# Patient Record
Sex: Female | Born: 1945
Health system: Southern US, Community
[De-identification: ages and names within clinical notes are randomized; demographics above are authoritative.]

## PROBLEM LIST (undated history)

## (undated) DIAGNOSIS — M797 Fibromyalgia: Secondary | ICD-10-CM

## (undated) DIAGNOSIS — K219 Gastro-esophageal reflux disease without esophagitis: Secondary | ICD-10-CM

## (undated) DIAGNOSIS — H3530 Unspecified macular degeneration: Secondary | ICD-10-CM

## (undated) DIAGNOSIS — M810 Age-related osteoporosis without current pathological fracture: Secondary | ICD-10-CM

## (undated) DIAGNOSIS — J45909 Unspecified asthma, uncomplicated: Secondary | ICD-10-CM

## (undated) DIAGNOSIS — H409 Unspecified glaucoma: Secondary | ICD-10-CM

## (undated) DIAGNOSIS — J3800 Paralysis of vocal cords and larynx, unspecified: Secondary | ICD-10-CM

## (undated) DIAGNOSIS — M1990 Unspecified osteoarthritis, unspecified site: Secondary | ICD-10-CM

## (undated) DIAGNOSIS — E785 Hyperlipidemia, unspecified: Secondary | ICD-10-CM

## (undated) DIAGNOSIS — C50919 Malignant neoplasm of unspecified site of unspecified female breast: Secondary | ICD-10-CM

## (undated) DIAGNOSIS — H269 Unspecified cataract: Secondary | ICD-10-CM

## (undated) DIAGNOSIS — Z9109 Other allergy status, other than to drugs and biological substances: Secondary | ICD-10-CM

## (undated) DIAGNOSIS — Z8719 Personal history of other diseases of the digestive system: Secondary | ICD-10-CM

## (undated) DIAGNOSIS — K148 Other diseases of tongue: Secondary | ICD-10-CM

## (undated) DIAGNOSIS — K635 Polyp of colon: Secondary | ICD-10-CM

## (undated) DIAGNOSIS — M8580 Other specified disorders of bone density and structure, unspecified site: Secondary | ICD-10-CM

## (undated) DIAGNOSIS — H35039 Hypertensive retinopathy, unspecified eye: Secondary | ICD-10-CM

## (undated) DIAGNOSIS — G629 Polyneuropathy, unspecified: Secondary | ICD-10-CM

## (undated) DIAGNOSIS — T7840XA Allergy, unspecified, initial encounter: Secondary | ICD-10-CM

## (undated) DIAGNOSIS — R0989 Other specified symptoms and signs involving the circulatory and respiratory systems: Secondary | ICD-10-CM

## (undated) HISTORY — DX: Unspecified cataract: H26.9

## (undated) HISTORY — DX: Unspecified glaucoma: H40.9

## (undated) HISTORY — DX: Malignant neoplasm of unspecified site of unspecified female breast: C50.919

## (undated) HISTORY — DX: Polyp of colon: K63.5

## (undated) HISTORY — PX: APPENDECTOMY: SHX54

## (undated) HISTORY — DX: Allergy, unspecified, initial encounter: T78.40XA

## (undated) HISTORY — PX: CATARACT EXTRACTION: SUR2

## (undated) HISTORY — DX: Other allergy status, other than to drugs and biological substances: Z91.09

## (undated) HISTORY — DX: Other specified symptoms and signs involving the circulatory and respiratory systems: R09.89

## (undated) HISTORY — DX: Hyperlipidemia, unspecified: E78.5

## (undated) HISTORY — DX: Age-related osteoporosis without current pathological fracture: M81.0

## (undated) HISTORY — DX: Paralysis of vocal cords and larynx, unspecified: J38.00

## (undated) HISTORY — DX: Unspecified osteoarthritis, unspecified site: M19.90

## (undated) HISTORY — DX: Hypertensive retinopathy, unspecified eye: H35.039

## (undated) HISTORY — DX: Other specified disorders of bone density and structure, unspecified site: M85.80

## (undated) HISTORY — DX: Gastro-esophageal reflux disease without esophagitis: K21.9

## (undated) HISTORY — DX: Unspecified asthma, uncomplicated: J45.909

## (undated) HISTORY — DX: Unspecified macular degeneration: H35.30

## (undated) HISTORY — DX: Other diseases of tongue: K14.8

---

## 1978-03-01 DIAGNOSIS — M1990 Unspecified osteoarthritis, unspecified site: Secondary | ICD-10-CM

## 1978-03-01 HISTORY — DX: Unspecified osteoarthritis, unspecified site: M19.90

## 1979-03-02 HISTORY — PX: TOTAL ABDOMINAL HYSTERECTOMY: SHX209

## 1979-03-02 HISTORY — PX: APPENDECTOMY: SHX54

## 1998-03-01 DIAGNOSIS — M8580 Other specified disorders of bone density and structure, unspecified site: Secondary | ICD-10-CM

## 1998-03-01 DIAGNOSIS — J45909 Unspecified asthma, uncomplicated: Secondary | ICD-10-CM

## 1998-03-01 HISTORY — DX: Unspecified asthma, uncomplicated: J45.909

## 1998-03-01 HISTORY — DX: Other specified disorders of bone density and structure, unspecified site: M85.80

## 1998-12-03 ENCOUNTER — Other Ambulatory Visit: Admission: RE | Admit: 1998-12-03 | Discharge: 1998-12-03 | Payer: Self-pay | Admitting: Obstetrics and Gynecology

## 1999-02-24 ENCOUNTER — Encounter: Admission: RE | Admit: 1999-02-24 | Discharge: 1999-02-24 | Payer: Self-pay | Admitting: Obstetrics and Gynecology

## 1999-02-24 ENCOUNTER — Encounter: Payer: Self-pay | Admitting: Obstetrics and Gynecology

## 1999-04-30 ENCOUNTER — Emergency Department (HOSPITAL_COMMUNITY): Admission: EM | Admit: 1999-04-30 | Discharge: 1999-04-30 | Payer: Self-pay | Admitting: Emergency Medicine

## 1999-05-02 ENCOUNTER — Emergency Department (HOSPITAL_COMMUNITY): Admission: EM | Admit: 1999-05-02 | Discharge: 1999-05-02 | Payer: Self-pay | Admitting: Emergency Medicine

## 1999-05-05 ENCOUNTER — Emergency Department (HOSPITAL_COMMUNITY): Admission: EM | Admit: 1999-05-05 | Discharge: 1999-05-05 | Payer: Self-pay | Admitting: Emergency Medicine

## 1999-05-08 ENCOUNTER — Emergency Department (HOSPITAL_COMMUNITY): Admission: EM | Admit: 1999-05-08 | Discharge: 1999-05-08 | Payer: Self-pay | Admitting: Emergency Medicine

## 1999-12-17 ENCOUNTER — Other Ambulatory Visit: Admission: RE | Admit: 1999-12-17 | Discharge: 1999-12-17 | Payer: Self-pay | Admitting: Obstetrics and Gynecology

## 2000-12-27 ENCOUNTER — Other Ambulatory Visit: Admission: RE | Admit: 2000-12-27 | Discharge: 2000-12-27 | Payer: Self-pay | Admitting: Obstetrics and Gynecology

## 2001-02-20 ENCOUNTER — Ambulatory Visit (HOSPITAL_COMMUNITY): Admission: RE | Admit: 2001-02-20 | Discharge: 2001-02-20 | Payer: Self-pay | Admitting: *Deleted

## 2001-12-27 ENCOUNTER — Other Ambulatory Visit: Admission: RE | Admit: 2001-12-27 | Discharge: 2001-12-27 | Payer: Self-pay | Admitting: Gynecology

## 2003-02-19 ENCOUNTER — Other Ambulatory Visit: Admission: RE | Admit: 2003-02-19 | Discharge: 2003-02-19 | Payer: Self-pay | Admitting: Gynecology

## 2003-04-22 ENCOUNTER — Encounter: Admission: RE | Admit: 2003-04-22 | Discharge: 2003-04-22 | Payer: Self-pay | Admitting: Family Medicine

## 2003-05-21 ENCOUNTER — Encounter: Admission: RE | Admit: 2003-05-21 | Discharge: 2003-05-21 | Payer: Self-pay | Admitting: Family Medicine

## 2003-09-17 ENCOUNTER — Encounter: Admission: RE | Admit: 2003-09-17 | Discharge: 2003-09-17 | Payer: Self-pay | Admitting: Family Medicine

## 2004-03-09 ENCOUNTER — Other Ambulatory Visit: Admission: RE | Admit: 2004-03-09 | Discharge: 2004-03-09 | Payer: Self-pay | Admitting: Gynecology

## 2005-05-17 ENCOUNTER — Other Ambulatory Visit: Admission: RE | Admit: 2005-05-17 | Discharge: 2005-05-17 | Payer: Self-pay | Admitting: Gynecology

## 2008-04-17 ENCOUNTER — Encounter: Admission: RE | Admit: 2008-04-17 | Discharge: 2008-04-17 | Payer: Self-pay | Admitting: Otolaryngology

## 2008-10-29 ENCOUNTER — Encounter: Admission: RE | Admit: 2008-10-29 | Discharge: 2008-10-29 | Payer: Self-pay | Admitting: Diagnostic Neuroimaging

## 2009-07-25 ENCOUNTER — Encounter: Admission: RE | Admit: 2009-07-25 | Discharge: 2009-07-25 | Payer: Self-pay | Admitting: Neurology

## 2009-10-23 ENCOUNTER — Encounter: Admission: RE | Admit: 2009-10-23 | Discharge: 2009-10-23 | Payer: Self-pay | Admitting: Diagnostic Neuroimaging

## 2010-01-14 ENCOUNTER — Encounter: Admission: RE | Admit: 2010-01-14 | Discharge: 2010-01-14 | Payer: Self-pay | Admitting: Family Medicine

## 2010-02-19 ENCOUNTER — Encounter
Admission: RE | Admit: 2010-02-19 | Discharge: 2010-02-19 | Payer: Self-pay | Source: Home / Self Care | Attending: Family Medicine | Admitting: Family Medicine

## 2010-03-17 ENCOUNTER — Encounter: Admission: RE | Admit: 2010-03-17 | Discharge: 2010-03-17 | Payer: Self-pay | Source: Home / Self Care

## 2010-03-22 ENCOUNTER — Encounter: Payer: Self-pay | Admitting: Family Medicine

## 2011-03-02 DIAGNOSIS — K219 Gastro-esophageal reflux disease without esophagitis: Secondary | ICD-10-CM

## 2011-03-02 DIAGNOSIS — T7840XA Allergy, unspecified, initial encounter: Secondary | ICD-10-CM

## 2011-03-02 DIAGNOSIS — J3800 Paralysis of vocal cords and larynx, unspecified: Secondary | ICD-10-CM

## 2011-03-02 DIAGNOSIS — G629 Polyneuropathy, unspecified: Secondary | ICD-10-CM

## 2011-03-02 DIAGNOSIS — K148 Other diseases of tongue: Secondary | ICD-10-CM

## 2011-03-02 DIAGNOSIS — Z9109 Other allergy status, other than to drugs and biological substances: Secondary | ICD-10-CM

## 2011-03-02 HISTORY — DX: Polyneuropathy, unspecified: G62.9

## 2011-03-02 HISTORY — DX: Allergy, unspecified, initial encounter: T78.40XA

## 2011-03-02 HISTORY — DX: Gastro-esophageal reflux disease without esophagitis: K21.9

## 2011-03-02 HISTORY — DX: Paralysis of vocal cords and larynx, unspecified: J38.00

## 2011-03-02 HISTORY — DX: Other diseases of tongue: K14.8

## 2011-03-02 HISTORY — DX: Other allergy status, other than to drugs and biological substances: Z91.09

## 2011-05-19 ENCOUNTER — Encounter: Payer: Self-pay | Admitting: Gastroenterology

## 2011-07-09 ENCOUNTER — Other Ambulatory Visit: Payer: Self-pay | Admitting: Gastroenterology

## 2011-08-26 ENCOUNTER — Encounter: Payer: Self-pay | Admitting: Gastroenterology

## 2011-10-13 ENCOUNTER — Other Ambulatory Visit: Payer: Self-pay | Admitting: Family Medicine

## 2011-10-13 DIAGNOSIS — R1310 Dysphagia, unspecified: Secondary | ICD-10-CM

## 2011-10-14 ENCOUNTER — Ambulatory Visit (AMBULATORY_SURGERY_CENTER): Payer: Medicare Other | Admitting: *Deleted

## 2011-10-14 VITALS — Ht 62.0 in | Wt 159.7 lb

## 2011-10-14 DIAGNOSIS — Z1211 Encounter for screening for malignant neoplasm of colon: Secondary | ICD-10-CM

## 2011-10-14 MED ORDER — MOVIPREP 100 G PO SOLR: ORAL | Status: DC

## 2011-10-21 ENCOUNTER — Other Ambulatory Visit: Payer: Self-pay

## 2011-10-28 ENCOUNTER — Encounter: Payer: Self-pay | Admitting: Gastroenterology

## 2011-10-28 ENCOUNTER — Ambulatory Visit (AMBULATORY_SURGERY_CENTER): Payer: Medicare Other | Admitting: Gastroenterology

## 2011-10-28 VITALS — BP 129/84 | HR 80 | Temp 96.9°F | Resp 18 | Ht 62.0 in | Wt 159.0 lb

## 2011-10-28 DIAGNOSIS — D126 Benign neoplasm of colon, unspecified: Secondary | ICD-10-CM

## 2011-10-28 DIAGNOSIS — Z1211 Encounter for screening for malignant neoplasm of colon: Secondary | ICD-10-CM

## 2011-10-28 MED ORDER — SODIUM CHLORIDE 0.9 % IV SOLN: 500.0000 mL | INTRAVENOUS | Status: DC

## 2011-10-28 NOTE — Progress Notes (Signed)
Patient did not experience any of the following events: a burn prior to discharge; a fall within the facility; wrong site/side/patient/procedure/implant event; or a hospital transfer or hospital admission upon discharge from the facility. (G8907) Patient did not have preoperative order for IV antibiotic SSI prophylaxis. (G8918)  

## 2011-10-28 NOTE — Patient Instructions (Addendum)
YOU HAD AN ENDOSCOPIC PROCEDURE TODAY AT THE [facility]: Refer to the procedure report that was given to you for any specific questions about what was found during the examination.  If the procedure report does not answer your questions, please call your gastroenterologist to clarify.  If you requested that your care partner not be given the details of your procedure findings, then the procedure report has been included in a sealed envelope for you to review at your convenience later.  YOU SHOULD EXPECT: Some feelings of bloating in the abdomen. Passage of more gas than usual.  Walking can help get rid of the air that was put into your GI tract during the procedure and reduce the bloating. If you had a lower endoscopy (such as a colonoscopy or flexible sigmoidoscopy) you may notice spotting of blood in your stool or on the toilet paper. If you underwent a bowel prep for your procedure, then you may not have a normal bowel movement for a few days.  DIET: Your first meal following the procedure should be a light meal and then it is ok to progress to your normal diet.  A half-sandwich or bowl of soup is an example of a good first meal.  Heavy or fried foods are harder to digest and may make you feel nauseous or bloated.  Likewise meals heavy in dairy and vegetables can cause extra gas to form and this can also increase the bloating.  Drink plenty of fluids but you should avoid alcoholic beverages for 24 hours.  ACTIVITY: Your care partner should take you home directly after the procedure.  You should plan to take it easy, moving slowly for the rest of the day.  You can resume normal activity the day after the procedure however you should NOT DRIVE or use heavy machinery for 24 hours (because of the sedation medicines used during the test).    SYMPTOMS TO REPORT IMMEDIATELY: A gastroenterologist can be reached at any hour.  During normal business hours, 8:30 AM to 5:00 PM Monday through Friday,  call [phone].  After hours and on weekends, please call the GI answering service at [phone] who will take a message and have the physician on call contact you.   Following lower endoscopy (colonoscopy or flexible sigmoidoscopy):  Excessive amounts of blood in the stool  Significant tenderness or worsening of abdominal pains  Swelling of the abdomen that is new, acute  Fever of 100F or higher   FOLLOW UP: If any biopsies were taken you will be contacted by phone or by letter within the next 1-3 weeks.  Call your gastroenterologist if you have not heard about the biopsies in 3 weeks.  Our staff will call the home number listed on your records the next business day following your procedure to check on you and address any questions or concerns that you may have at that time regarding the information given to you following your procedure. This is a courtesy call and so if there is no answer at the home number and we have not heard from you through the emergency physician on call, we will assume that you have returned to your regular daily activities without incident.  SIGNATURES/CONFIDENTIALITY: You and/or your care partner have signed paperwork which will be entered into your electronic medical record.  These signatures attest to the fact that that the information above on your After Visit Summary has been reviewed and is understood.  Full responsibility of the confidentiality of   this discharge information lies with you and/or your care-partner.   Resume medications. Information given on polyps,diverticulosis and high fiber diet with discharge instructions. 

## 2011-10-28 NOTE — Op Note (Signed)
[facility] [address] [place], [address]   COLONOSCOPY PROCEDURE REPORT  PATIENT: Ruth, Wolfe  MR#: [id number] BIRTHDATE: 1945-06-04 , 65  yrs. old GENDER: Female ENDOSCOPIST: Meryl Dare, MD, [facility] REFERRED NF:AOZHY Duaine Dredge, M.D. PROCEDURE DATE:  10/28/2011 PROCEDURE:   Colonoscopy with biopsy ASA CLASS:   Class II INDICATIONS: average risk screening. MEDICATIONS: propofol (Diprivan) 400mg  IV DESCRIPTION OF PROCEDURE:   After the risks benefits and alternatives of the procedure were thoroughly explained, informed consent was obtained.  A digital rectal exam revealed no abnormalities of the rectum.   The LB [id number]  endoscope was introduced through the anus and advanced to the cecum, which was identified by both the appendix and ileocecal valve. The sigmoid colon was tortuous, fixed and was moderately difficult to traverse. No adverse events experienced.   The quality of the prep was good, using MoviPrep  The instrument was then slowly withdrawn as the colon was fully examined.   COLON FINDINGS: A sessile polyp was found in the ascending colon.  A polypectomy was performed with cold forceps.  The resection was complete and the polyp tissue was completely retrieved.   Moderate diverticulosis and tortuosity was noted in the sigmoid colon.   The colon mucosa was otherwise normal.  Retroflexed views revealed small internal hemorrhoids. The time to cecum=13 minutes 13 seconds.  Withdrawal time=10 minutes 00 seconds.  The scope was withdrawn and the procedure completed. COMPLICATIONS: There were no complications.  ENDOSCOPIC IMPRESSION: 1.   Sessile polyp was found in the ascending colon; polypectomy was performed with cold forceps 2.   Moderate diverticulosis was noted in the sigmoid colon 3.   The colon mucosa was otherwise normal  RECOMMENDATIONS: 1.  Await pathology results 2.  High fiber diet with liberal fluid intake 3.  Repeat  colonoscopy in 5 years if polyp adenomatous; otherwise 10 years and consider VC given sigmoid colon anatomy   eSigned:  Meryl Dare, MD, [facility] 10/28/2011 10:30 AM

## 2011-10-28 NOTE — Progress Notes (Signed)
Propofol per m smith crna, all meds titrated per crna during procedure. See scanned intra procedure report. ewm 

## 2011-10-29 ENCOUNTER — Telehealth: Payer: Self-pay | Admitting: *Deleted

## 2011-10-29 NOTE — Telephone Encounter (Signed)
  Follow up Call-  Call back number 10/28/2011  Post procedure Call Back phone  # [phone]  Permission to leave phone message Yes     Patient questions:  Do you have a fever, pain , or abdominal swelling? no Pain Score  0 *  Have you tolerated food without any problems? yes  Have you been able to return to your normal activities? yes  Do you have any questions about your discharge instructions: Diet   no Medications  no Follow up visit  yes  Do you have questions or concerns about your Care? yes  Actions: * If pain score is 4 or above: No action needed, pain <4.  Pt had questions about recommendations on her proc report.  Explained what a tortuous colon is and discussed virtual colonoscopy.

## 2011-11-04 ENCOUNTER — Encounter: Payer: Self-pay | Admitting: Gastroenterology

## 2011-11-18 ENCOUNTER — Ambulatory Visit
Admission: RE | Admit: 2011-11-18 | Discharge: 2011-11-18 | Disposition: A | Discharge status: Home / Self Care | Payer: Medicare Other | Source: Ambulatory Visit | Attending: Family Medicine | Admitting: Family Medicine

## 2011-11-18 DIAGNOSIS — R1310 Dysphagia, unspecified: Secondary | ICD-10-CM

## 2011-12-06 DIAGNOSIS — J3800 Paralysis of vocal cords and larynx, unspecified: Secondary | ICD-10-CM | POA: Insufficient documentation

## 2011-12-06 DIAGNOSIS — R479 Unspecified speech disturbances: Secondary | ICD-10-CM | POA: Insufficient documentation

## 2012-02-28 ENCOUNTER — Other Ambulatory Visit: Payer: Self-pay | Admitting: Family Medicine

## 2012-02-28 DIAGNOSIS — G527 Disorders of multiple cranial nerves: Secondary | ICD-10-CM

## 2012-03-01 DIAGNOSIS — E785 Hyperlipidemia, unspecified: Secondary | ICD-10-CM

## 2012-03-01 DIAGNOSIS — H3530 Unspecified macular degeneration: Secondary | ICD-10-CM

## 2012-03-01 HISTORY — DX: Unspecified macular degeneration: H35.30

## 2012-03-01 HISTORY — DX: Hyperlipidemia, unspecified: E78.5

## 2012-03-03 ENCOUNTER — Ambulatory Visit
Admission: RE | Admit: 2012-03-03 | Discharge: 2012-03-03 | Disposition: A | Discharge status: Home / Self Care | Payer: Medicare Other | Source: Ambulatory Visit | Attending: Family Medicine | Admitting: Family Medicine

## 2012-03-03 DIAGNOSIS — G527 Disorders of multiple cranial nerves: Secondary | ICD-10-CM

## 2012-03-03 MED ORDER — GADOBENATE DIMEGLUMINE 529 MG/ML IV SOLN
14.0000 mL | Freq: Once | INTRAVENOUS | Status: AC | PRN
  Administered 2012-03-03: 14 mL via INTRAVENOUS

## 2012-10-05 ENCOUNTER — Ambulatory Visit (INDEPENDENT_AMBULATORY_CARE_PROVIDER_SITE_OTHER): Payer: Medicare Other | Admitting: Family Medicine

## 2012-10-05 VITALS — BP 130/76 | HR 84 | Temp 97.9°F | Resp 16 | Ht 62.3 in | Wt 154.2 lb

## 2012-10-05 DIAGNOSIS — H6123 Impacted cerumen, bilateral: Secondary | ICD-10-CM

## 2012-10-05 DIAGNOSIS — H9193 Unspecified hearing loss, bilateral: Secondary | ICD-10-CM

## 2012-10-05 NOTE — Progress Notes (Signed)
[facility]:  Office Visit  Chief Complaint:  Chief Complaint  Patient presents with  . Cerumen Impaction    has been using debrox but it seems like it has made it worse    HPI: Ruth Wolfe is a 67 y.o. female who complains of  Right ear and also Left ear wax, right ear hurts, tried debrox for 2 weeks without relief. No other sxs. .   Past Medical History  Diagnosis Date  . Asthma   . Environmental allergies   . Hyperlipidemia   . GERD (gastroesophageal reflux disease)   . Glaucoma     pre-Glaucoma  . Osteopenia    Past Surgical History  Procedure Laterality Date  . Total abdominal hysterectomy  1981   History   Social History  . Marital Status: Divorced    Spouse Name: N/A    Number of Children: N/A  . Years of Education: N/A   Social History Main Topics  . Smoking status: Never Smoker   . Smokeless tobacco: Never Used  . Alcohol Use: 3 - 3.6 oz/week    5-6 Cans of beer per week  . Drug Use: No  . Sexually Active: None   Other Topics Concern  . None   Social History Narrative  . None   Family History  Problem Relation Age of Onset  . Colon cancer Neg Hx   . Stomach cancer Neg Hx   . Lung cancer Father    Allergies  Allergen Reactions  . Sulfa Antibiotics     Wheezing, swelling face and ears   Prior to Admission medications   Medication Sig Start Date End Date Taking? Authorizing Provider  albuterol (PROAIR HFA) 108 (90 BASE) MCG/ACT inhaler Inhale 2 puffs into the lungs every 6 (six) hours as needed for wheezing.   Yes Historical Provider, MD  aspirin 81 MG tablet Take 81 mg by mouth daily.   Yes Historical Provider, MD  Cholecalciferol (VITAMIN D) 2000 UNITS CAPS Take by mouth daily.   Yes Historical Provider, MD  latanoprost (XALATAN) 0.005 % ophthalmic solution Place 1 drop into both eyes at bedtime.  10/07/11  Yes Historical Provider, MD  Omega-3 Fatty Acids (FISH OIL) 1200 MG CAPS Take by mouth 2 (two) times daily.   Yes  Historical Provider, MD  PULMICORT FLEXHALER 180 MCG/ACT inhaler Inhale 1 puff into the lungs 2 (two) times daily.  08/18/11  Yes Historical Provider, MD  simvastatin (ZOCOR) 80 MG tablet Take 80 mg by mouth at bedtime.   Yes Historical Provider, MD  pravastatin (PRAVACHOL) 80 MG tablet Take 80 mg by mouth daily.  09/08/11   Historical Provider, MD  UNABLE TO FIND Med Name: Macular Degeneration Supplement. 1 tablet twice daily    Historical Provider, MD  VENTOLIN HFA 108 (90 BASE) MCG/ACT inhaler Inhale 2 puffs into the lungs 2 (two) times daily.  08/05/11   Historical Provider, MD     ROS: The patient denies fevers, chills, night sweats, unintentional weight loss, chest pain, palpitations, wheezing, dyspnea on exertion, nausea, vomiting, abdominal pain, dysuria, hematuria, melena, numbness, weakness, or tingling.  All other systems have been reviewed and were otherwise negative with the exception of those mentioned in the HPI and as above.    PHYSICAL EXAM: Filed Vitals:   10/05/12 0903  BP: 130/76  Pulse: 84  Temp: 97.9 F (36.6 C)  Resp: 16   Filed Vitals:   10/05/12 0903  Height: 5' 2.3" (1.582 m)  Weight: 154 lb 3.2 oz (69.945 kg)   Body mass index is 27.95 kg/(m^2).  General: Alert, no acute distress HEENT:  Normocephalic, atraumatic, oropharynx patent. bilaterl cermen obscuring TM Cardiovascular:  Regular rate and rhythm, no rubs murmurs or gallops.  No Carotid bruits, radial pulse intact. No pedal edema.  Respiratory: Clear to auscultation bilaterally.  No wheezes, rales, or rhonchi.  No cyanosis, no use of accessory musculature GI: No organomegaly, abdomen is soft and non-tender, positive bowel sounds.  No masses. Skin: No rashes. Neurologic: Facial musculature symmetric. Psychiatric: Patient is appropriate throughout our interaction. Lymphatic: No cervical lymphadenopathy Musculoskeletal: Gait intact.   LABS: No results found for this or any previous  visit.   EKG/XRAY:   Primary read interpreted by Dr. Conley Rolls at [facility].   ASSESSMENT/PLAN: Encounter Diagnoses  Name Primary?  . Cerumen impaction, bilateral Yes  . Hearing loss, bilateral    Whisper test normal after cerumen disimpaction TM was visualized Cerumen removed successfully F/u prn    Anabeth Chilcott PHUONG, DO 10/05/2012 9:41 AM

## 2012-10-26 ENCOUNTER — Ambulatory Visit (INDEPENDENT_AMBULATORY_CARE_PROVIDER_SITE_OTHER): Payer: Medicare Other | Admitting: Family Medicine

## 2012-10-26 VITALS — BP 122/82 | HR 96 | Temp 98.1°F | Resp 16 | Ht 62.0 in | Wt 151.0 lb

## 2012-10-26 DIAGNOSIS — L237 Allergic contact dermatitis due to plants, except food: Secondary | ICD-10-CM

## 2012-10-26 DIAGNOSIS — L255 Unspecified contact dermatitis due to plants, except food: Secondary | ICD-10-CM

## 2012-10-26 MED ORDER — METHYLPREDNISOLONE ACETATE 80 MG/ML IJ SUSP
80.0000 mg | Freq: Once | INTRAMUSCULAR | Status: AC
  Administered 2012-10-26: 80 mg via INTRAMUSCULAR

## 2012-10-26 MED ORDER — PREDNISONE 10 MG PO TABS: ORAL_TABLET | ORAL | Status: DC

## 2012-10-26 NOTE — Progress Notes (Signed)
[facility]:  Office Visit  Chief Complaint:  Chief Complaint  Patient presents with  . Poison Ivy    HPI: Ruth Wolfe is a 67 y.o. female who complains of here for posion ivy on face and on finger, has tried sea breeze and also nozema without relief of itching, alos on the back of her ear and right eyelid.  She deneis SOB, CP, wheezing, changes.  She has pre-glaucoma, her eye pressure is 12 and she does not have glaucoma  Past Medical History  Diagnosis Date  . Asthma   . Environmental allergies   . Hyperlipidemia   . GERD (gastroesophageal reflux disease)   . Glaucoma     pre-Glaucoma  . Osteopenia   . Allergy    Past Surgical History  Procedure Laterality Date  . Total abdominal hysterectomy  1981  . Appendectomy     History   Social History  . Marital Status: Divorced    Spouse Name: N/A    Number of Children: N/A  . Years of Education: N/A   Social History Main Topics  . Smoking status: Never Smoker   . Smokeless tobacco: Never Used  . Alcohol Use: 3 - 3.6 oz/week    5-6 Cans of beer per week  . Drug Use: No  . Sexual Activity: None   Other Topics Concern  . None   Social History Narrative  . None   Family History  Problem Relation Age of Onset  . Colon cancer Neg Hx   . Stomach cancer Neg Hx   . Lung cancer Father    Allergies  Allergen Reactions  . Sulfa Antibiotics     Wheezing, swelling face and ears   Prior to Admission medications   Medication Sig Start Date End Date Taking? Authorizing Provider  albuterol (PROAIR HFA) 108 (90 BASE) MCG/ACT inhaler Inhale 2 puffs into the lungs every 6 (six) hours as needed for wheezing.   Yes Historical Provider, MD  aspirin 81 MG tablet Take 81 mg by mouth daily.   Yes Historical Provider, MD  Cholecalciferol (VITAMIN D) 2000 UNITS CAPS Take by mouth daily.   Yes Historical Provider, MD  latanoprost (XALATAN) 0.005 % ophthalmic solution Place 1 drop into both eyes at bedtime.   10/07/11  Yes Historical Provider, MD  Omega-3 Fatty Acids (FISH OIL) 1200 MG CAPS Take by mouth 2 (two) times daily.   Yes Historical Provider, MD  PULMICORT FLEXHALER 180 MCG/ACT inhaler Inhale 1 puff into the lungs 2 (two) times daily.  08/18/11  Yes Historical Provider, MD  simvastatin (ZOCOR) 80 MG tablet Take 80 mg by mouth at bedtime.   Yes Historical Provider, MD  UNABLE TO FIND Med Name: Macular Degeneration Supplement. 1 tablet twice daily   Yes Historical Provider, MD  pravastatin (PRAVACHOL) 80 MG tablet Take 80 mg by mouth daily.  09/08/11   Historical Provider, MD  VENTOLIN HFA 108 (90 BASE) MCG/ACT inhaler Inhale 2 puffs into the lungs 2 (two) times daily.  08/05/11   Historical Provider, MD     ROS: The patient denies fevers, chills, night sweats, unintentional weight loss, chest pain, palpitations, wheezing, dyspnea on exertion, nausea, vomiting, abdominal pain, dysuria, hematuria, melena, numbness, weakness, or tingling.   All other systems have been reviewed and were otherwise negative with the exception of those mentioned in the HPI and as above.    PHYSICAL EXAM: Filed Vitals:   10/26/12 0822  BP: 122/82  Pulse: 96  Temp: 98.1 F (36.7 C)  Resp: 16   Filed Vitals:   10/26/12 0822  Height: 5\' 2"  (1.575 m)  Weight: 151 lb (68.493 kg)   Body mass index is 27.61 kg/(m^2).  General: Alert, no acute distress HEENT:  Normocephalic, atraumatic, oropharynx patent. EOMI, PERRLA. Airway is open Cardiovascular:  Regular rate and rhythm, no rubs murmurs or gallops.  No Carotid bruits, radial pulse intact. No pedal edema.  Respiratory: Clear to auscultation bilaterally.  No wheezes, rales, or rhonchi.  No cyanosis, no use of accessory musculature GI: No organomegaly, abdomen is soft and non-tender, positive bowel sounds.  No masses. Skin: + erythematous rash on right side of face, edema and also behind ears and on left hand Neurologic: Facial musculature symmetric. Psychiatric:  Patient is appropriate throughout our interaction. Lymphatic: No cervical lymphadenopathy Musculoskeletal: Gait intact.   LABS: No results found for this or any previous visit.   EKG/XRAY:   Primary read interpreted by Dr. Conley Rolls at [facility].   ASSESSMENT/PLAN: Encounter Diagnosis  Name Primary?  . Poison ivy dermatitis Yes   Patient given Depomedrol 80 mg IM x 1 Antihistamine Zyrtec/Benadryl Rx Prednisone taper ( low dose)  She will go see her optometerist for check on her eyes next week as scheduled  Gross sideeffects, risk and benefits, and alternatives of medications d/w patient. Patient is aware that all medications have potential sideeffects and we are unable to predict every sideeffect or drug-drug interaction that may occur.  Hamilton Capri PHUONG, DO 10/26/2012 9:54 AM

## 2012-10-26 NOTE — Patient Instructions (Signed)
IF YOU ARE GOING OUT AND DO NOT WANT TO BE DROWSY--- TAKE ZYRTEC 10 mg DAILY; YOU MAY ALSO TAKE BENADRYL 25 mg at NIGHT AS NEEDED   Poison [facility] ivy is a inflammation of the skin (contact dermatitis) caused by touching the allergens on the leaves of the ivy plant following previous exposure to the plant. The rash usually appears 48 hours after exposure. The rash is usually bumps (papules) or blisters (vesicles) in a linear pattern. Depending on your own sensitivity, the rash may simply cause redness and itching, or it may also progress to blisters which may break open. These must be well cared for to prevent secondary bacterial (germ) infection, followed by scarring. Keep any open areas dry, clean, dressed, and covered with an antibacterial ointment if needed. The eyes may also get puffy. The puffiness is worst in the morning and gets better as the day progresses. This dermatitis usually heals without scarring, within 2 to 3 weeks without treatment. HOME CARE INSTRUCTIONS  Thoroughly wash with soap and water as soon as you have been exposed to poison ivy. You have about one half hour to remove the plant resin before it will cause the rash. This washing will destroy the oil or antigen on the skin that is causing, or will cause, the rash. Be sure to wash under your fingernails as any plant resin there will continue to spread the rash. Do not rub skin vigorously when washing affected area. Poison ivy cannot spread if no oil from the plant remains on your body. A rash that has progressed to weeping sores will not spread the rash unless you have not washed thoroughly. It is also important to wash any clothes you have been wearing as these may carry active allergens. The rash will return if you wear the unwashed clothing, even several days later. Avoidance of the plant in the future is the best measure. Poison ivy plant can be recognized by the number of leaves. Generally, poison ivy has three leaves with  flowering branches on a single stem. Diphenhydramine may be purchased over the counter and used as needed for itching. Do not drive with this medication if it makes you drowsy.Ask your caregiver about medication for children. SEEK MEDICAL CARE IF:  Open sores develop.  Redness spreads beyond area of rash.  You notice purulent (pus-like) discharge.  You have increased pain.  Other signs of infection develop (such as fever). Document Released: 02/13/2000 Document Revised: 05/10/2011 Document Reviewed: 01/01/2009 [facility] Patient Information 2014 [place], [place].

## 2013-04-20 DIAGNOSIS — H04129 Dry eye syndrome of unspecified lacrimal gland: Secondary | ICD-10-CM | POA: Diagnosis not present

## 2013-04-20 DIAGNOSIS — H4011X0 Primary open-angle glaucoma, stage unspecified: Secondary | ICD-10-CM | POA: Diagnosis not present

## 2013-04-20 DIAGNOSIS — H409 Unspecified glaucoma: Secondary | ICD-10-CM | POA: Diagnosis not present

## 2013-05-09 DIAGNOSIS — Z1231 Encounter for screening mammogram for malignant neoplasm of breast: Secondary | ICD-10-CM | POA: Diagnosis not present

## 2013-05-15 DIAGNOSIS — IMO0001 Reserved for inherently not codable concepts without codable children: Secondary | ICD-10-CM | POA: Diagnosis not present

## 2013-05-15 DIAGNOSIS — E782 Mixed hyperlipidemia: Secondary | ICD-10-CM | POA: Diagnosis not present

## 2013-05-15 DIAGNOSIS — J45909 Unspecified asthma, uncomplicated: Secondary | ICD-10-CM | POA: Diagnosis not present

## 2013-05-15 DIAGNOSIS — R0989 Other specified symptoms and signs involving the circulatory and respiratory systems: Secondary | ICD-10-CM | POA: Diagnosis not present

## 2013-05-15 DIAGNOSIS — Z23 Encounter for immunization: Secondary | ICD-10-CM | POA: Diagnosis not present

## 2013-07-06 DIAGNOSIS — J45901 Unspecified asthma with (acute) exacerbation: Secondary | ICD-10-CM | POA: Diagnosis not present

## 2013-07-06 DIAGNOSIS — J029 Acute pharyngitis, unspecified: Secondary | ICD-10-CM | POA: Diagnosis not present

## 2013-07-06 DIAGNOSIS — R0982 Postnasal drip: Secondary | ICD-10-CM | POA: Diagnosis not present

## 2013-09-17 DIAGNOSIS — R0989 Other specified symptoms and signs involving the circulatory and respiratory systems: Secondary | ICD-10-CM | POA: Diagnosis not present

## 2013-09-17 DIAGNOSIS — E782 Mixed hyperlipidemia: Secondary | ICD-10-CM | POA: Diagnosis not present

## 2013-09-17 DIAGNOSIS — J45909 Unspecified asthma, uncomplicated: Secondary | ICD-10-CM | POA: Diagnosis not present

## 2013-09-17 DIAGNOSIS — IMO0001 Reserved for inherently not codable concepts without codable children: Secondary | ICD-10-CM | POA: Diagnosis not present

## 2013-10-29 DIAGNOSIS — H4011X0 Primary open-angle glaucoma, stage unspecified: Secondary | ICD-10-CM | POA: Diagnosis not present

## 2013-10-29 DIAGNOSIS — H353190 Nonexudative age-related macular degeneration, unspecified eye, stage unspecified: Secondary | ICD-10-CM | POA: Diagnosis not present

## 2013-10-29 DIAGNOSIS — H409 Unspecified glaucoma: Secondary | ICD-10-CM | POA: Diagnosis not present

## 2013-10-29 DIAGNOSIS — H524 Presbyopia: Secondary | ICD-10-CM | POA: Diagnosis not present

## 2013-11-19 DIAGNOSIS — E782 Mixed hyperlipidemia: Secondary | ICD-10-CM | POA: Diagnosis not present

## 2014-01-07 DIAGNOSIS — J4540 Moderate persistent asthma, uncomplicated: Secondary | ICD-10-CM | POA: Diagnosis not present

## 2014-01-07 DIAGNOSIS — Z6827 Body mass index (BMI) 27.0-27.9, adult: Secondary | ICD-10-CM | POA: Diagnosis not present

## 2014-01-07 DIAGNOSIS — G2581 Restless legs syndrome: Secondary | ICD-10-CM | POA: Diagnosis not present

## 2014-01-07 DIAGNOSIS — E782 Mixed hyperlipidemia: Secondary | ICD-10-CM | POA: Diagnosis not present

## 2014-01-16 DIAGNOSIS — E782 Mixed hyperlipidemia: Secondary | ICD-10-CM | POA: Diagnosis not present

## 2014-01-16 DIAGNOSIS — Z0000 Encounter for general adult medical examination without abnormal findings: Secondary | ICD-10-CM | POA: Diagnosis not present

## 2014-01-16 DIAGNOSIS — G2581 Restless legs syndrome: Secondary | ICD-10-CM | POA: Diagnosis not present

## 2014-01-16 DIAGNOSIS — J4540 Moderate persistent asthma, uncomplicated: Secondary | ICD-10-CM | POA: Diagnosis not present

## 2014-01-17 DIAGNOSIS — E2839 Other primary ovarian failure: Secondary | ICD-10-CM | POA: Diagnosis not present

## 2014-01-17 DIAGNOSIS — M8588 Other specified disorders of bone density and structure, other site: Secondary | ICD-10-CM | POA: Diagnosis not present

## 2014-02-20 DIAGNOSIS — Z85828 Personal history of other malignant neoplasm of skin: Secondary | ICD-10-CM | POA: Diagnosis not present

## 2014-02-20 DIAGNOSIS — L814 Other melanin hyperpigmentation: Secondary | ICD-10-CM | POA: Diagnosis not present

## 2014-02-20 DIAGNOSIS — L821 Other seborrheic keratosis: Secondary | ICD-10-CM | POA: Diagnosis not present

## 2014-02-20 DIAGNOSIS — D1801 Hemangioma of skin and subcutaneous tissue: Secondary | ICD-10-CM | POA: Diagnosis not present

## 2014-02-20 DIAGNOSIS — D239 Other benign neoplasm of skin, unspecified: Secondary | ICD-10-CM | POA: Diagnosis not present

## 2014-02-20 DIAGNOSIS — L570 Actinic keratosis: Secondary | ICD-10-CM | POA: Diagnosis not present

## 2014-03-01 DIAGNOSIS — H409 Unspecified glaucoma: Secondary | ICD-10-CM

## 2014-03-01 HISTORY — DX: Unspecified glaucoma: H40.9

## 2014-04-24 DIAGNOSIS — H409 Unspecified glaucoma: Secondary | ICD-10-CM | POA: Diagnosis not present

## 2014-04-24 DIAGNOSIS — H4011X0 Primary open-angle glaucoma, stage unspecified: Secondary | ICD-10-CM | POA: Diagnosis not present

## 2014-05-03 DIAGNOSIS — D485 Neoplasm of uncertain behavior of skin: Secondary | ICD-10-CM | POA: Diagnosis not present

## 2014-05-03 DIAGNOSIS — H6122 Impacted cerumen, left ear: Secondary | ICD-10-CM | POA: Diagnosis not present

## 2014-05-06 DIAGNOSIS — L905 Scar conditions and fibrosis of skin: Secondary | ICD-10-CM | POA: Diagnosis not present

## 2014-05-10 ENCOUNTER — Ambulatory Visit (INDEPENDENT_AMBULATORY_CARE_PROVIDER_SITE_OTHER): Payer: Medicare Other | Admitting: Family Medicine

## 2014-05-10 VITALS — BP 112/70 | HR 75 | Temp 98.2°F | Resp 18 | Ht 62.0 in | Wt 160.2 lb

## 2014-05-10 DIAGNOSIS — H6120 Impacted cerumen, unspecified ear: Secondary | ICD-10-CM | POA: Diagnosis not present

## 2014-05-10 DIAGNOSIS — J069 Acute upper respiratory infection, unspecified: Secondary | ICD-10-CM | POA: Diagnosis not present

## 2014-05-10 NOTE — Progress Notes (Signed)
[facility] [address], [place] [address] [phone]  Date:  05/10/2014   Name:  Ruth Wolfe   DOB:  01-Dec-1945   MRN:  [id number]  PCP:  Marylene Land, MD    Chief Complaint: Facial Pain and Ear Fullness   History of Present Illness:  Ruth Wolfe is a 69 y.o. very pleasant female patient who presents with the following:  Seen here a couple of years ago with cerumen impaction.  Today she would like to have her ears flushed, and she notes a hoarse voice and scratchy throat for about 3 days. She has not noted a fever.  Wonders if this is a cold or allergies The cough is dry, "just a tickle."   No GI symptoms.   She is generally in good health.  She is using flonase, zyrtec.    There are no active problems to display for this patient.   Past Medical History  Diagnosis Date  . Asthma   . Environmental allergies   . Hyperlipidemia   . GERD (gastroesophageal reflux disease)   . Glaucoma     pre-Glaucoma  . Osteopenia   . Allergy     Past Surgical History  Procedure Laterality Date  . Total abdominal hysterectomy  1981  . Appendectomy      History  Substance Use Topics  . Smoking status: Never Smoker   . Smokeless tobacco: Never Used  . Alcohol Use: 3.0 - 3.6 oz/week    5-6 Cans of beer per week    Family History  Problem Relation Age of Onset  . Colon cancer Neg Hx   . Stomach cancer Neg Hx   . Lung cancer Father     Allergies  Allergen Reactions  . Sulfa Antibiotics     Wheezing, swelling face and ears    Medication list has been reviewed and updated.  Current Outpatient Prescriptions on File Prior to Visit  Medication Sig Dispense Refill  . Cholecalciferol (VITAMIN D) 2000 UNITS CAPS Take by mouth daily.    Marland Kitchen latanoprost (XALATAN) 0.005 % ophthalmic solution Place 1 drop into both eyes at bedtime.     Marland Kitchen UNABLE TO FIND Med Name: Macular Degeneration Supplement. 1 tablet twice daily    . albuterol (PROAIR HFA) 108  (90 BASE) MCG/ACT inhaler Inhale 2 puffs into the lungs every 6 (six) hours as needed for wheezing.     No current facility-administered medications on file prior to visit.    Review of Systems:  As per HPI- otherwise negative.   Physical Examination: Filed Vitals:   05/10/14 0903  BP: 112/70  Pulse: 75  Temp: 98.2 F (36.8 C)  Resp: 18   Filed Vitals:   05/10/14 0903  Height: 5\' 2"  (1.575 m)  Weight: 160 lb 3.2 oz (72.666 kg)   Body mass index is 29.29 kg/(m^2). Ideal Body Weight: Weight in (lb) to have BMI = 25: 136.4  GEN: WDWN, NAD, Non-toxic, A & O x 3, looks well, mild overweight HEENT: Atraumatic, Normocephalic. Neck supple. No masses, No LAD.  Oropharynx normal. Wax in ears, right more than left.   PEERL,EOMI.  Nasal cavity normal, no sinus tenderness  Ears and Nose: No external deformity. CV: RRR, No M/G/R. No JVD. No thrill. No extra heart sounds. PULM: CTA B, no wheezes, crackles, rhonchi. No retractions. No resp. distress. No accessory muscle use. EXTR: No c/c/e NEURO Normal gait.  PSYCH: Normally interactive. Conversant. Not depressed or anxious  appearing.  Calm demeanor.   Ears flushed by Lockie Pares with resolution of cerumen impaction bilaterally. She felt better, TM wnl  Assessment and Plan: Cerumen impaction, unspecified laterality - Plan: Ear Lavage  Viral URI  Possible allergies vs a viral cold. She will continue zyrtec and flonase, let me know if worsening or other concerns Ears lavaged as above with good results   Signed Lamar Blinks, MD

## 2014-05-10 NOTE — Patient Instructions (Signed)
Try zyrtec for your cold or allergy symptoms Let me know if you are not better soon- Sooner if worse.

## 2014-05-14 DIAGNOSIS — Z1231 Encounter for screening mammogram for malignant neoplasm of breast: Secondary | ICD-10-CM | POA: Diagnosis not present

## 2014-06-04 DIAGNOSIS — J302 Other seasonal allergic rhinitis: Secondary | ICD-10-CM | POA: Diagnosis not present

## 2014-06-04 DIAGNOSIS — Z8709 Personal history of other diseases of the respiratory system: Secondary | ICD-10-CM | POA: Diagnosis not present

## 2014-06-04 DIAGNOSIS — R05 Cough: Secondary | ICD-10-CM | POA: Diagnosis not present

## 2014-07-15 DIAGNOSIS — E782 Mixed hyperlipidemia: Secondary | ICD-10-CM | POA: Diagnosis not present

## 2014-07-18 DIAGNOSIS — G2581 Restless legs syndrome: Secondary | ICD-10-CM | POA: Diagnosis not present

## 2014-07-18 DIAGNOSIS — Z6827 Body mass index (BMI) 27.0-27.9, adult: Secondary | ICD-10-CM | POA: Diagnosis not present

## 2014-07-18 DIAGNOSIS — J4540 Moderate persistent asthma, uncomplicated: Secondary | ICD-10-CM | POA: Diagnosis not present

## 2014-07-18 DIAGNOSIS — E782 Mixed hyperlipidemia: Secondary | ICD-10-CM | POA: Diagnosis not present

## 2014-07-23 DIAGNOSIS — J209 Acute bronchitis, unspecified: Secondary | ICD-10-CM | POA: Diagnosis not present

## 2014-07-23 DIAGNOSIS — J45901 Unspecified asthma with (acute) exacerbation: Secondary | ICD-10-CM | POA: Diagnosis not present

## 2014-11-01 DIAGNOSIS — H16229 Keratoconjunctivitis sicca, not specified as Sjogren's, unspecified eye: Secondary | ICD-10-CM | POA: Diagnosis not present

## 2014-11-01 DIAGNOSIS — H3531 Nonexudative age-related macular degeneration: Secondary | ICD-10-CM | POA: Diagnosis not present

## 2014-11-01 DIAGNOSIS — H4011X0 Primary open-angle glaucoma, stage unspecified: Secondary | ICD-10-CM | POA: Diagnosis not present

## 2014-11-01 DIAGNOSIS — H01003 Unspecified blepharitis right eye, unspecified eyelid: Secondary | ICD-10-CM | POA: Diagnosis not present

## 2015-01-08 DIAGNOSIS — Z23 Encounter for immunization: Secondary | ICD-10-CM | POA: Diagnosis not present

## 2015-02-25 DIAGNOSIS — D225 Melanocytic nevi of trunk: Secondary | ICD-10-CM | POA: Diagnosis not present

## 2015-02-25 DIAGNOSIS — D692 Other nonthrombocytopenic purpura: Secondary | ICD-10-CM | POA: Diagnosis not present

## 2015-02-25 DIAGNOSIS — D1800 Hemangioma unspecified site: Secondary | ICD-10-CM | POA: Diagnosis not present

## 2015-02-25 DIAGNOSIS — Z85828 Personal history of other malignant neoplasm of skin: Secondary | ICD-10-CM | POA: Diagnosis not present

## 2015-02-25 DIAGNOSIS — Z23 Encounter for immunization: Secondary | ICD-10-CM | POA: Diagnosis not present

## 2015-02-25 DIAGNOSIS — L821 Other seborrheic keratosis: Secondary | ICD-10-CM | POA: Diagnosis not present

## 2015-02-25 DIAGNOSIS — L814 Other melanin hyperpigmentation: Secondary | ICD-10-CM | POA: Diagnosis not present

## 2015-02-25 DIAGNOSIS — L570 Actinic keratosis: Secondary | ICD-10-CM | POA: Diagnosis not present

## 2015-03-17 DIAGNOSIS — E7800 Pure hypercholesterolemia, unspecified: Secondary | ICD-10-CM | POA: Diagnosis not present

## 2015-03-17 DIAGNOSIS — E559 Vitamin D deficiency, unspecified: Secondary | ICD-10-CM | POA: Diagnosis not present

## 2015-03-17 DIAGNOSIS — Z0000 Encounter for general adult medical examination without abnormal findings: Secondary | ICD-10-CM | POA: Diagnosis not present

## 2015-03-17 DIAGNOSIS — Z01419 Encounter for gynecological examination (general) (routine) without abnormal findings: Secondary | ICD-10-CM | POA: Diagnosis not present

## 2015-04-04 ENCOUNTER — Ambulatory Visit (INDEPENDENT_AMBULATORY_CARE_PROVIDER_SITE_OTHER): Payer: Medicare Other | Admitting: Physician Assistant

## 2015-04-04 ENCOUNTER — Ambulatory Visit (INDEPENDENT_AMBULATORY_CARE_PROVIDER_SITE_OTHER): Payer: Medicare Other

## 2015-04-04 VITALS — BP 110/70 | HR 99 | Temp 99.0°F | Resp 20 | Ht 62.6 in | Wt 153.4 lb

## 2015-04-04 DIAGNOSIS — R221 Localized swelling, mass and lump, neck: Secondary | ICD-10-CM

## 2015-04-04 DIAGNOSIS — R05 Cough: Secondary | ICD-10-CM

## 2015-04-04 DIAGNOSIS — R0981 Nasal congestion: Secondary | ICD-10-CM | POA: Diagnosis not present

## 2015-04-04 DIAGNOSIS — R062 Wheezing: Secondary | ICD-10-CM

## 2015-04-04 DIAGNOSIS — R059 Cough, unspecified: Secondary | ICD-10-CM

## 2015-04-04 LAB — POCT CBC
Granulocyte percent: 62.1 %G (ref 37–80)
HCT, POC: 40.2 % (ref 37.7–47.9)
Hemoglobin: 13.8 g/dL (ref 12.2–16.2)
Lymph, poc: 1.5 (ref 0.6–3.4)
MCH, POC: 32.8 pg — AB (ref 27–31.2)
MCHC: 34.3 g/dL (ref 31.8–35.4)
MCV: 95.6 fL (ref 80–97)
MID (cbc): 0.7 (ref 0–0.9)
MPV: 6.2 fL (ref 0–99.8)
POC Granulocyte: 3.5 (ref 2–6.9)
POC LYMPH PERCENT: 26.2 %L (ref 10–50)
POC MID %: 11.7 %M (ref 0–12)
Platelet Count, POC: 215 10*3/uL (ref 142–424)
RBC: 4.21 M/uL (ref 4.04–5.48)
RDW, POC: 12.7 %
WBC: 5.7 10*3/uL (ref 4.6–10.2)

## 2015-04-04 LAB — GLUCOSE, POCT (MANUAL RESULT ENTRY): POC Glucose: 100 mg/dl — AB (ref 70–99)

## 2015-04-04 LAB — TSH: TSH: 2.351 u[IU]/mL (ref 0.350–4.500)

## 2015-04-04 MED ORDER — IPRATROPIUM BROMIDE 0.02 % IN SOLN
0.5000 mg | Freq: Once | RESPIRATORY_TRACT | Status: AC
  Administered 2015-04-04: 0.5 mg via RESPIRATORY_TRACT

## 2015-04-04 MED ORDER — PREDNISONE 20 MG PO TABS: ORAL_TABLET | ORAL | Status: AC

## 2015-04-04 MED ORDER — ALBUTEROL SULFATE (2.5 MG/3ML) 0.083% IN NEBU
2.5000 mg | INHALATION_SOLUTION | Freq: Once | RESPIRATORY_TRACT | Status: AC
Start: 2015-04-04 — End: 2015-04-04
  Administered 2015-04-04: 2.5 mg via RESPIRATORY_TRACT

## 2015-04-04 MED ORDER — HYDROCODONE-HOMATROPINE 5-1.5 MG/5ML PO SYRP: 2.5000 mL | ORAL_SOLUTION | Freq: Every evening | ORAL | Status: DC | PRN

## 2015-04-04 NOTE — Progress Notes (Signed)
04/04/2015 10:29 AM   DOB: 1945-11-15 / MRN: [id number]  SUBJECTIVE:  Ruth Wolfe is a 70 y.o. female presenting for non productive cough that has been present for one week. Associates nasal congestion.  She complains of chills.  Never smoker.  She does have a history of asthma and she does feel some tightness in her chest right now.  Complains of low energy.    Reports that she noticed a small lump on her neck last week over her adam's apple and is worried that this may be cancer.    She is allergic to sulfa antibiotics.   She  has a past medical history of Asthma; Environmental allergies; Hyperlipidemia; GERD (gastroesophageal reflux disease); Glaucoma; Osteopenia; and Allergy.    She  reports that she has never smoked. She has never used smokeless tobacco. She reports that she drinks about 3.0 - 3.6 oz of alcohol per week. She reports that she does not use illicit drugs. She  has no sexual activity history on file. The patient  has past surgical history that includes Total abdominal hysterectomy (1981) and Appendectomy.  Her family history includes Lung cancer in her father. There is no history of Colon cancer or Stomach cancer.  Review of Systems  Constitutional: Positive for malaise/fatigue. Negative for fever, chills and diaphoresis.  HENT: Positive for congestion and sore throat.   Respiratory: Positive for cough. Negative for hemoptysis, shortness of breath and wheezing.   Cardiovascular: Negative for chest pain.  Gastrointestinal: Negative for nausea.  Skin: Negative for rash.  Neurological: Negative for dizziness and weakness.  Endo/Heme/Allergies: Negative for polydipsia.    Problem list and medications reviewed and updated by myself where necessary, and exist elsewhere in the encounter.   OBJECTIVE:  BP 110/70 mmHg  Pulse 99  Temp(Src) 99 F (37.2 C) (Oral)  Resp 20  Ht 5' 2.6" (1.59 m)  Wt 153 lb 6.4 oz (69.582 kg)  BMI 27.52 kg/m2  SpO2 93%  Physical Exam    Constitutional: She is oriented to person, place, and time. She appears well-nourished. No distress.  Eyes: EOM are normal. Pupils are equal, round, and reactive to light.  Neck: No thyroid mass and no thyromegaly present.    Cardiovascular: Normal rate.   Pulmonary/Chest: Effort normal. No respiratory distress. She has wheezes. She has no rales. She exhibits no tenderness.  Abdominal: She exhibits no distension.  Neurological: She is alert and oriented to person, place, and time. No cranial nerve deficit. Gait normal.  Skin: Skin is dry. She is not diaphoretic.  Psychiatric: She has a normal mood and affect.  Vitals reviewed.    Chemistry   No results found for: NA, K, CL, CO2, BUN, CREATININE, GLU No results found for: CALCIUM, ALKPHOS, AST, ALT, BILITOT    Results for orders placed or performed in visit on 04/04/15 (from the past 72 hour(s))  POCT CBC     Status: Abnormal   Collection Time: 04/04/15 10:23 AM  Result Value Ref Range   WBC 5.7 4.6 - 10.2 K/uL   Lymph, poc 1.5 0.6 - 3.4   POC LYMPH PERCENT 26.2 10 - 50 %L   MID (cbc) 0.7 0 - 0.9   POC MID % 11.7 0 - 12 %M   POC Granulocyte 3.5 2 - 6.9   Granulocyte percent 62.1 37 - 80 %G   RBC 4.21 4.04 - 5.48 M/uL   Hemoglobin 13.8 12.2 - 16.2 g/dL   HCT, POC 40.2 37.7 - 47.9 %  MCV 95.6 80 - 97 fL   MCH, POC 32.8 (A) 27 - 31.2 pg   MCHC 34.3 31.8 - 35.4 g/dL   RDW, POC 12.7 %   Platelet Count, POC 215 142 - 424 K/uL   MPV 6.2 0 - 99.8 fL  POCT glucose (manual entry)     Status: Abnormal   Collection Time: 04/04/15 10:24 AM  Result Value Ref Range   POC Glucose 100 (A) 70 - 99 mg/dl    Dg Chest 2 View  04/04/2015  CLINICAL DATA:  Nonproductive cough for 1 week EXAM: CHEST  2 VIEW COMPARISON:  02/19/2010 FINDINGS: The heart size and mediastinal contours are within normal limits. Both lungs are clear. The visualized skeletal structures are unremarkable. IMPRESSION: No active cardiopulmonary disease. Electronically Signed    By: Inez Catalina M.D.   On: 04/04/2015 10:22    ASSESSMENT AND PLAN  Ruth Wolfe was seen today for uri and back pain.  Diagnoses and all orders for this visit:  Cough:  Likely a viral etiology and has caused a flare of her asthma.  She feels better with nebs.  Will treat with prednisone.  No evidence of infection.   -     POCT CBC -     DG Chest 2 View; Future -     POCT glucose (manual entry) -     predniSONE (DELTASONE) 20 MG tablet; Take 3 in the morning for 3 days, then 2 in the morning for 3 days, and then 1 in the morning for 3 days. -     HYDROcodone-homatropine (HYCODAN) 5-1.5 MG/5ML syrup; Take 2.5-5 mLs by mouth at bedtime as needed.  Wheezes -     albuterol (PROVENTIL) (2.5 MG/3ML) 0.083% nebulizer solution 2.5 mg; Take 3 mLs (2.5 mg total) by nebulization once. -     ipratropium (ATROVENT) nebulizer solution 0.5 mg; Take 2.5 mLs (0.5 mg total) by nebulization once. -     predniSONE (DELTASONE) 20 MG tablet; Take 3 in the morning for 3 days, then 2 in the morning for 3 days, and then 1 in the morning for 3 days.  Nodule of neck: Freely moveable and most likely benign.  She is concerned.  Will image to learn more.  -     US Soft Tissue Head/Neck; Future -     TSH    The patient was advised to call or return to clinic if she does not see an improvement in symptoms or to seek the care of the closest emergency department if she worsens with the above plan.   Philis Fendt, MHS, PA-C [facility] 04/04/2015 10:29 AM

## 2015-04-04 NOTE — Patient Instructions (Signed)
NOTE:  Because you received an x-ray today, you will receive an invoice from [facility]. Please contact [facility] at [phone] with questions or concerns regarding your invoice. Our billing staff will not be able to assist you with those questions.  

## 2015-04-07 ENCOUNTER — Ambulatory Visit
Admission: RE | Admit: 2015-04-07 | Discharge: 2015-04-07 | Disposition: A | Discharge status: Home / Self Care | Payer: Medicare Other | Source: Ambulatory Visit | Attending: Physician Assistant | Admitting: Physician Assistant

## 2015-04-07 DIAGNOSIS — R221 Localized swelling, mass and lump, neck: Secondary | ICD-10-CM

## 2015-04-07 DIAGNOSIS — R222 Localized swelling, mass and lump, trunk: Secondary | ICD-10-CM | POA: Diagnosis not present

## 2015-04-09 ENCOUNTER — Ambulatory Visit (INDEPENDENT_AMBULATORY_CARE_PROVIDER_SITE_OTHER): Payer: Medicare Other | Admitting: Family Medicine

## 2015-04-09 VITALS — BP 124/70 | HR 80 | Temp 98.6°F | Resp 16 | Ht 62.5 in | Wt 150.0 lb

## 2015-04-09 DIAGNOSIS — R059 Cough, unspecified: Secondary | ICD-10-CM

## 2015-04-09 DIAGNOSIS — J209 Acute bronchitis, unspecified: Secondary | ICD-10-CM

## 2015-04-09 DIAGNOSIS — R221 Localized swelling, mass and lump, neck: Secondary | ICD-10-CM | POA: Diagnosis not present

## 2015-04-09 DIAGNOSIS — R05 Cough: Secondary | ICD-10-CM | POA: Diagnosis not present

## 2015-04-09 MED ORDER — HYDROCOD POLST-CPM POLST ER 10-8 MG/5ML PO SUER: 5.0000 mL | Freq: Two times a day (BID) | ORAL | Status: DC | PRN

## 2015-04-09 MED ORDER — ALBUTEROL SULFATE 108 (90 BASE) MCG/ACT IN AEPB: 2.0000 | INHALATION_SPRAY | Freq: Four times a day (QID) | RESPIRATORY_TRACT | Status: DC | PRN

## 2015-04-09 MED ORDER — LEVOFLOXACIN 500 MG PO TABS: 500.0000 mg | ORAL_TABLET | Freq: Every day | ORAL | Status: DC

## 2015-04-09 NOTE — Patient Instructions (Addendum)
Results for orders placed or performed in visit on 04/04/15  TSH  Result Value Ref Range   TSH 2.351 0.350 - 4.500 uIU/mL  POCT CBC  Result Value Ref Range   WBC 5.7 4.6 - 10.2 K/uL   Lymph, poc 1.5 0.6 - 3.4   POC LYMPH PERCENT 26.2 10 - 50 %L   MID (cbc) 0.7 0 - 0.9   POC MID % 11.7 0 - 12 %M   POC Granulocyte 3.5 2 - 6.9   Granulocyte percent 62.1 37 - 80 %G   RBC 4.21 4.04 - 5.48 M/uL   Hemoglobin 13.8 12.2 - 16.2 g/dL   HCT, POC 40.2 37.7 - 47.9 %   MCV 95.6 80 - 97 fL   MCH, POC 32.8 (A) 27 - 31.2 pg   MCHC 34.3 31.8 - 35.4 g/dL   RDW, POC 12.7 %   Platelet Count, POC 215 142 - 424 K/uL   MPV 6.2 0 - 99.8 fL  POCT glucose (manual entry)  Result Value Ref Range   POC Glucose 100 (A) 70 - 99 mg/dl       CLINICAL DATA: 70 year old female with a history of neck mass.  EXAM: ULTRASOUND OF HEAD/NECK SOFT TISSUES  TECHNIQUE: Ultrasound examination of the head and neck soft tissues was performed in the area of clinical concern.  COMPARISON: Neck CT 01/14/2010  FINDINGS: Grayscale and color duplex imaging of the neck performed in the region of clinical concern.  There is a rounded hypoechoic nodule measuring 8 mm x 4 mm x 7 mm in the midline overlying the trachea. Questionable involvement with the thyroid isthmus and/or strap musculature. No infiltrated borders. No internal flow.  IMPRESSION: Soft tissue nodule measuring 8 mm in the midline neck overlying the trachea. Characteristics are nonspecific by ultrasound. Further evaluation with contrast-enhanced neck CT recommended.  Signed,  Dulcy Fanny. Earleen Newport, DO  [facility]  [facility]   Electronically Signed  By: Corrie Mckusick D.O.  On: 04/08/2015 11:01

## 2015-04-09 NOTE — Progress Notes (Signed)
Is a 70 year old woman has been sick for 2 weeks. She was seen last Friday and started on cough medicine and prednisone but her symptoms of gotten worse. She is unable to sleep because of the cough (nonproductive)  She states that she gets an illness like this once a year.  She also was found to have a neck mass on her exam last week. It's been nontender. She had an imaging study done yesterday and is waiting for the results.  Objective: Patient is hoarse and appears tired BP 124/70 mmHg  Pulse 80  Temp(Src) 98.6 F (37 C) (Oral)  Resp 16  Ht 5' 2.5" (1.588 m)  Wt 150 lb (68.04 kg)  BMI 26.98 kg/m2  SpO2 96% HEENT: Small erythematous 2 mm nodule on the soft palate, otherwise normal ears and throat. Neck: No palpable adenopathy or thyromegaly Chest: Pronounced expiratory rhonchi and wheezes Heart: Regular no murmur Chest x-ray was reviewed which shows flattened diaphragms and some heavy bronchial markings. Results for orders placed or performed in visit on 04/04/15  TSH  Result Value Ref Range   TSH 2.351 0.350 - 4.500 uIU/mL  POCT CBC  Result Value Ref Range   WBC 5.7 4.6 - 10.2 K/uL   Lymph, poc 1.5 0.6 - 3.4   POC LYMPH PERCENT 26.2 10 - 50 %L   MID (cbc) 0.7 0 - 0.9   POC MID % 11.7 0 - 12 %M   POC Granulocyte 3.5 2 - 6.9   Granulocyte percent 62.1 37 - 80 %G   RBC 4.21 4.04 - 5.48 M/uL   Hemoglobin 13.8 12.2 - 16.2 g/dL   HCT, POC 40.2 37.7 - 47.9 %   MCV 95.6 80 - 97 fL   MCH, POC 32.8 (A) 27 - 31.2 pg   MCHC 34.3 31.8 - 35.4 g/dL   RDW, POC 12.7 %   Platelet Count, POC 215 142 - 424 K/uL   MPV 6.2 0 - 99.8 fL  POCT glucose (manual entry)  Result Value Ref Range   POC Glucose 100 (A) 70 - 99 mg/dl       CLINICAL DATA: 70 year old female with a history of neck mass.  EXAM: ULTRASOUND OF HEAD/NECK SOFT TISSUES  TECHNIQUE: Ultrasound examination of the head and neck soft tissues was performed in the area of clinical concern.  COMPARISON: Neck CT  01/14/2010  FINDINGS: Grayscale and color duplex imaging of the neck performed in the region of clinical concern.  There is a rounded hypoechoic nodule measuring 8 mm x 4 mm x 7 mm in the midline overlying the trachea. Questionable involvement with the thyroid isthmus and/or strap musculature. No infiltrated borders. No internal flow.  IMPRESSION: Soft tissue nodule measuring 8 mm in the midline neck overlying the trachea. Characteristics are nonspecific by ultrasound. Further evaluation with contrast-enhanced neck CT recommended.  Signed,  Dulcy Fanny. Earleen Newport, DO  [facility]  [facility]   Electronically Signed  By: Corrie Mckusick D.O.  On: 04/08/2015 11:01    Assessment. There's been no improvement with the steroids and cough medicine. She definitely has a severe bronchitis. I think we need to try a different approach at this point.  Plan:     ICD-9-CM ICD-10-CM   1. Acute bronchitis, unspecified organism 466.0 J20.9 levofloxacin (LEVAQUIN) 500 MG tablet     Albuterol Sulfate (PROAIR RESPICLICK) 123XX123 (90 Base) MCG/ACT AEPB  2. Neck mass 784.2 R22.1 CT Soft Tissue Neck W Contrast     Signed,  Robyn Haber, MD

## 2015-04-09 NOTE — Addendum Note (Signed)
Addended by: Robyn Haber on: 04/09/2015 02:35 PM   Modules accepted: Orders

## 2015-04-15 ENCOUNTER — Ambulatory Visit
Admission: RE | Admit: 2015-04-15 | Discharge: 2015-04-15 | Disposition: A | Discharge status: Home / Self Care | Payer: Medicare Other | Source: Ambulatory Visit | Attending: Family Medicine | Admitting: Family Medicine

## 2015-04-15 DIAGNOSIS — R221 Localized swelling, mass and lump, neck: Secondary | ICD-10-CM

## 2015-04-15 MED ORDER — IOPAMIDOL (ISOVUE-300) INJECTION 61%
80.0000 mL | Freq: Once | INTRAVENOUS | Status: AC | PRN
  Administered 2015-04-15: 80 mL via INTRAVENOUS

## 2015-05-06 DIAGNOSIS — H401122 Primary open-angle glaucoma, left eye, moderate stage: Secondary | ICD-10-CM | POA: Diagnosis not present

## 2015-05-06 DIAGNOSIS — H16229 Keratoconjunctivitis sicca, not specified as Sjogren's, unspecified eye: Secondary | ICD-10-CM | POA: Diagnosis not present

## 2015-05-06 DIAGNOSIS — H04123 Dry eye syndrome of bilateral lacrimal glands: Secondary | ICD-10-CM | POA: Diagnosis not present

## 2015-05-06 DIAGNOSIS — H401111 Primary open-angle glaucoma, right eye, mild stage: Secondary | ICD-10-CM | POA: Diagnosis not present

## 2015-05-19 DIAGNOSIS — Z1231 Encounter for screening mammogram for malignant neoplasm of breast: Secondary | ICD-10-CM | POA: Diagnosis not present

## 2015-07-11 DIAGNOSIS — M50121 Cervical disc disorder at C4-C5 level with radiculopathy: Secondary | ICD-10-CM | POA: Diagnosis not present

## 2015-07-11 DIAGNOSIS — M50323 Other cervical disc degeneration at C6-C7 level: Secondary | ICD-10-CM | POA: Diagnosis not present

## 2015-07-11 DIAGNOSIS — M542 Cervicalgia: Secondary | ICD-10-CM | POA: Diagnosis not present

## 2015-07-11 DIAGNOSIS — M50322 Other cervical disc degeneration at C5-C6 level: Secondary | ICD-10-CM | POA: Diagnosis not present

## 2015-07-11 DIAGNOSIS — M9901 Segmental and somatic dysfunction of cervical region: Secondary | ICD-10-CM | POA: Diagnosis not present

## 2015-07-11 DIAGNOSIS — M47812 Spondylosis without myelopathy or radiculopathy, cervical region: Secondary | ICD-10-CM | POA: Diagnosis not present

## 2015-07-11 DIAGNOSIS — M5412 Radiculopathy, cervical region: Secondary | ICD-10-CM | POA: Diagnosis not present

## 2015-07-11 DIAGNOSIS — M4802 Spinal stenosis, cervical region: Secondary | ICD-10-CM | POA: Diagnosis not present

## 2015-07-11 DIAGNOSIS — M5031 Other cervical disc degeneration,  high cervical region: Secondary | ICD-10-CM | POA: Diagnosis not present

## 2015-07-11 DIAGNOSIS — M50123 Cervical disc disorder at C6-C7 level with radiculopathy: Secondary | ICD-10-CM | POA: Diagnosis not present

## 2015-07-14 DIAGNOSIS — M5412 Radiculopathy, cervical region: Secondary | ICD-10-CM | POA: Diagnosis not present

## 2015-07-14 DIAGNOSIS — M5030 Other cervical disc degeneration, unspecified cervical region: Secondary | ICD-10-CM | POA: Diagnosis not present

## 2015-07-14 DIAGNOSIS — M50121 Cervical disc disorder at C4-C5 level with radiculopathy: Secondary | ICD-10-CM | POA: Diagnosis not present

## 2015-07-14 DIAGNOSIS — M9901 Segmental and somatic dysfunction of cervical region: Secondary | ICD-10-CM | POA: Diagnosis not present

## 2015-08-20 DIAGNOSIS — M4802 Spinal stenosis, cervical region: Secondary | ICD-10-CM | POA: Diagnosis not present

## 2015-08-20 DIAGNOSIS — M4722 Other spondylosis with radiculopathy, cervical region: Secondary | ICD-10-CM | POA: Diagnosis not present

## 2015-08-20 DIAGNOSIS — R202 Paresthesia of skin: Secondary | ICD-10-CM | POA: Diagnosis not present

## 2015-08-21 ENCOUNTER — Other Ambulatory Visit: Payer: Self-pay | Admitting: Internal Medicine

## 2015-08-21 DIAGNOSIS — M542 Cervicalgia: Secondary | ICD-10-CM

## 2015-08-30 ENCOUNTER — Ambulatory Visit
Admission: RE | Admit: 2015-08-30 | Discharge: 2015-08-30 | Disposition: A | Discharge status: Home / Self Care | Payer: Medicare Other | Source: Ambulatory Visit | Attending: Internal Medicine | Admitting: Internal Medicine

## 2015-08-30 DIAGNOSIS — M542 Cervicalgia: Secondary | ICD-10-CM

## 2015-08-30 DIAGNOSIS — M50321 Other cervical disc degeneration at C4-C5 level: Secondary | ICD-10-CM | POA: Diagnosis not present

## 2015-08-30 MED ORDER — GADOBENATE DIMEGLUMINE 529 MG/ML IV SOLN
15.0000 mL | Freq: Once | INTRAVENOUS | Status: AC | PRN
  Administered 2015-08-30: 15 mL via INTRAVENOUS

## 2015-09-16 ENCOUNTER — Ambulatory Visit (INDEPENDENT_AMBULATORY_CARE_PROVIDER_SITE_OTHER): Payer: Medicare Other

## 2015-09-16 ENCOUNTER — Ambulatory Visit (INDEPENDENT_AMBULATORY_CARE_PROVIDER_SITE_OTHER): Payer: Medicare Other | Admitting: Family Medicine

## 2015-09-16 VITALS — BP 110/74 | HR 90 | Temp 98.0°F | Resp 16 | Ht 63.0 in | Wt 151.4 lb

## 2015-09-16 DIAGNOSIS — R05 Cough: Secondary | ICD-10-CM

## 2015-09-16 DIAGNOSIS — R059 Cough, unspecified: Secondary | ICD-10-CM

## 2015-09-16 DIAGNOSIS — J4521 Mild intermittent asthma with (acute) exacerbation: Secondary | ICD-10-CM

## 2015-09-16 MED ORDER — ALBUTEROL SULFATE (2.5 MG/3ML) 0.083% IN NEBU
2.5000 mg | INHALATION_SOLUTION | Freq: Once | RESPIRATORY_TRACT | Status: AC
  Administered 2015-09-16: 2.5 mg via RESPIRATORY_TRACT

## 2015-09-16 MED ORDER — PREDNISONE 20 MG PO TABS: 40.0000 mg | ORAL_TABLET | Freq: Every day | ORAL | Status: DC

## 2015-09-16 MED ORDER — AZITHROMYCIN 250 MG PO TABS: ORAL_TABLET | ORAL | Status: DC

## 2015-09-16 NOTE — Patient Instructions (Addendum)
IF you received an x-ray today, you will receive an invoice from [facility]. Please contact [facility] at [phone] with questions or concerns regarding your invoice.   IF you received labwork today, you will receive an invoice from [employer]. Please contact [facility] at [phone] with questions or concerns regarding your invoice.   Our billing staff will not be able to assist you with questions regarding bills from these companies.  You will be contacted with the lab results as soon as they are available. The fastest way to get your results is to activate your My Chart account. Instructions are located on the last page of this paperwork. If you have not heard from [place] regarding the results in 2 weeks, please contact this office.    Your symptoms are likely due to a virus initially with a secondary asthma flare. Start prednisone, 2 pills per day for the next 5 days. Watch for new side effects as we discussed. You can also use your albuterol up to every 4 hours as needed for wheezing. If the productive cough is not improving in the next day or 2, I did print an antibiotic for you to start. However if you have fevers, worsening shortness of breath, or not improving in the next 3-4 days, recommend rechecking here or other medical provider. Sooner if worse.  If you do decide to use the hydrocodone cough syrup at night, make sure you are not wheezing or short of breath of the time, and be careful combining this with other sedating medicines like the muscle relaxer.  Return to the clinic or go to the nearest emergency room if any of your symptoms worsen or new symptoms occur.  Acute Bronchitis Bronchitis is inflammation of the airways that extend from the windpipe into the lungs (bronchi). The inflammation often causes mucus to develop. This leads to a cough, which is the most common symptom of bronchitis.  In acute bronchitis, the condition  usually develops suddenly and goes away over time, usually in a couple weeks. Smoking, allergies, and asthma can make bronchitis worse. Repeated episodes of bronchitis may cause further lung problems.  CAUSES Acute bronchitis is most often caused by the same virus that causes a cold. The virus can spread from person to person (contagious) through coughing, sneezing, and touching contaminated objects. SIGNS AND SYMPTOMS   Cough.   Fever.   Coughing up mucus.   Body aches.   Chest congestion.   Chills.   Shortness of breath.   Sore throat.  DIAGNOSIS  Acute bronchitis is usually diagnosed through a physical exam. Your health care provider will also ask you questions about your medical history. Tests, such as chest X-rays, are sometimes done to rule out other conditions.  TREATMENT  Acute bronchitis usually goes away in a couple weeks. Oftentimes, no medical treatment is necessary. Medicines are sometimes given for relief of fever or cough. Antibiotic medicines are usually not needed but may be prescribed in certain situations. In some cases, an inhaler may be recommended to help reduce shortness of breath and control the cough. A cool mist vaporizer may also be used to help thin bronchial secretions and make it easier to clear the chest.  HOME CARE INSTRUCTIONS  Get plenty of rest.   Drink enough fluids to keep your urine clear or pale yellow (unless you have a medical condition that requires fluid restriction). Increasing fluids may help thin your respiratory secretions (sputum) and reduce chest congestion, and  it will prevent dehydration.   Take medicines only as directed by your health care provider.  If you were prescribed an antibiotic medicine, finish it all even if you start to feel better.  Avoid smoking and secondhand smoke. Exposure to cigarette smoke or irritating chemicals will make bronchitis worse. If you are a smoker, consider using nicotine gum or skin  patches to help control withdrawal symptoms. Quitting smoking will help your lungs heal faster.   Reduce the chances of another bout of acute bronchitis by washing your hands frequently, avoiding people with cold symptoms, and trying not to touch your hands to your mouth, nose, or eyes.   Keep all follow-up visits as directed by your health care provider.  SEEK MEDICAL CARE IF: Your symptoms do not improve after 1 week of treatment.  SEEK IMMEDIATE MEDICAL CARE IF:  You develop an increased fever or chills.   You have chest pain.   You have severe shortness of breath.  You have bloody sputum.   You develop dehydration.  You faint or repeatedly feel like you are going to pass out.  You develop repeated vomiting.  You develop a severe headache. MAKE SURE YOU:   Understand these instructions.  Will watch your condition.  Will get help right away if you are not doing well or get worse.   This information is not intended to replace advice given to you by your health care provider. Make sure you discuss any questions you have with your health care provider.   Document Released: 03/25/2004 Document Revised: 03/08/2014 Document Reviewed: 08/08/2012 Elsevier Interactive Patient Education 2016 [place]. Asthma, Acute Bronchospasm Acute bronchospasm caused by asthma is also referred to as an asthma attack. Bronchospasm means your air passages become narrowed. The narrowing is caused by inflammation and tightening of the muscles in the air tubes (bronchi) in your lungs. This can make it hard to breathe or cause you to wheeze and cough. CAUSES Possible triggers are:  Animal dander from the skin, hair, or feathers of animals.  Dust mites contained in house dust.  Cockroaches.  Pollen from trees or grass.  Mold.  Cigarette or tobacco smoke.  Air pollutants such as dust, household cleaners, hair sprays, aerosol sprays, paint fumes, strong chemicals, or strong  odors.  Cold air or weather changes. Cold air may trigger inflammation. Winds increase molds and pollens in the air.  Strong emotions such as crying or laughing hard.  Stress.  Certain medicines such as aspirin or beta-blockers.  Sulfites in foods and drinks, such as dried fruits and wine.  Infections or inflammatory conditions, such as a flu, cold, or inflammation of the nasal membranes (rhinitis).  Gastroesophageal reflux disease (GERD). GERD is a condition where stomach acid backs up into your esophagus.  Exercise or strenuous activity. SIGNS AND SYMPTOMS   Wheezing.  Excessive coughing, particularly at night.  Chest tightness.  Shortness of breath. DIAGNOSIS  Your health care provider will ask you about your medical history and perform a physical exam. A chest X-ray or blood testing may be performed to look for other causes of your symptoms or other conditions that may have triggered your asthma attack. TREATMENT  Treatment is aimed at reducing inflammation and opening up the airways in your lungs. Most asthma attacks are treated with inhaled medicines. These include quick relief or rescue medicines (such as bronchodilators) and controller medicines (such as inhaled corticosteroids). These medicines are sometimes given through an inhaler or a nebulizer. Systemic steroid medicine taken  by mouth or given through an IV tube also can be used to reduce the inflammation when an attack is moderate or severe. Antibiotic medicines are only used if a bacterial infection is present.  HOME CARE INSTRUCTIONS   Rest.  Drink plenty of liquids. This helps the mucus to remain thin and be easily coughed up. Only use caffeine in moderation and do not use alcohol until you have recovered from your illness.  Do not smoke. Avoid being exposed to secondhand smoke.  You play a critical role in keeping yourself in good health. Avoid exposure to things that cause you to wheeze or to have breathing  problems.  Keep your medicines up-to-date and available. Carefully follow your health care provider's treatment plan.  Take your medicine exactly as prescribed.  When pollen or pollution is bad, keep windows closed and use an air conditioner or go to places with air conditioning.  Asthma requires careful medical care. See your health care provider for a follow-up as advised. If you are more than [redacted] weeks pregnant and you were prescribed any new medicines, let your obstetrician know about the visit and how you are doing. Follow up with your health care provider as directed.  After you have recovered from your asthma attack, make an appointment with your outpatient doctor to talk about ways to reduce the likelihood of future attacks. If you do not have a doctor who manages your asthma, make an appointment with a primary care doctor to discuss your asthma. SEEK IMMEDIATE MEDICAL CARE IF:   You are getting worse.  You have trouble breathing. If severe, call your local emergency services (911 in the [place]  You develop chest pain or discomfort.  You are vomiting.  You are not able to keep fluids down.  You are coughing up yellow, green, brown, or bloody sputum.  You have a fever and your symptoms suddenly get worse.  You have trouble swallowing. MAKE SURE YOU:   Understand these instructions.  Will watch your condition.  Will get help right away if you are not doing well or get worse.   This information is not intended to replace advice given to you by your health care provider. Make sure you discuss any questions you have with your health care provider.   Document Released: 06/02/2006 Document Revised: 02/20/2013 Document Reviewed: 08/23/2012 Elsevier Interactive Patient Education [employer].

## 2015-09-16 NOTE — Progress Notes (Signed)
By signing my name below, I, Ruth Wolfe, attest that this documentation has been prepared under the direction and in the presence of Ruth Queen, MD.  Electronically Signed: Verlee Wolfe, Medical Scribe. 09/16/2015. 8:22 AM.  Subjective:    Patient ID: Ruth Wolfe, female    DOB: 04/25/1945, 70 y.o.   MRN: [id number]  HPI Chief Complaint  Patient presents with  . Sore Throat    x 1 week  . Cough  . Wheezing    HPI Comments: Ruth Wolfe is a 70 y.o. female with a PMHx of dysphonia: vocal cord paresis, and asthma who presents to the [facility] complaining of sore throat onset a week ago. Pt states it started out as a tickle, then she began to cough as if "dust is in her throat". Pt states her cough became productive last night with yellow colored sputum, and her wheezing was even worse this morning, has had some wheezing over the past week. Pt is on Advair regularly. Pt reports using Albuterol PRN for her symptoms- twice yesterday, and 2 puffs before bed. Pt normally doesn't have to use Albuterol. Pt denies fever, congestion, and rhinrrhea. Pt denies sick contacts.  Immunizations: Pt had about Prevnar and Pneumovax approximately 5 years ago.  Pt reports she's having neck surgery in the future (not within a week) and she's nervous about taking medications.  Patient Active Problem List   Diagnosis Date Noted  . Difficulty speaking 12/06/2011  . Paralysis of vocal cords 12/06/2011   Past Medical History  Diagnosis Date  . Asthma   . Environmental allergies   . Hyperlipidemia   . GERD (gastroesophageal reflux disease)   . Glaucoma     pre-Glaucoma  . Osteopenia   . Allergy    Past Surgical History  Procedure Laterality Date  . Total abdominal hysterectomy  1981  . Appendectomy     Allergies  Allergen Reactions  . Sulfa Antibiotics     Wheezing, swelling face and ears   Prior to Admission medications   Medication Sig Start Date End Date  Taking? Authorizing Provider  Albuterol Sulfate (PROAIR RESPICLICK) 123XX123 (90 Base) MCG/ACT AEPB Inhale 2 puffs into the lungs 4 (four) times daily as needed. 04/09/15  Yes Robyn Haber, MD  Cholecalciferol (VITAMIN D) 2000 UNITS CAPS Take by mouth daily.   Yes Historical Provider, MD  Fluticasone-Salmeterol (ADVAIR) 250-50 MCG/DOSE AEPB Inhale 1 puff into the lungs 2 (two) times daily.   Yes Historical Provider, MD  gabapentin (NEURONTIN) 300 MG capsule Take 300 mg by mouth daily.   Yes Historical Provider, MD  latanoprost (XALATAN) 0.005 % ophthalmic solution Place 1 drop into both eyes at bedtime.  10/07/11  Yes Historical Provider, MD  methocarbamol (ROBAXIN) 750 MG tablet Take 750 mg by mouth daily.   Yes Historical Provider, MD  simvastatin (ZOCOR) 40 MG tablet Take 40 mg by mouth daily.   Yes Historical Provider, MD  UNABLE TO FIND Med Name: Macular Degeneration Supplement. 1 tablet twice daily   Yes Historical Provider, MD  chlorpheniramine-HYDROcodone (TUSSIONEX PENNKINETIC ER) 10-8 MG/5ML SUER Take 5 mLs by mouth every 12 (twelve) hours as needed. Patient not taking: Reported on 09/16/2015 04/09/15   Robyn Haber, MD  HYDROcodone-homatropine [facility]) 5-1.5 MG/5ML syrup Take 2.5-5 mLs by mouth at bedtime as needed. Patient not taking: Reported on 09/16/2015 04/04/15   Tereasa Coop, PA-C   Social History   Social History  . Marital Status: Divorced  Spouse Name: N/A  . Number of Children: N/A  . Years of Education: N/A   Occupational History  . Not on file.   Social History Main Topics  . Smoking status: Never Smoker   . Smokeless tobacco: Never Used  . Alcohol Use: 3.0 - 3.6 oz/week    5-6 Cans of beer per week  . Drug Use: No  . Sexual Activity: Not on file   Other Topics Concern  . Not on file   Social History Narrative   Review of Systems  Constitutional: Negative for fever.  HENT: Positive for sore throat. Negative for congestion and rhinorrhea.   Respiratory:  Positive for cough and wheezing.    Objective:  BP 110/74 mmHg  Pulse 90  Temp(Src) 98 F (36.7 C) (Oral)  Resp 16  Ht 5\' 3"  (1.6 m)  Wt 151 lb 6.4 oz (68.675 kg)  BMI 26.83 kg/m2  SpO2 94%  Physical Exam  Constitutional: She is oriented to person, place, and time. She appears well-developed and well-nourished. No distress.  HENT:  Head: Normocephalic and atraumatic.  Right Ear: Hearing, tympanic membrane, external ear and ear canal normal.  Left Ear: Hearing, tympanic membrane, external ear and ear canal normal.  Nose: Nose normal.  Mouth/Throat: Oropharynx is clear and moist. No oropharyngeal exudate.  No sinus tenderness  Eyes: Conjunctivae and EOM are normal. Pupils are equal, round, and reactive to light.  Cardiovascular: Normal rate, regular rhythm, normal heart sounds and intact distal pulses.   No murmur heard. Pulmonary/Chest: Effort normal. No respiratory distress. She has wheezes. She has no rhonchi.  expiratory wheeze throughout, otherwise clear.  Lymphadenopathy:  No lymph adenopathy in the neck  Neurological: She is alert and oriented to person, place, and time.  Skin: Skin is warm and dry. No rash noted.  Psychiatric: She has a normal mood and affect. Her behavior is normal.  Vitals reviewed.  Dg Chest 2 View  09/16/2015  CLINICAL DATA:  Sore throat, productive cough, increasing wheezing over the past week; history of asthma, environmental allergies, vocal cord paralysis EXAM: CHEST  2 VIEW COMPARISON:  Chest x-ray of April 04, 2015 FINDINGS: The lungs are hyperinflated with hemidiaphragm flattening. The heart and pulmonary vascularity are normal. The mediastinum is normal in width. There is no pleural effusion. There is multilevel degenerative disc disease of the thoracic spine. IMPRESSION: Reactive airway disease-emphysema. Slight overall increase in interstitial markings may reflect acute exacerbation of asthma or acute bronchitic change. There is no alveolar  pneumonia nor CHF. Electronically Signed   By: David  Martinique M.D.   On: 09/16/2015 08:47   [8:57 AM] Improved aeration, still few scattered coarse sounds: RLL/RML > LLL. Pt denies experiencing palpitations, and hallucinations while on Prednisone.  Assessment & Plan:   Ruth Wolfe is a 70 y.o. female Asthmatic bronchitis, mild intermittent, with acute exacerbation - Plan: albuterol (PROVENTIL) (2.5 MG/3ML) 0.083% nebulizer solution 2.5 mg, DG Chest 2 View, predniSONE (DELTASONE) 20 MG tablet, azithromycin (ZITHROMAX) 250 MG tablet  Cough - Plan: albuterol (PROVENTIL) (2.5 MG/3ML) 0.083% nebulizer solution 2.5 mg, DG Chest 2 View  Suspected viral illness with secondary asthmatic flare. Afebrile, improved aeration after epidural med.  -Start prednisone 40 mg daily 5 days. Side effects discussed  -continue Advair, albuterol up to every 4 hours as needed.  -If not improving the next 2-3 days, can start Z-Pak.  -Caution discussde with hydrocodone cough syrup and other sedating medications.  -RTC precautions.  Meds ordered this encounter  Medications  . gabapentin (NEURONTIN) 300 MG capsule    Sig: Take 300 mg by mouth daily.  . methocarbamol (ROBAXIN) 750 MG tablet    Sig: Take 750 mg by mouth daily.  Marland Kitchen albuterol (PROVENTIL) (2.5 MG/3ML) 0.083% nebulizer solution 2.5 mg    Sig:   . predniSONE (DELTASONE) 20 MG tablet    Sig: Take 2 tablets (40 mg total) by mouth daily with breakfast.    Dispense:  10 tablet    Refill:  0  . azithromycin (ZITHROMAX) 250 MG tablet    Sig: Take 2 pills by mouth on day 1, then 1 pill by mouth per day on days 2 through 5.    Dispense:  6 tablet    Refill:  0   Patient Instructions       IF you received an x-ray today, you will receive an invoice from [facility]. Please contact [facility] at [phone] with questions or concerns regarding your invoice.   IF you received labwork today, you will receive an invoice from [employer] Please contact [facility] at [phone] with questions or concerns regarding your invoice.   Our billing staff will not be able to assist you with questions regarding bills from these companies.  You will be contacted with the lab results as soon as they are available. The fastest way to get your results is to activate your My Chart account. Instructions are located on the last page of this paperwork. If you have not heard from [place] regarding the results in 2 weeks, please contact this office.    Your symptoms are likely due to a virus initially with a secondary asthma flare. Start prednisone, 2 pills per day for the next 5 days. Watch for new side effects as we discussed. You can also use your albuterol up to every 4 hours as needed for wheezing. If the productive cough is not improving in the next day or 2, I did print an antibiotic for you to start. However if you have fevers, worsening shortness of breath, or not improving in the next 3-4 days, recommend rechecking here or other medical provider. Sooner if worse.  If you do decide to use the hydrocodone cough syrup at night, make sure you are not wheezing or short of breath of the time, and be careful combining this with other sedating medicines like the muscle relaxer.  Return to the clinic or go to the nearest emergency room if any of your symptoms worsen or new symptoms occur.  Acute Bronchitis Bronchitis is inflammation of the airways that extend from the windpipe into the lungs (bronchi). The inflammation often causes mucus to develop. This leads to a cough, which is the most common symptom of bronchitis.  In acute bronchitis, the condition usually develops suddenly and goes away over time, usually in a couple weeks. Smoking, allergies, and asthma can make bronchitis worse. Repeated episodes of bronchitis may cause further lung problems.  CAUSES Acute bronchitis is most often caused by the same virus that causes  a cold. The virus can spread from person to person (contagious) through coughing, sneezing, and touching contaminated objects. SIGNS AND SYMPTOMS   Cough.   Fever.   Coughing up mucus.   Body aches.   Chest congestion.   Chills.   Shortness of breath.   Sore throat.  DIAGNOSIS  Acute bronchitis is usually diagnosed through a physical exam. Your health care provider will also ask you questions about your medical history. Tests,  such as chest X-rays, are sometimes done to rule out other conditions.  TREATMENT  Acute bronchitis usually goes away in a couple weeks. Oftentimes, no medical treatment is necessary. Medicines are sometimes given for relief of fever or cough. Antibiotic medicines are usually not needed but may be prescribed in certain situations. In some cases, an inhaler may be recommended to help reduce shortness of breath and control the cough. A cool mist vaporizer may also be used to help thin bronchial secretions and make it easier to clear the chest.  HOME CARE INSTRUCTIONS  Get plenty of rest.   Drink enough fluids to keep your urine clear or pale yellow (unless you have a medical condition that requires fluid restriction). Increasing fluids may help thin your respiratory secretions (sputum) and reduce chest congestion, and it will prevent dehydration.   Take medicines only as directed by your health care provider.  If you were prescribed an antibiotic medicine, finish it all even if you start to feel better.  Avoid smoking and secondhand smoke. Exposure to cigarette smoke or irritating chemicals will make bronchitis worse. If you are a smoker, consider using nicotine gum or skin patches to help control withdrawal symptoms. Quitting smoking will help your lungs heal faster.   Reduce the chances of another bout of acute bronchitis by washing your hands frequently, avoiding people with cold symptoms, and trying not to touch your hands to your mouth, nose, or  eyes.   Keep all follow-up visits as directed by your health care provider.  SEEK MEDICAL CARE IF: Your symptoms do not improve after 1 week of treatment.  SEEK IMMEDIATE MEDICAL CARE IF:  You develop an increased fever or chills.   You have chest pain.   You have severe shortness of breath.  You have bloody sputum.   You develop dehydration.  You faint or repeatedly feel like you are going to pass out.  You develop repeated vomiting.  You develop a severe headache. MAKE SURE YOU:   Understand these instructions.  Will watch your condition.  Will get help right away if you are not doing well or get worse.   This information is not intended to replace advice given to you by your health care provider. Make sure you discuss any questions you have with your health care provider.   Document Released: 03/25/2004 Document Revised: 03/08/2014 Document Reviewed: 08/08/2012 Elsevier Interactive Patient Education 2016 [place]. Asthma, Acute Bronchospasm Acute bronchospasm caused by asthma is also referred to as an asthma attack. Bronchospasm means your air passages become narrowed. The narrowing is caused by inflammation and tightening of the muscles in the air tubes (bronchi) in your lungs. This can make it hard to breathe or cause you to wheeze and cough. CAUSES Possible triggers are:  Animal dander from the skin, hair, or feathers of animals.  Dust mites contained in house dust.  Cockroaches.  Pollen from trees or grass.  Mold.  Cigarette or tobacco smoke.  Air pollutants such as dust, household cleaners, hair sprays, aerosol sprays, paint fumes, strong chemicals, or strong odors.  Cold air or weather changes. Cold air may trigger inflammation. Winds increase molds and pollens in the air.  Strong emotions such as crying or laughing hard.  Stress.  Certain medicines such as aspirin or beta-blockers.  Sulfites in foods and drinks, such as dried fruits and  wine.  Infections or inflammatory conditions, such as a flu, cold, or inflammation of the nasal membranes (rhinitis).  Gastroesophageal reflux disease (  GERD). GERD is a condition where stomach acid backs up into your esophagus.  Exercise or strenuous activity. SIGNS AND SYMPTOMS   Wheezing.  Excessive coughing, particularly at night.  Chest tightness.  Shortness of breath. DIAGNOSIS  Your health care provider will ask you about your medical history and perform a physical exam. A chest X-ray or blood testing may be performed to look for other causes of your symptoms or other conditions that may have triggered your asthma attack. TREATMENT  Treatment is aimed at reducing inflammation and opening up the airways in your lungs. Most asthma attacks are treated with inhaled medicines. These include quick relief or rescue medicines (such as bronchodilators) and controller medicines (such as inhaled corticosteroids). These medicines are sometimes given through an inhaler or a nebulizer. Systemic steroid medicine taken by mouth or given through an IV tube also can be used to reduce the inflammation when an attack is moderate or severe. Antibiotic medicines are only used if a bacterial infection is present.  HOME CARE INSTRUCTIONS   Rest.  Drink plenty of liquids. This helps the mucus to remain thin and be easily coughed up. Only use caffeine in moderation and do not use alcohol until you have recovered from your illness.  Do not smoke. Avoid being exposed to secondhand smoke.  You play a critical role in keeping yourself in good health. Avoid exposure to things that cause you to wheeze or to have breathing problems.  Keep your medicines up-to-date and available. Carefully follow your health care provider's treatment plan.  Take your medicine exactly as prescribed.  When pollen or pollution is bad, keep windows closed and use an air conditioner or go to places with air  conditioning.  Asthma requires careful medical care. See your health care provider for a follow-up as advised. If you are more than [redacted] weeks pregnant and you were prescribed any new medicines, let your obstetrician know about the visit and how you are doing. Follow up with your health care provider as directed.  After you have recovered from your asthma attack, make an appointment with your outpatient doctor to talk about ways to reduce the likelihood of future attacks. If you do not have a doctor who manages your asthma, make an appointment with a primary care doctor to discuss your asthma. SEEK IMMEDIATE MEDICAL CARE IF:   You are getting worse.  You have trouble breathing. If severe, call your local emergency services (911 in the [place]  You develop chest pain or discomfort.  You are vomiting.  You are not able to keep fluids down.  You are coughing up yellow, green, brown, or bloody sputum.  You have a fever and your symptoms suddenly get worse.  You have trouble swallowing. MAKE SURE YOU:   Understand these instructions.  Will watch your condition.  Will get help right away if you are not doing well or get worse.   This information is not intended to replace advice given to you by your health care provider. Make sure you discuss any questions you have with your health care provider.   Document Released: 06/02/2006 Document Revised: 02/20/2013 Document Reviewed: 08/23/2012 Elsevier Interactive Patient Education [employer].      I personally performed the services described in this documentation, which was scribed in my presence. The recorded information has been reviewed and considered, and addended by me as needed.   Signed,   Merri Ray, MD [facility].  09/16/2015  9:16 AM

## 2015-09-22 DIAGNOSIS — H04123 Dry eye syndrome of bilateral lacrimal glands: Secondary | ICD-10-CM | POA: Diagnosis not present

## 2015-09-22 DIAGNOSIS — H16229 Keratoconjunctivitis sicca, not specified as Sjogren's, unspecified eye: Secondary | ICD-10-CM | POA: Diagnosis not present

## 2015-09-22 DIAGNOSIS — H401122 Primary open-angle glaucoma, left eye, moderate stage: Secondary | ICD-10-CM | POA: Diagnosis not present

## 2015-09-22 DIAGNOSIS — H401111 Primary open-angle glaucoma, right eye, mild stage: Secondary | ICD-10-CM | POA: Diagnosis not present

## 2015-09-23 DIAGNOSIS — E7800 Pure hypercholesterolemia, unspecified: Secondary | ICD-10-CM | POA: Diagnosis not present

## 2015-10-01 DIAGNOSIS — F17211 Nicotine dependence, cigarettes, in remission: Secondary | ICD-10-CM | POA: Diagnosis not present

## 2015-10-01 DIAGNOSIS — E785 Hyperlipidemia, unspecified: Secondary | ICD-10-CM | POA: Diagnosis not present

## 2015-10-01 DIAGNOSIS — Z0001 Encounter for general adult medical examination with abnormal findings: Secondary | ICD-10-CM | POA: Diagnosis not present

## 2015-10-01 DIAGNOSIS — E663 Overweight: Secondary | ICD-10-CM | POA: Diagnosis not present

## 2015-10-30 DIAGNOSIS — Z6827 Body mass index (BMI) 27.0-27.9, adult: Secondary | ICD-10-CM | POA: Diagnosis not present

## 2015-10-30 DIAGNOSIS — M5412 Radiculopathy, cervical region: Secondary | ICD-10-CM | POA: Diagnosis not present

## 2015-10-30 DIAGNOSIS — R030 Elevated blood-pressure reading, without diagnosis of hypertension: Secondary | ICD-10-CM | POA: Diagnosis not present

## 2015-11-30 ENCOUNTER — Encounter (HOSPITAL_COMMUNITY): Payer: Self-pay | Admitting: Emergency Medicine

## 2015-11-30 ENCOUNTER — Emergency Department (HOSPITAL_COMMUNITY): Payer: Medicare Other

## 2015-11-30 ENCOUNTER — Emergency Department (HOSPITAL_COMMUNITY)
Admission: EM | Admit: 2015-11-30 | Discharge: 2015-11-30 | Disposition: A | Discharge status: Home / Self Care | Payer: Medicare Other | Attending: Emergency Medicine | Admitting: Emergency Medicine

## 2015-11-30 DIAGNOSIS — S52532A Colles' fracture of left radius, initial encounter for closed fracture: Secondary | ICD-10-CM | POA: Insufficient documentation

## 2015-11-30 DIAGNOSIS — Y939 Activity, unspecified: Secondary | ICD-10-CM | POA: Diagnosis not present

## 2015-11-30 DIAGNOSIS — Y999 Unspecified external cause status: Secondary | ICD-10-CM | POA: Insufficient documentation

## 2015-11-30 DIAGNOSIS — W0110XA Fall on same level from slipping, tripping and stumbling with subsequent striking against unspecified object, initial encounter: Secondary | ICD-10-CM | POA: Insufficient documentation

## 2015-11-30 DIAGNOSIS — S6992XA Unspecified injury of left wrist, hand and finger(s), initial encounter: Secondary | ICD-10-CM | POA: Diagnosis not present

## 2015-11-30 DIAGNOSIS — S52571A Other intraarticular fracture of lower end of right radius, initial encounter for closed fracture: Secondary | ICD-10-CM | POA: Diagnosis not present

## 2015-11-30 DIAGNOSIS — S52531A Colles' fracture of right radius, initial encounter for closed fracture: Secondary | ICD-10-CM | POA: Diagnosis not present

## 2015-11-30 DIAGNOSIS — S52611A Displaced fracture of right ulna styloid process, initial encounter for closed fracture: Secondary | ICD-10-CM | POA: Diagnosis not present

## 2015-11-30 DIAGNOSIS — S6991XA Unspecified injury of right wrist, hand and finger(s), initial encounter: Secondary | ICD-10-CM | POA: Diagnosis present

## 2015-11-30 DIAGNOSIS — Y92009 Unspecified place in unspecified non-institutional (private) residence as the place of occurrence of the external cause: Secondary | ICD-10-CM | POA: Insufficient documentation

## 2015-11-30 DIAGNOSIS — J45909 Unspecified asthma, uncomplicated: Secondary | ICD-10-CM | POA: Insufficient documentation

## 2015-11-30 HISTORY — DX: Polyneuropathy, unspecified: G62.9

## 2015-11-30 MED ORDER — HYDROCODONE-ACETAMINOPHEN 5-325 MG PO TABS: 1.0000 | ORAL_TABLET | ORAL | 0 refills | Status: DC | PRN

## 2015-11-30 MED ORDER — ACETAMINOPHEN 325 MG PO TABS
650.0000 mg | ORAL_TABLET | Freq: Once | ORAL | Status: AC
  Administered 2015-11-30: 650 mg via ORAL
  Filled 2015-11-30: qty 2

## 2015-11-30 NOTE — ED Triage Notes (Signed)
Pt fell at 745 this morning and used both arms to catch self and now c/o of swelling, pain and deformity to both wrists / forearms. Pt c/o sacral pain. Hit head on dresser but no LOC.

## 2015-11-30 NOTE — ED Provider Notes (Addendum)
[facility] DEPT Provider Note   CSN: [id number] Arrival date & time: 11/30/15  0911     History   Chief Complaint Chief Complaint  Patient presents with  . Fall  . Joint Swelling  . Tailbone Pain    HPI Ruth Wolfe is a 70 y.o. female.  The history is provided by the patient.   Patient presents after a mechanical fall at home.  She was putting on her pants when she slipped and fell backwards landing on her bottom as well as both of her wrists.  She put both of her hands behind her to catch herself and ended up with [place] injuries bilaterally.  She presents with bilateral wrist pain.  She reports mild pain in her tailbone but reports no thoracic or lumbar pain.  She reports no weakness in arms or legs.  She did not injure her head.  No headache at this time.  Denies neck pain.  Pain in her bilateral wrists is moderate with right wrist bother her more than the left.   Past Medical History:  Diagnosis Date  . Allergy   . Asthma   . Environmental allergies   . GERD (gastroesophageal reflux disease)   . Glaucoma    pre-Glaucoma  . Hyperlipidemia   . Neuropathy ([place])   . Osteopenia     Patient Active Problem List   Diagnosis Date Noted  . Difficulty speaking 12/06/2011  . 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 APPENDECTOMY    . TOTAL ABDOMINAL HYSTERECTOMY  1981    OB History    No data available       Home Medications    Prior to Admission medications   Medication Sig Start Date End Date Taking? Authorizing Provider  Albuterol Sulfate (PROAIR RESPICLICK) 123XX123 (90 Base) MCG/ACT AEPB Inhale 2 puffs into the lungs 4 (four) times daily as needed. Patient taking differently: Inhale 2 puffs into the lungs 4 (four) times daily as needed. Wheezing 04/09/15  Yes Robyn Haber, MD  ALPRAZolam Duanne Moron) 0.5 MG tablet Take 0.5 mg by mouth at bedtime as needed for sleep.  10/01/15  Yes Historical Provider, MD    Cholecalciferol (VITAMIN D) 2000 UNITS CAPS Take 2,000 Units by mouth daily.    Yes Historical Provider, MD  cycloSPORINE (RESTASIS) 0.05 % ophthalmic emulsion Place 1 drop into both eyes 2 (two) times daily.   Yes Historical Provider, MD  escitalopram (LEXAPRO) 10 MG tablet Take 10 mg by mouth daily.   Yes Historical Provider, MD  Fluticasone-Salmeterol (ADVAIR) 250-50 MCG/DOSE AEPB Inhale 1 puff into the lungs at bedtime.    Yes Historical Provider, MD  gabapentin (NEURONTIN) 300 MG capsule Take 300 mg by mouth at bedtime.    Yes Historical Provider, MD  latanoprost (XALATAN) 0.005 % ophthalmic solution Place 1 drop into both eyes at bedtime.  10/07/11  Yes Historical Provider, MD  methocarbamol (ROBAXIN) 750 MG tablet Take 750 mg by mouth at bedtime.    Yes Historical Provider, MD  simvastatin (ZOCOR) 40 MG tablet Take 40 mg by mouth daily.   Yes Historical Provider, MD  UNABLE TO FIND Take 1 tablet by mouth 2 (two) times daily. Med Name: Macular Degeneration Supplement. 1 tablet twice daily    Yes Historical Provider, MD  azithromycin (ZITHROMAX) 250 MG tablet Take 2 pills by mouth on day 1, then 1 pill by mouth per day on days 2 through 5. Patient not taking:  Reported on 11/30/2015 09/16/15   Wendie Agreste, MD  HYDROcodone-homatropine [facility]) 5-1.5 MG/5ML syrup Take 2.5-5 mLs by mouth at bedtime as needed. Patient not taking: Reported on 11/30/2015 04/04/15   Tereasa Coop, PA-C  predniSONE (DELTASONE) 20 MG tablet Take 2 tablets (40 mg total) by mouth daily with breakfast. Patient not taking: Reported on 11/30/2015 09/16/15   Wendie Agreste, MD    Family History Family History  Problem Relation Age of Onset  . Lung cancer Father   . Colon cancer Neg Hx   . Stomach cancer Neg Hx     Social History Social History  Substance Use Topics  . Smoking status: Never Smoker  . Smokeless tobacco: Never Used  . Alcohol use 3.0 - 3.6 oz/week    5 - 6 Cans of beer per week     Allergies    Sulfa antibiotics   Review of Systems Review of Systems  All other systems reviewed and are negative.    Physical Exam Updated Vital Signs BP 120/67 (BP Location: Left Arm)   Pulse 77   Temp 98.3 F (36.8 C) (Oral)   Resp 18   SpO2 98%   Physical Exam  Constitutional: She is oriented to person, place, and time. She appears well-developed and well-nourished.  HENT:  Head: Normocephalic.  Eyes: EOM are normal.  Neck: Normal range of motion.  Pulmonary/Chest: Effort normal.  Abdominal: She exhibits no distension.  Musculoskeletal:  Painful ROM of bilateral wrists without obvious deformity  Neurological: She is alert and oriented to person, place, and time.  Psychiatric: She has a normal mood and affect.  Nursing note and vitals reviewed.    ED Treatments / Results  Labs (all labs ordered are listed, but only abnormal results are displayed) Labs Reviewed - No data to display  EKG  EKG Interpretation None       Radiology Dg Wrist Complete Left  Result Date: 11/30/2015 CLINICAL DATA:  Post trip and fall while getting dressed this morning, now with bilateral wrist deformities. EXAM: LEFT WRIST - COMPLETE 3+ VIEW COMPARISON:  None. FINDINGS: There is a vertically oriented lucency involving the distal radial epiphysis with potential extension to involve the distal radial carpal joint worrisome for a nondisplaced fracture. This finding is associated with displacement of the pronator quadratus fat pad and mild diffuse soft tissue swelling about the wrist. No additional fractures identified. Joint spaces are preserved. No evidence of chondrocalcinosis. No dislocation. No radiopaque foreign body. IMPRESSION: Vertically oriented lucency involving the distal radius worrisome for a nondisplaced fracture. Correlation for point tenderness at this location is recommended. Electronically Signed   By: Sandi Mariscal M.D.   On: 11/30/2015 10:41   Dg Wrist Complete Right  Result Date:  11/30/2015 CLINICAL DATA:  Post trip while getting dressed this morning now with bilateral wrist deformities, right greater than left. EXAM: RIGHT WRIST - COMPLETE 3+ VIEW COMPARISON:  None. FINDINGS: There is a comminuted though minimally displaced fracture involving the distal radius with extension to involve the distal radial carpal joint as well as likely the distal radial ulnar joint. Note is also made of a minimally displaced ulnar styloid process fracture. No dislocation. There is expected displacement of the pronator quadratus fat pad in soft tissue swelling about the wrist. No radiopaque foreign body. IMPRESSION: 1. Comminuted, minimally displaced fracture involving the distal radius with intra-articular extension. 2. Minimally displaced ulnar styloid process fracture. Electronically Signed   By: Eldridge Abrahams.D.  On: 11/30/2015 10:39    Procedures Procedures (including critical care time)  ++++++++++++++++++++++++++++++++++++++++++++++++++  SPLINT APPLICATION #1 Authorized by: Hoy Morn Consent: Verbal consent obtained. Risks and benefits: risks, benefits and alternatives were discussed Consent given by: patient Splint applied by: orthopedic technician Location details: right upper extremity Splint type: volar plate Supplies used: orthoglass Post-procedure: The splinted body part was neurovascularly unchanged following the procedure. Patient tolerance: Patient tolerated the procedure well with no immediate complications.   SPLINT APPLICATION #2 Authorized by: Hoy Morn Consent: Verbal consent obtained. Risks and benefits: risks, benefits and alternatives were discussed Consent given by: patient Splint applied by: orthopedic technician Location details: left upper extremity Splint type: volar plate Supplies used: orthoglass Post-procedure: The splinted body part was neurovascularly unchanged following the procedure. Patient tolerance: Patient tolerated the procedure  well with no immediate complications.     ++++++++++++++++++++++++++++++++++++++++++++++++++++++++++   Medications Ordered in ED Medications  acetaminophen (TYLENOL) tablet 650 mg (650 mg Oral Given 11/30/15 1006)     Initial Impression / Assessment and Plan / ED Course  I have reviewed the triage vital signs and the nursing notes.  Pertinent labs & imaging results that were available during my care of the patient were reviewed by me and considered in my medical decision making (see chart for details).  Clinical Course     Patient overall well-appearing.  C-spine nontender.  Chest and abdomen benign.  Isolated injuries to her bilateral wrists.  She reports she will be able to care for herself at home despite limited use of both wrists.  She has people she can contact per the patient.  She was placed in bilateral splints.  She'll need follow-up with orthopedic hand surgery.  Contact information given.   Final Clinical Impressions(s) / ED Diagnoses   Final diagnoses:  None    New Prescriptions New Prescriptions   No medications on file     Jola Schmidt, MD 11/30/15 [place], MD 01/02/16 [phone]

## 2015-12-02 DIAGNOSIS — S52572A Other intraarticular fracture of lower end of left radius, initial encounter for closed fracture: Secondary | ICD-10-CM | POA: Diagnosis not present

## 2015-12-02 DIAGNOSIS — S52601A Unspecified fracture of lower end of right ulna, initial encounter for closed fracture: Secondary | ICD-10-CM | POA: Diagnosis not present

## 2015-12-02 DIAGNOSIS — S52501A Unspecified fracture of the lower end of right radius, initial encounter for closed fracture: Secondary | ICD-10-CM | POA: Diagnosis not present

## 2015-12-02 DIAGNOSIS — M25532 Pain in left wrist: Secondary | ICD-10-CM | POA: Diagnosis not present

## 2015-12-10 DIAGNOSIS — H353130 Nonexudative age-related macular degeneration, bilateral, stage unspecified: Secondary | ICD-10-CM | POA: Diagnosis not present

## 2015-12-10 DIAGNOSIS — M25532 Pain in left wrist: Secondary | ICD-10-CM | POA: Diagnosis not present

## 2015-12-10 DIAGNOSIS — M25531 Pain in right wrist: Secondary | ICD-10-CM | POA: Diagnosis not present

## 2015-12-10 DIAGNOSIS — S52572D Other intraarticular fracture of lower end of left radius, subsequent encounter for closed fracture with routine healing: Secondary | ICD-10-CM | POA: Diagnosis not present

## 2015-12-10 DIAGNOSIS — H401131 Primary open-angle glaucoma, bilateral, mild stage: Secondary | ICD-10-CM | POA: Diagnosis not present

## 2015-12-10 DIAGNOSIS — S52501D Unspecified fracture of the lower end of right radius, subsequent encounter for closed fracture with routine healing: Secondary | ICD-10-CM | POA: Diagnosis not present

## 2015-12-17 DIAGNOSIS — S52572D Other intraarticular fracture of lower end of left radius, subsequent encounter for closed fracture with routine healing: Secondary | ICD-10-CM | POA: Diagnosis not present

## 2015-12-17 DIAGNOSIS — S52501D Unspecified fracture of the lower end of right radius, subsequent encounter for closed fracture with routine healing: Secondary | ICD-10-CM | POA: Diagnosis not present

## 2015-12-24 DIAGNOSIS — S52501D Unspecified fracture of the lower end of right radius, subsequent encounter for closed fracture with routine healing: Secondary | ICD-10-CM | POA: Diagnosis not present

## 2015-12-24 DIAGNOSIS — S52572D Other intraarticular fracture of lower end of left radius, subsequent encounter for closed fracture with routine healing: Secondary | ICD-10-CM | POA: Diagnosis not present

## 2016-01-07 DIAGNOSIS — S52501D Unspecified fracture of the lower end of right radius, subsequent encounter for closed fracture with routine healing: Secondary | ICD-10-CM | POA: Diagnosis not present

## 2016-01-07 DIAGNOSIS — M25531 Pain in right wrist: Secondary | ICD-10-CM | POA: Diagnosis not present

## 2016-01-07 DIAGNOSIS — S52572D Other intraarticular fracture of lower end of left radius, subsequent encounter for closed fracture with routine healing: Secondary | ICD-10-CM | POA: Diagnosis not present

## 2016-01-07 DIAGNOSIS — M25532 Pain in left wrist: Secondary | ICD-10-CM | POA: Diagnosis not present

## 2016-01-28 DIAGNOSIS — M25531 Pain in right wrist: Secondary | ICD-10-CM | POA: Diagnosis not present

## 2016-01-28 DIAGNOSIS — S52501D Unspecified fracture of the lower end of right radius, subsequent encounter for closed fracture with routine healing: Secondary | ICD-10-CM | POA: Diagnosis not present

## 2016-01-28 DIAGNOSIS — S52572D Other intraarticular fracture of lower end of left radius, subsequent encounter for closed fracture with routine healing: Secondary | ICD-10-CM | POA: Diagnosis not present

## 2016-01-28 DIAGNOSIS — M25532 Pain in left wrist: Secondary | ICD-10-CM | POA: Diagnosis not present

## 2016-02-16 DIAGNOSIS — S52572D Other intraarticular fracture of lower end of left radius, subsequent encounter for closed fracture with routine healing: Secondary | ICD-10-CM | POA: Diagnosis not present

## 2016-02-16 DIAGNOSIS — M25531 Pain in right wrist: Secondary | ICD-10-CM | POA: Diagnosis not present

## 2016-02-16 DIAGNOSIS — S52501D Unspecified fracture of the lower end of right radius, subsequent encounter for closed fracture with routine healing: Secondary | ICD-10-CM | POA: Diagnosis not present

## 2016-02-16 DIAGNOSIS — S52601D Unspecified fracture of lower end of right ulna, subsequent encounter for closed fracture with routine healing: Secondary | ICD-10-CM | POA: Diagnosis not present

## 2016-02-16 DIAGNOSIS — M25532 Pain in left wrist: Secondary | ICD-10-CM | POA: Diagnosis not present

## 2016-02-24 ENCOUNTER — Ambulatory Visit (INDEPENDENT_AMBULATORY_CARE_PROVIDER_SITE_OTHER): Payer: Medicare Other | Admitting: Emergency Medicine

## 2016-02-24 ENCOUNTER — Encounter: Payer: Self-pay | Admitting: Emergency Medicine

## 2016-02-24 DIAGNOSIS — J209 Acute bronchitis, unspecified: Secondary | ICD-10-CM

## 2016-02-24 DIAGNOSIS — S52572D Other intraarticular fracture of lower end of left radius, subsequent encounter for closed fracture with routine healing: Secondary | ICD-10-CM | POA: Diagnosis not present

## 2016-02-24 DIAGNOSIS — R05 Cough: Secondary | ICD-10-CM | POA: Diagnosis not present

## 2016-02-24 DIAGNOSIS — R059 Cough, unspecified: Secondary | ICD-10-CM

## 2016-02-24 MED ORDER — AZITHROMYCIN 250 MG PO TABS: ORAL_TABLET | ORAL | 0 refills | Status: DC

## 2016-02-24 MED ORDER — IPRATROPIUM BROMIDE 0.02 % IN SOLN
0.5000 mg | Freq: Once | RESPIRATORY_TRACT | Status: AC
  Administered 2016-02-24: 0.5 mg via RESPIRATORY_TRACT

## 2016-02-24 MED ORDER — PREDNISONE 20 MG PO TABS: 40.0000 mg | ORAL_TABLET | Freq: Every day | ORAL | 0 refills | Status: AC

## 2016-02-24 MED ORDER — ALBUTEROL SULFATE (2.5 MG/3ML) 0.083% IN NEBU
2.5000 mg | INHALATION_SOLUTION | Freq: Once | RESPIRATORY_TRACT | Status: AC
Start: 2016-02-24 — End: 2016-02-24
  Administered 2016-02-24: 2.5 mg via RESPIRATORY_TRACT

## 2016-02-24 NOTE — Progress Notes (Signed)
Ruth Wolfe 70 y.o.   Chief Complaint  Patient presents with  . Nasal Congestion    symptoms began thursday   . chest congestion  . Cough    HISTORY OF PRESENT ILLNESS: This is a 70 y.o. female complaining of cough and congestion for several days.   Cough  This is a new problem. The current episode started in the past 7 days. The problem has been gradually worsening. The problem occurs every few minutes. The cough is non-productive. Associated symptoms include chills, nasal congestion, shortness of breath and wheezing. Pertinent negatives include no chest pain, ear congestion, ear pain, fever, headaches, hemoptysis, myalgias, rash, rhinorrhea or sore throat. The symptoms are aggravated by lying down. She has tried a beta-agonist inhaler and steroid inhaler for the symptoms. The treatment provided mild relief. Her past medical history is significant for asthma.  No red flag signs or symptoms.   Prior to Admission medications   Medication Sig Start Date End Date Taking? Authorizing Provider  Albuterol Sulfate (PROAIR RESPICLICK) 123XX123 (90 Base) MCG/ACT AEPB Inhale 2 puffs into the lungs 4 (four) times daily as needed. Patient taking differently: Inhale 2 puffs into the lungs 4 (four) times daily as needed. Wheezing 04/09/15  Yes Robyn Haber, MD  ALPRAZolam Duanne Moron) 0.5 MG tablet Take 0.5 mg by mouth at bedtime as needed for sleep.  10/01/15  Yes Historical Provider, MD  Cholecalciferol (VITAMIN D) 2000 UNITS CAPS Take 2,000 Units by mouth daily.    Yes Historical Provider, MD  escitalopram (LEXAPRO) 10 MG tablet Take 10 mg by mouth daily.   Yes Historical Provider, MD  fluticasone (FLONASE) 50 MCG/ACT nasal spray Place into both nostrils daily.   Yes Historical Provider, MD  Fluticasone-Salmeterol (ADVAIR) 250-50 MCG/DOSE AEPB Inhale 1 puff into the lungs at bedtime.    Yes Historical Provider, MD  gabapentin (NEURONTIN) 300 MG capsule Take 300 mg by mouth at bedtime.    Yes Historical  Provider, MD  latanoprost (XALATAN) 0.005 % ophthalmic solution Place 1 drop into both eyes at bedtime.  10/07/11  Yes Historical Provider, MD  methocarbamol (ROBAXIN) 750 MG tablet Take 750 mg by mouth at bedtime.    Yes Historical Provider, MD  pseudoephedrine-guaifenesin (MUCINEX D) 60-600 MG 12 hr tablet Take 1 tablet by mouth every 12 (twelve) hours.   Yes Historical Provider, MD  simvastatin (ZOCOR) 40 MG tablet Take 40 mg by mouth daily.   Yes Historical Provider, MD  UNABLE TO FIND Take 1 tablet by mouth 2 (two) times daily. Med Name: Macular Degeneration Supplement. 1 tablet twice daily    Yes Historical Provider, MD  cycloSPORINE (RESTASIS) 0.05 % ophthalmic emulsion Place 1 drop into both eyes 2 (two) times daily.    Historical Provider, MD  HYDROcodone-acetaminophen (NORCO/VICODIN) 5-325 MG tablet Take 1 tablet by mouth every 4 (four) hours as needed for moderate pain. Patient not taking: Reported on 02/24/2016 11/30/15   Jola Schmidt, MD    Allergies  Allergen Reactions  . Sulfa Antibiotics     Wheezing, swelling face and ears    Patient Active Problem List   Diagnosis Date Noted  . Difficulty speaking 12/06/2011  . 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 Allergy   . Asthma   . Environmental allergies   . GERD (gastroesophageal reflux disease)   . Glaucoma    pre-Glaucoma  . Hyperlipidemia   . Neuropathy ([place])   . Osteopenia  Past Surgical History:  Procedure Laterality Date  . APPENDECTOMY    . TOTAL ABDOMINAL HYSTERECTOMY  1981    Social History   Social History  . Marital status: Divorced    Spouse name: N/A  . Number of children: N/A  . Years of education: N/A   Occupational History  . Not on file.   Social History Main Topics  . Smoking status: Never Smoker  . Smokeless tobacco: Never Used  . Alcohol use 3.0 - 3.6 oz/week    5 - 6 Cans of beer per week  . Drug use: No  . Sexual activity: Not on file    Other Topics Concern  . Not on file   Social History Narrative  . No narrative on file    Family History  Problem Relation Age of Onset  . Lung cancer Father   . Colon cancer Neg Hx   . Stomach cancer Neg Hx      Review of Systems  Constitutional: Positive for chills. Negative for fever.  HENT: Positive for congestion. Negative for ear pain, nosebleeds, rhinorrhea and sore throat.   Eyes: Negative.   Respiratory: Positive for cough, shortness of breath and wheezing. Negative for hemoptysis.   Cardiovascular: Negative.  Negative for chest pain and leg swelling.  Gastrointestinal: Negative.  Negative for abdominal pain, nausea and vomiting.  Genitourinary: Negative.   Musculoskeletal: Negative.  Negative for myalgias.  Skin: Negative.  Negative for rash.  Neurological: Negative.  Negative for headaches.  Endo/Heme/Allergies: Negative.      Physical Exam  Constitutional: She is oriented to person, place, and time. She appears well-developed and well-nourished.  HENT:  Head: Normocephalic and atraumatic.  Eyes: Conjunctivae and EOM are normal. Pupils are equal, round, and reactive to light.  Neck: Normal range of motion. Neck supple.  Cardiovascular: Normal rate, regular rhythm, normal heart sounds and intact distal pulses.   Pulmonary/Chest: Effort normal. She has wheezes.  Diffuse rhonchi  Abdominal: Soft. Bowel sounds are normal. There is no tenderness.  Musculoskeletal: Normal range of motion.  Neurological: She is alert and oriented to person, place, and time.  Skin: Skin is warm and dry.  Psychiatric: She has a normal mood and affect. Her behavior is normal.  No signs of pneumonia.   ASSESSMENT & PLAN: Ruth Wolfe was seen today for nasal congestion, chest congestion and cough.  Diagnoses and all orders for this visit:  Acute bronchitis, unspecified organism -     albuterol (PROVENTIL) (2.5 MG/3ML) 0.083% nebulizer solution 2.5 mg; Take 3 mLs (2.5 mg total) by  nebulization once. -     ipratropium (ATROVENT) nebulizer solution 0.5 mg; Take 2.5 mLs (0.5 mg total) by nebulization once.  Other orders -     predniSONE (DELTASONE) 20 MG tablet; Take 2 tablets (40 mg total) by mouth daily with breakfast. -     azithromycin (ZITHROMAX) 250 MG tablet; Sig as indicated   Bronchitis instructions given. Re-examined after treatment. Feels better. Better aeration. Meds as prescribed. Return if worse.  Agustina Caroli, MD [facility]

## 2016-02-24 NOTE — Patient Instructions (Signed)
     IF you received an x-ray today, you will receive an invoice from [facility]. Please contact [facility] at [phone] with questions or concerns regarding your invoice.   IF you received labwork today, you will receive an invoice from [facility]. Please contact [facility] at [phone] with questions or concerns regarding your invoice.   Our billing staff will not be able to assist you with questions regarding bills from these companies.  You will be contacted with the lab results as soon as they are available. The fastest way to get your results is to activate your My Chart account. Instructions are located on the last page of this paperwork. If you have not heard from us regarding the results in 2 weeks, please contact this office.     

## 2016-02-26 DIAGNOSIS — L814 Other melanin hyperpigmentation: Secondary | ICD-10-CM | POA: Diagnosis not present

## 2016-02-26 DIAGNOSIS — L570 Actinic keratosis: Secondary | ICD-10-CM | POA: Diagnosis not present

## 2016-02-26 DIAGNOSIS — L821 Other seborrheic keratosis: Secondary | ICD-10-CM | POA: Diagnosis not present

## 2016-02-26 DIAGNOSIS — Z85828 Personal history of other malignant neoplasm of skin: Secondary | ICD-10-CM | POA: Diagnosis not present

## 2016-02-26 DIAGNOSIS — D225 Melanocytic nevi of trunk: Secondary | ICD-10-CM | POA: Diagnosis not present

## 2016-02-26 DIAGNOSIS — D1801 Hemangioma of skin and subcutaneous tissue: Secondary | ICD-10-CM | POA: Diagnosis not present

## 2016-03-19 DIAGNOSIS — S52572D Other intraarticular fracture of lower end of left radius, subsequent encounter for closed fracture with routine healing: Secondary | ICD-10-CM | POA: Diagnosis not present

## 2016-03-19 DIAGNOSIS — G5601 Carpal tunnel syndrome, right upper limb: Secondary | ICD-10-CM | POA: Diagnosis not present

## 2016-03-19 DIAGNOSIS — S52601D Unspecified fracture of lower end of right ulna, subsequent encounter for closed fracture with routine healing: Secondary | ICD-10-CM | POA: Diagnosis not present

## 2016-03-19 DIAGNOSIS — S52501D Unspecified fracture of the lower end of right radius, subsequent encounter for closed fracture with routine healing: Secondary | ICD-10-CM | POA: Diagnosis not present

## 2016-03-30 DIAGNOSIS — Z23 Encounter for immunization: Secondary | ICD-10-CM | POA: Diagnosis not present

## 2016-03-30 DIAGNOSIS — E785 Hyperlipidemia, unspecified: Secondary | ICD-10-CM | POA: Diagnosis not present

## 2016-03-30 DIAGNOSIS — M8580 Other specified disorders of bone density and structure, unspecified site: Secondary | ICD-10-CM | POA: Diagnosis not present

## 2016-03-30 DIAGNOSIS — E559 Vitamin D deficiency, unspecified: Secondary | ICD-10-CM | POA: Diagnosis not present

## 2016-03-30 DIAGNOSIS — Z0000 Encounter for general adult medical examination without abnormal findings: Secondary | ICD-10-CM | POA: Diagnosis not present

## 2016-04-06 DIAGNOSIS — E559 Vitamin D deficiency, unspecified: Secondary | ICD-10-CM | POA: Diagnosis not present

## 2016-04-06 DIAGNOSIS — M8580 Other specified disorders of bone density and structure, unspecified site: Secondary | ICD-10-CM | POA: Diagnosis not present

## 2016-04-06 DIAGNOSIS — H6123 Impacted cerumen, bilateral: Secondary | ICD-10-CM | POA: Diagnosis not present

## 2016-04-06 DIAGNOSIS — E7800 Pure hypercholesterolemia, unspecified: Secondary | ICD-10-CM | POA: Diagnosis not present

## 2016-04-08 DIAGNOSIS — M810 Age-related osteoporosis without current pathological fracture: Secondary | ICD-10-CM | POA: Diagnosis not present

## 2016-04-08 DIAGNOSIS — M859 Disorder of bone density and structure, unspecified: Secondary | ICD-10-CM | POA: Diagnosis not present

## 2016-04-08 DIAGNOSIS — M8580 Other specified disorders of bone density and structure, unspecified site: Secondary | ICD-10-CM | POA: Diagnosis not present

## 2016-04-12 DIAGNOSIS — H353110 Nonexudative age-related macular degeneration, right eye, stage unspecified: Secondary | ICD-10-CM | POA: Diagnosis not present

## 2016-04-12 DIAGNOSIS — H40033 Anatomical narrow angle, bilateral: Secondary | ICD-10-CM | POA: Diagnosis not present

## 2016-04-12 DIAGNOSIS — H2513 Age-related nuclear cataract, bilateral: Secondary | ICD-10-CM | POA: Diagnosis not present

## 2016-04-12 DIAGNOSIS — H43822 Vitreomacular adhesion, left eye: Secondary | ICD-10-CM | POA: Diagnosis not present

## 2016-04-12 DIAGNOSIS — H25013 Cortical age-related cataract, bilateral: Secondary | ICD-10-CM | POA: Diagnosis not present

## 2016-04-12 DIAGNOSIS — H353120 Nonexudative age-related macular degeneration, left eye, stage unspecified: Secondary | ICD-10-CM | POA: Diagnosis not present

## 2016-04-16 DIAGNOSIS — M25532 Pain in left wrist: Secondary | ICD-10-CM | POA: Diagnosis not present

## 2016-04-16 DIAGNOSIS — G5601 Carpal tunnel syndrome, right upper limb: Secondary | ICD-10-CM | POA: Diagnosis not present

## 2016-04-16 DIAGNOSIS — M25531 Pain in right wrist: Secondary | ICD-10-CM | POA: Diagnosis not present

## 2016-05-19 DIAGNOSIS — Z1231 Encounter for screening mammogram for malignant neoplasm of breast: Secondary | ICD-10-CM | POA: Diagnosis not present

## 2016-06-18 DIAGNOSIS — H401122 Primary open-angle glaucoma, left eye, moderate stage: Secondary | ICD-10-CM | POA: Diagnosis not present

## 2016-06-18 DIAGNOSIS — H40033 Anatomical narrow angle, bilateral: Secondary | ICD-10-CM | POA: Diagnosis not present

## 2016-06-18 DIAGNOSIS — H401111 Primary open-angle glaucoma, right eye, mild stage: Secondary | ICD-10-CM | POA: Diagnosis not present

## 2016-06-18 DIAGNOSIS — H04123 Dry eye syndrome of bilateral lacrimal glands: Secondary | ICD-10-CM | POA: Diagnosis not present

## 2016-08-25 ENCOUNTER — Encounter: Payer: Self-pay | Admitting: Gastroenterology

## 2016-09-02 DIAGNOSIS — W57XXXA Bitten or stung by nonvenomous insect and other nonvenomous arthropods, initial encounter: Secondary | ICD-10-CM | POA: Diagnosis not present

## 2016-09-02 DIAGNOSIS — L299 Pruritus, unspecified: Secondary | ICD-10-CM | POA: Diagnosis not present

## 2016-09-02 DIAGNOSIS — L0390 Cellulitis, unspecified: Secondary | ICD-10-CM | POA: Diagnosis not present

## 2016-09-13 ENCOUNTER — Encounter: Payer: Self-pay | Admitting: Gastroenterology

## 2016-10-04 DIAGNOSIS — E559 Vitamin D deficiency, unspecified: Secondary | ICD-10-CM | POA: Diagnosis not present

## 2016-10-04 DIAGNOSIS — E7800 Pure hypercholesterolemia, unspecified: Secondary | ICD-10-CM | POA: Diagnosis not present

## 2016-10-12 DIAGNOSIS — M65312 Trigger thumb, left thumb: Secondary | ICD-10-CM | POA: Diagnosis not present

## 2016-10-14 DIAGNOSIS — M25611 Stiffness of right shoulder, not elsewhere classified: Secondary | ICD-10-CM | POA: Diagnosis not present

## 2016-10-14 DIAGNOSIS — M7541 Impingement syndrome of right shoulder: Secondary | ICD-10-CM | POA: Diagnosis not present

## 2016-10-26 DIAGNOSIS — M7541 Impingement syndrome of right shoulder: Secondary | ICD-10-CM | POA: Diagnosis not present

## 2016-10-28 DIAGNOSIS — M4722 Other spondylosis with radiculopathy, cervical region: Secondary | ICD-10-CM | POA: Diagnosis not present

## 2016-10-28 DIAGNOSIS — Z6828 Body mass index (BMI) 28.0-28.9, adult: Secondary | ICD-10-CM | POA: Diagnosis not present

## 2016-10-28 DIAGNOSIS — R030 Elevated blood-pressure reading, without diagnosis of hypertension: Secondary | ICD-10-CM | POA: Diagnosis not present

## 2016-11-03 ENCOUNTER — Ambulatory Visit (AMBULATORY_SURGERY_CENTER): Payer: Self-pay | Admitting: *Deleted

## 2016-11-03 VITALS — Ht 62.0 in | Wt 157.6 lb

## 2016-11-03 DIAGNOSIS — Z86010 Personal history of colonic polyps: Secondary | ICD-10-CM

## 2016-11-03 MED ORDER — NA SULFATE-K SULFATE-MG SULF 17.5-3.13-1.6 GM/177ML PO SOLN: ORAL | 0 refills | Status: DC

## 2016-11-03 NOTE — Progress Notes (Signed)
No allergies to eggs or soy. No problems with anesthesia.  Pt given Emmi instructions for colonoscopy  No oxygen use  No diet drug use  

## 2016-11-09 DIAGNOSIS — M25611 Stiffness of right shoulder, not elsewhere classified: Secondary | ICD-10-CM | POA: Diagnosis not present

## 2016-11-09 DIAGNOSIS — M7541 Impingement syndrome of right shoulder: Secondary | ICD-10-CM | POA: Diagnosis not present

## 2016-11-17 ENCOUNTER — Encounter: Payer: Self-pay | Admitting: Gastroenterology

## 2016-11-17 ENCOUNTER — Ambulatory Visit (AMBULATORY_SURGERY_CENTER): Payer: Medicare Other | Admitting: Gastroenterology

## 2016-11-17 VITALS — BP 124/69 | HR 83 | Temp 97.3°F | Resp 18 | Ht 62.0 in | Wt 157.0 lb

## 2016-11-17 DIAGNOSIS — K219 Gastro-esophageal reflux disease without esophagitis: Secondary | ICD-10-CM | POA: Diagnosis not present

## 2016-11-17 DIAGNOSIS — Z1211 Encounter for screening for malignant neoplasm of colon: Secondary | ICD-10-CM | POA: Diagnosis not present

## 2016-11-17 DIAGNOSIS — Z86010 Personal history of colonic polyps: Secondary | ICD-10-CM | POA: Diagnosis not present

## 2016-11-17 DIAGNOSIS — J45909 Unspecified asthma, uncomplicated: Secondary | ICD-10-CM | POA: Diagnosis not present

## 2016-11-17 MED ORDER — SODIUM CHLORIDE 0.9 % IV SOLN: 500.0000 mL | INTRAVENOUS | Status: DC

## 2016-11-17 NOTE — Progress Notes (Signed)
Pt's states no medical or surgical changes since previsit or office visit. 

## 2016-11-17 NOTE — Patient Instructions (Addendum)
Impression/recommendations:  Diverticulosis (handout given) Hemorrhoids (handout given)  YOU HAD AN ENDOSCOPIC PROCEDURE TODAY AT [facility]:   Refer to the procedure report that was given to you for any specific questions about what was found during the examination.  If the procedure report does not answer your questions, please call your gastroenterologist to clarify.  If you requested that your care partner not be given the details of your procedure findings, then the procedure report has been included in a sealed envelope for you to review at your convenience later.  YOU SHOULD EXPECT: Some feelings of bloating in the abdomen. Passage of more gas than usual.  Walking can help get rid of the air that was put into your GI tract during the procedure and reduce the bloating. If you had a lower endoscopy (such as a colonoscopy or flexible sigmoidoscopy) you may notice spotting of blood in your stool or on the toilet paper. If you underwent a bowel prep for your procedure, you may not have a normal bowel movement for a few days.  Please Note:  You might notice some irritation and congestion in your nose or some drainage.  This is from the oxygen used during your procedure.  There is no need for concern and it should clear up in a day or so.  SYMPTOMS TO REPORT IMMEDIATELY:   Following lower endoscopy (colonoscopy or flexible sigmoidoscopy):  Excessive amounts of blood in the stool  Significant tenderness or worsening of abdominal pains  Swelling of the abdomen that is new, acute  Fever of 100F or higher  For urgent or emergent issues, a gastroenterologist can be reached at any hour by calling [phone].   DIET:  We do recommend a small meal at first, but then you may proceed to your regular diet.  Drink plenty of fluids but you should avoid alcoholic beverages for 24 hours.  ACTIVITY:  You should plan to take it easy for the rest of today and you should NOT DRIVE or use  heavy machinery until tomorrow (because of the sedation medicines used during the test).    FOLLOW UP: Our staff will call the number listed on your records the next business day following your procedure to check on you and address any questions or concerns that you may have regarding the information given to you following your procedure. If we do not reach you, we will leave a message.  However, if you are feeling well and you are not experiencing any problems, there is no need to return our call.  We will assume that you have returned to your regular daily activities without incident.  If any biopsies were taken you will be contacted by phone or by letter within the next 1-3 weeks.  Please call us at [phone] if you have not heard about the biopsies in 3 weeks.    SIGNATURES/CONFIDENTIALITY: You and/or your care partner have signed paperwork which will be entered into your electronic medical record.  These signatures attest to the fact that that the information above on your After Visit Summary has been reviewed and is understood.  Full responsibility of the confidentiality of this discharge information lies with you and/or your care-partner.

## 2016-11-17 NOTE — Op Note (Addendum)
[place] Patient Name: Ruth Wolfe Procedure Date: 11/17/2016 8:05 AM MRN: [id number] Endoscopist: Ladene Artist , MD Age: 71 Referring MD:  Date of Birth: [birth year] Gender: Female Account #: [id number] Procedure:                Colonoscopy Indications:              Surveillance: Personal history of adenomatous                            polyps on last colonoscopy 5 years ago Medicines:                Monitored Anesthesia Care Procedure:                Pre-Anesthesia Assessment:                           - Prior to the procedure, a History and Physical                            was performed, and patient medications and                            allergies were reviewed. The patient's tolerance of                            previous anesthesia was also reviewed. The risks                            and benefits of the procedure and the sedation                            options and risks were discussed with the patient.                            All questions were answered, and informed consent                            was obtained. Prior Anticoagulants: The patient has                            taken no previous anticoagulant or antiplatelet                            agents. ASA Grade Assessment: II - A patient with                            mild systemic disease. After reviewing the risks                            and benefits, the patient was deemed in                            satisfactory condition to undergo the procedure.  After obtaining informed consent, the colonoscope                            was passed under direct vision. Throughout the                            procedure, the patient's blood pressure, pulse, and                            oxygen saturations were monitored continuously. The                            Colonoscope was introduced through the anus and                            advanced to the the cecum,  identified by                            appendiceal orifice and ileocecal valve. The                            ileocecal valve, appendiceal orifice, and rectum                            were photographed. The quality of the bowel                            preparation was good. The patient tolerated the                            procedure well. The colonoscopy was somewhat                            difficult due to a tortuous colon. Successful                            completion of the procedure was aided by changing                            the patient's position, using manual pressure,                            withdrawing and reinserting the scope,                            straightening and shortening the scope to obtain                            bowel loop reduction and applying abdominal                            pressure. Scope In: 8:10:14 AM Scope Out: 8:35:51 AM Scope Withdrawal Time: 0 hours 9 minutes 15 seconds  Total Procedure Duration: 0 hours 25 minutes 37 seconds  Findings:                 The perianal and digital rectal examinations were                            normal.                           Multiple small-mouthed diverticula were found in                            the left colon. There was narrowing of the colon in                            association with the diverticular opening. There                            was evidence of diverticular spasm. The lumen was                            narrowed and tortuous. Peri-diverticular erythema                            was seen. There was evidence of an impacted                            diverticulum. There was no evidence of diverticular                            bleeding.                           Internal hemorrhoids were found during                            retroflexion. The hemorrhoids were small and Grade                            I (internal hemorrhoids that do not prolapse).                            The exam was otherwise without abnormality on                            direct and retroflexion views. Complications:            No immediate complications. Estimated blood loss:                            None. Estimated Blood Loss:     Estimated blood loss: none. Impression:               - Moderate diverticulosis in the left colon. There                            was narrowing of the colon in association with the  diverticular opening. There was evidence of                            diverticular spasm. Peri-diverticular erythema was                            seen. There was evidence of an impacted                            diverticulum. There was no evidence of diverticular                            bleeding.                           - Internal hemorrhoids.                           - The examination was otherwise normal on direct                            and retroflexion views.                           - No specimens collected. Recommendation:           - Patient has a contact number available for                            emergencies. The signs and symptoms of potential                            delayed complications were discussed with the                            patient. Return to normal activities tomorrow.                            Written discharge instructions were provided to the                            patient.                           - High fiber diet.                           - Continue present medications.                           - No repeat colonoscopy due to age and the absence                            of polyps. Ladene Artist, MD 11/17/2016 8:43:40 AM This report has been signed electronically.

## 2016-11-17 NOTE — Progress Notes (Signed)
Report given to PACU, vss 

## 2016-11-18 ENCOUNTER — Telehealth: Payer: Self-pay

## 2016-11-18 NOTE — Telephone Encounter (Signed)
  Follow up Call-  Call back number 11/17/2016  Post procedure Call Back phone  # [phone]  Permission to leave phone message Yes  Some recent data might be hidden     Patient questions:  Do you have a fever, pain , or abdominal swelling? No. Pain Score  0 *  Have you tolerated food without any problems? Yes.    Have you been able to return to your normal activities? Yes.    Do you have any questions about your discharge instructions: Diet   No. Medications  No. Follow up visit  No.  Do you have questions or concerns about your Care? No.  Actions: * If pain score is 4 or above: No action needed, pain <4.

## 2016-11-18 NOTE — Telephone Encounter (Signed)
Entered in error

## 2016-12-08 DIAGNOSIS — L309 Dermatitis, unspecified: Secondary | ICD-10-CM | POA: Diagnosis not present

## 2016-12-08 DIAGNOSIS — L089 Local infection of the skin and subcutaneous tissue, unspecified: Secondary | ICD-10-CM | POA: Diagnosis not present

## 2016-12-08 DIAGNOSIS — W57XXXA Bitten or stung by nonvenomous insect and other nonvenomous arthropods, initial encounter: Secondary | ICD-10-CM | POA: Diagnosis not present

## 2017-01-20 IMAGING — MR MR CERVICAL SPINE WO/W CM
5 of 8 series · 28 of 48 positions shown · IV contrast (multihance)
Comparison: CT scan of the neck dated 04/15/2015

CLINICAL DATA: Neck pain with numbness and tingling in both hands.
Tingling in the left arm with the patient turns her head to the
left.

Creatinine was obtained on site at [HOSPITAL] at [HOSPITAL].
Results: Creatinine 0.8 mg/dL.
EXAM:
MRI CERVICAL SPINE WITHOUT AND WITH CONTRAST
TECHNIQUE: Multiplanar and multiecho pulse sequences of the cervical spine, to
include the craniocervical junction and cervicothoracic junction,
were obtained without and with intravenous contrast.
CONTRAST:  15mL MULTIHANCE GADOBENATE DIMEGLUMINE 529 MG/ML IV SOLN

[Series 4: T1 · sagittal · 3.0mm · 0.41mm/px · 4 of 13 slices shown (1 of 2)]
[im 1/13]
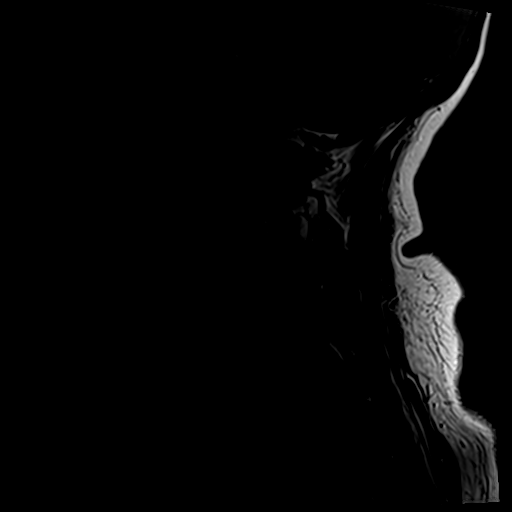
[im 5/13]
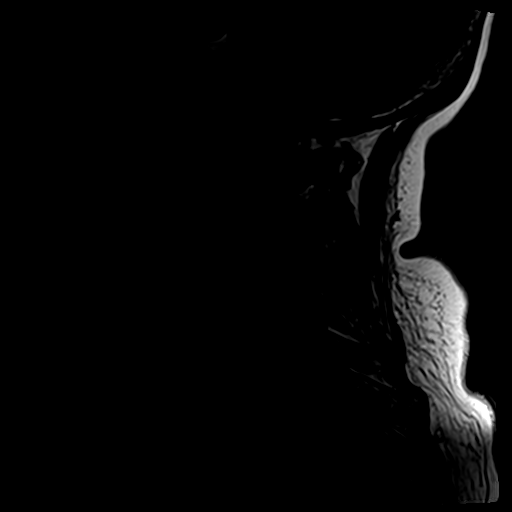
[im 9/13]
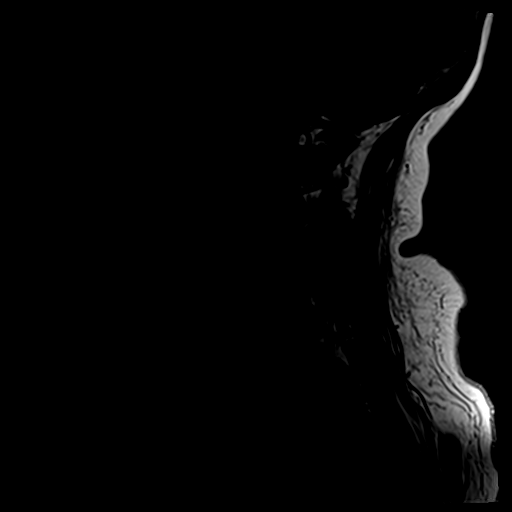
[im 13/13]
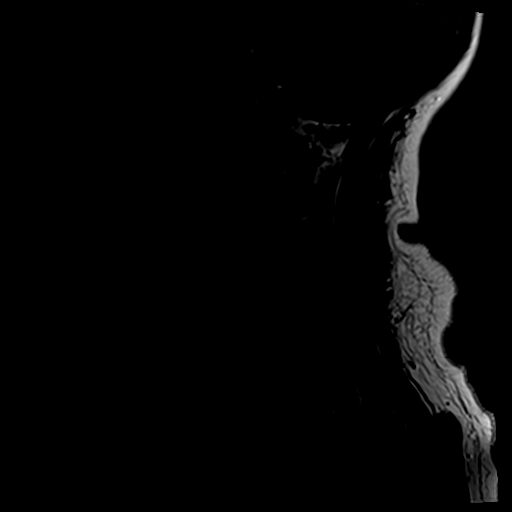

[Series 5: T2 · sagittal · 3.0mm · 0.66mm/px · 4 of 13 slices shown (1 of 2)]
[im 1/13]
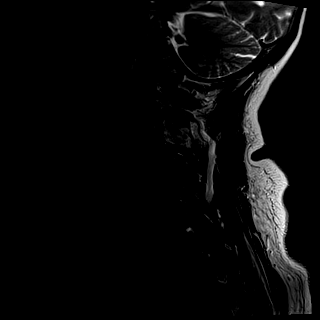
[im 5/13]
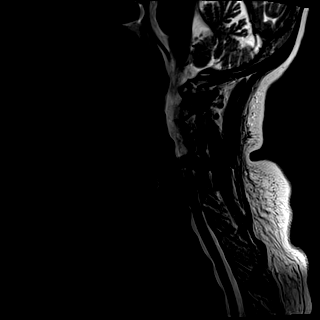
[im 9/13]
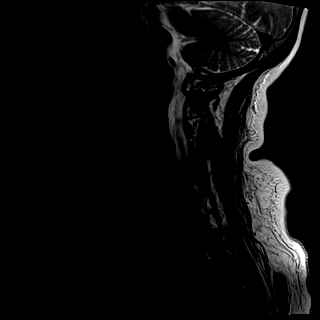
[im 13/13]
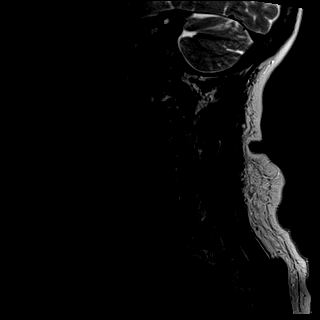

[Series 7: T2 · axial · 3.0mm · 0.70mm/px · z∈[-66,+29]mm · 8 of 28 slices shown (2 of 2)]
[im 1/28]
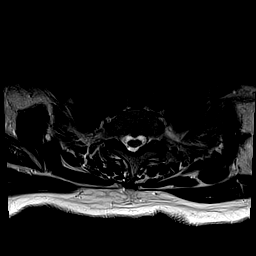
[im 4/28]
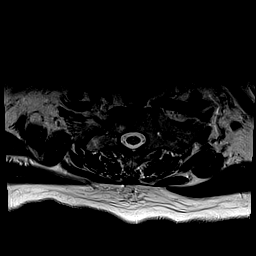
[im 8/28]
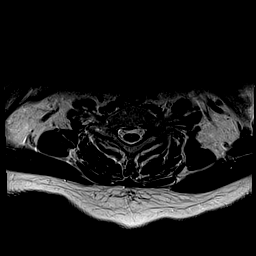
[im 12/28]
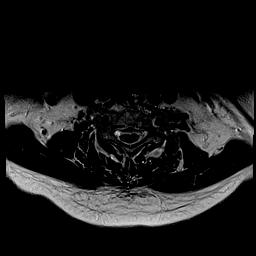
[im 16/28]
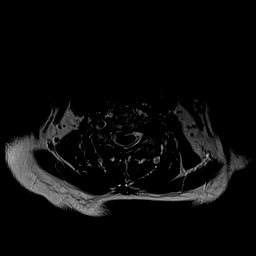
[im 20/28]
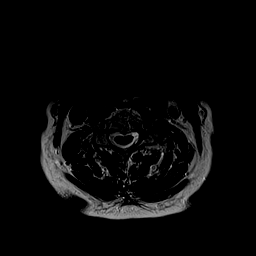
[im 24/28]
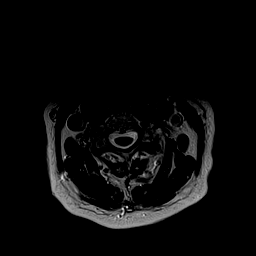
[im 28/28]
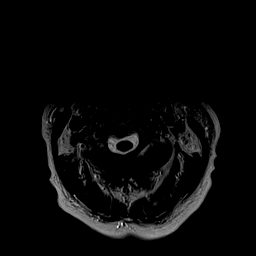

[Series 8: T1 · axial · 3.0mm · 0.35mm/px · z∈[-66,+29]mm · 8 of 28 slices shown (2 of 2)]
[im 1/28]
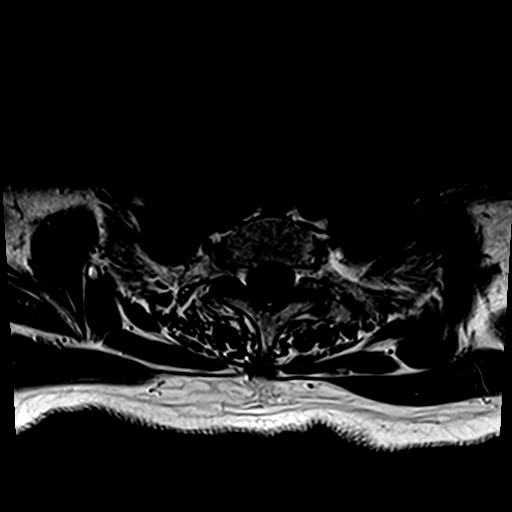
[im 4/28]
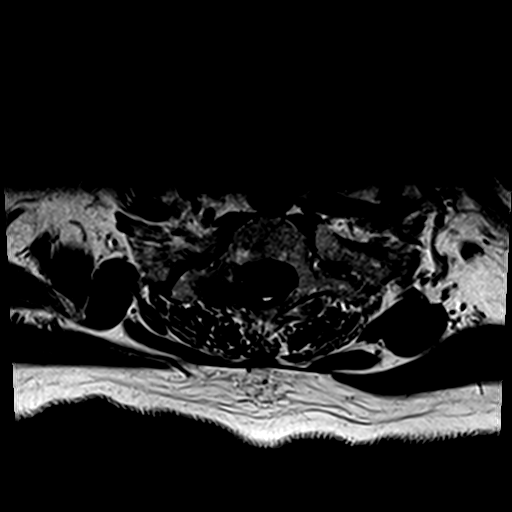
[im 8/28]
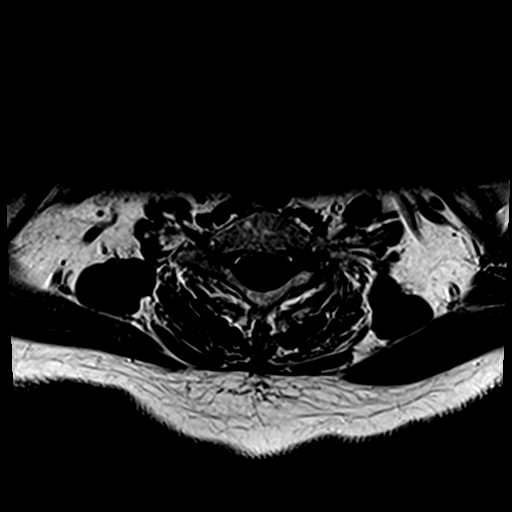
[im 12/28]
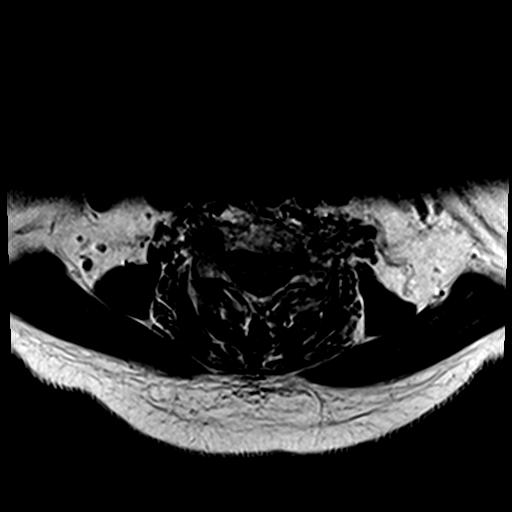
[im 16/28]
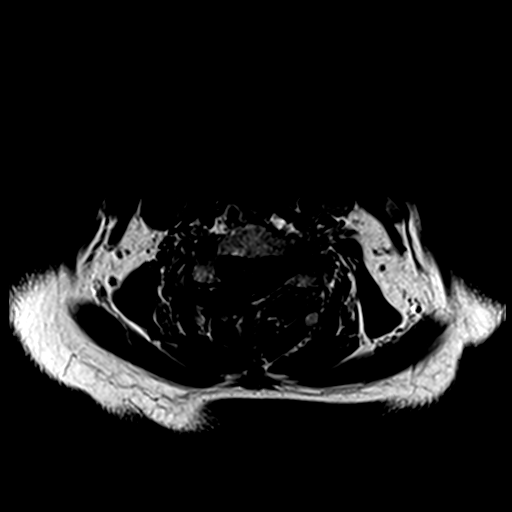
[im 20/28]
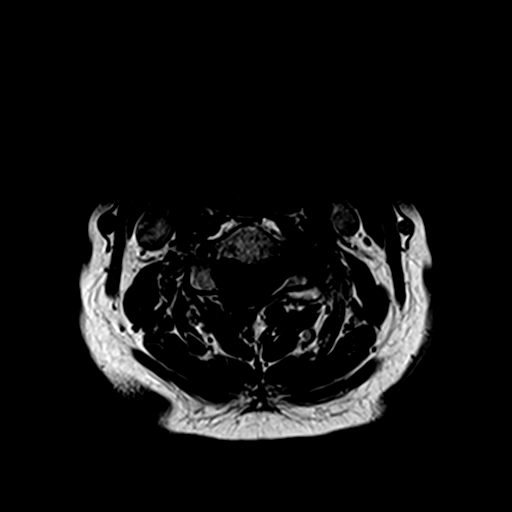
[im 24/28]
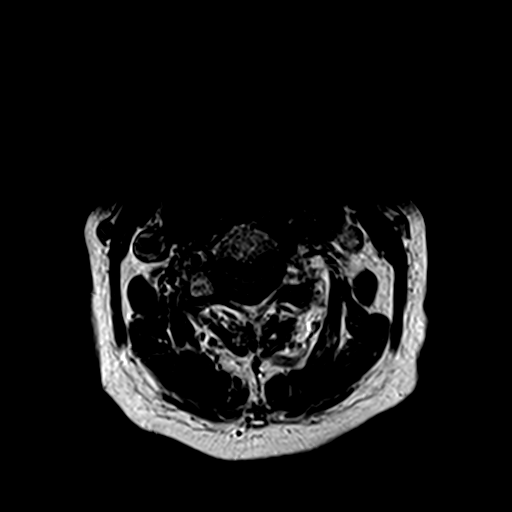
[im 28/28]
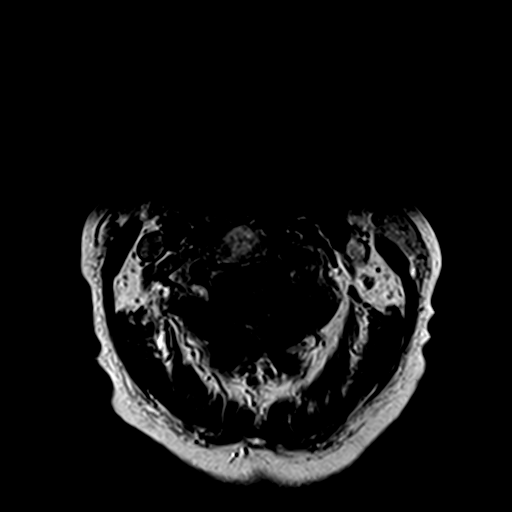

[Series 9: T1 fat-sat post-contrast · sagittal · 3.0mm · 0.66mm/px · 4 of 13 slices shown]
[im 1/13]
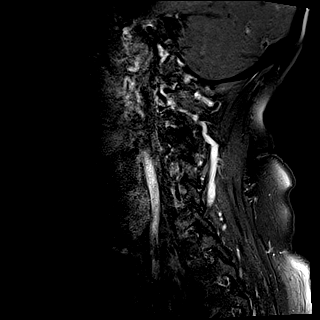
[im 5/13]
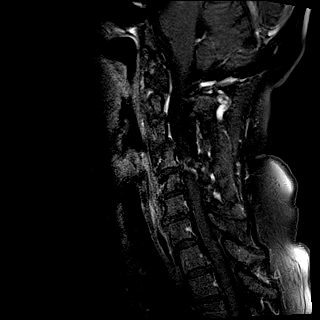
[im 9/13]
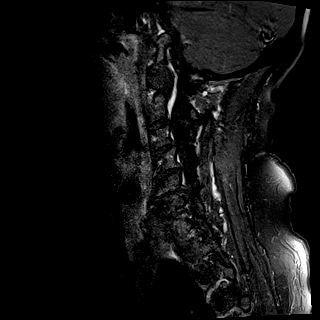
[im 13/13]
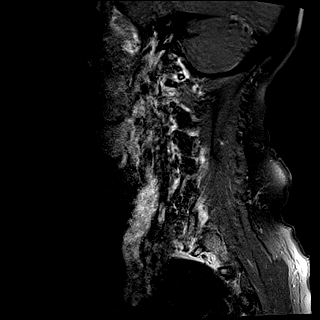

[28 of 48 positions shown; findings below may reference images not displayed]

FINDINGS: Alignment: 1.5 mm retrolisthesis of C3 on C4.

Vertebrae: Degenerative changes of the vertebral endplates from C3-4
through C7-T1.

Cord: Slight compression of the left side of the spinal cord at C4-5
and of the right side of the spinal cord at C5-6 with subtle focal
myelopathy in the right side of the cord seen on image 16 of series
6 at C5-6.

Posterior Fossa, vertebral arteries, paraspinal tissues: Normal.

Disc levels:

Craniocervical junction through C2-3: Chronic hypertrophy of the
transverse ligament with narrowing of the AP dimension of the spinal
canal to 8 mm, unchanged since the prior CT scan. There is slight
compression of the spinal cord without myelopathy. Moderate left
facet arthritis at C2-3. No disc bulging or protrusion.

C3-4: 1.5 mm spondylolisthesis. Auto fusion of the left facet joint.
No disc bulging. Slight narrowing of the left neural foramen.

C4-5: Broad-based soft disc protrusion. Arthritis of the left facet
joint and hypertrophy of the ligamentum flavum combine with the disc
protrusion to compress the left side of the spinal cord in narrow
the left lateral recess and left neural foramen. This could affect
the left C5 nerve there is marked enhancement of the left facet
joint and adjacent soft tissues including the left neural foramen
after contrast administration.

C5-6: Chronic disc space narrowing with broad-based disc osteophyte
complex asymmetric to the right with compression of the right side
of the spinal cord with focal myelopathy on image 16 of series 6.
Right foraminal stenosis which could affect the right C6 nerve.

C6-7: Chronic disc space narrowing. Broad-based disc osteophyte
complex primarily central and to the left creating severe left
foraminal stenosis which should affect the left C7 nerve.

C7-T1: Subtle anterolisthesis. Moderately severe right facet
arthritis. No foraminal stenosis.
IMPRESSION: 1. Degenerative disc and joint disease at C4-5 to the left which
could affect the left C5 nerve.
2. Left foraminal stenosis at C6-7 which should affect the left C7
nerve.
3. Disc osteophyte complex compresses the right side of the cord and
narrows the right neural foramen at C5-6 which could affect the
right C6 nerve
4. Severe arthritis and inflammation of the left facet joint at
C4-5.

## 2017-03-03 DIAGNOSIS — H353132 Nonexudative age-related macular degeneration, bilateral, intermediate dry stage: Secondary | ICD-10-CM | POA: Diagnosis not present

## 2017-03-03 DIAGNOSIS — H401111 Primary open-angle glaucoma, right eye, mild stage: Secondary | ICD-10-CM | POA: Diagnosis not present

## 2017-03-03 DIAGNOSIS — H43823 Vitreomacular adhesion, bilateral: Secondary | ICD-10-CM | POA: Diagnosis not present

## 2017-03-03 DIAGNOSIS — H401122 Primary open-angle glaucoma, left eye, moderate stage: Secondary | ICD-10-CM | POA: Diagnosis not present

## 2017-04-05 DIAGNOSIS — L570 Actinic keratosis: Secondary | ICD-10-CM | POA: Diagnosis not present

## 2017-04-05 DIAGNOSIS — Z23 Encounter for immunization: Secondary | ICD-10-CM | POA: Diagnosis not present

## 2017-04-05 DIAGNOSIS — D1801 Hemangioma of skin and subcutaneous tissue: Secondary | ICD-10-CM | POA: Diagnosis not present

## 2017-04-05 DIAGNOSIS — Z85828 Personal history of other malignant neoplasm of skin: Secondary | ICD-10-CM | POA: Diagnosis not present

## 2017-04-05 DIAGNOSIS — L821 Other seborrheic keratosis: Secondary | ICD-10-CM | POA: Diagnosis not present

## 2017-04-05 DIAGNOSIS — D225 Melanocytic nevi of trunk: Secondary | ICD-10-CM | POA: Diagnosis not present

## 2017-04-05 DIAGNOSIS — L814 Other melanin hyperpigmentation: Secondary | ICD-10-CM | POA: Diagnosis not present

## 2017-04-08 DIAGNOSIS — E559 Vitamin D deficiency, unspecified: Secondary | ICD-10-CM | POA: Diagnosis not present

## 2017-04-08 DIAGNOSIS — E7800 Pure hypercholesterolemia, unspecified: Secondary | ICD-10-CM | POA: Diagnosis not present

## 2017-04-08 DIAGNOSIS — N182 Chronic kidney disease, stage 2 (mild): Secondary | ICD-10-CM | POA: Diagnosis not present

## 2017-04-08 DIAGNOSIS — Z0000 Encounter for general adult medical examination without abnormal findings: Secondary | ICD-10-CM | POA: Diagnosis not present

## 2017-04-14 DIAGNOSIS — F419 Anxiety disorder, unspecified: Secondary | ICD-10-CM | POA: Diagnosis not present

## 2017-04-14 DIAGNOSIS — J4520 Mild intermittent asthma, uncomplicated: Secondary | ICD-10-CM | POA: Diagnosis not present

## 2017-04-14 DIAGNOSIS — I6529 Occlusion and stenosis of unspecified carotid artery: Secondary | ICD-10-CM | POA: Diagnosis not present

## 2017-04-14 DIAGNOSIS — E7800 Pure hypercholesterolemia, unspecified: Secondary | ICD-10-CM | POA: Diagnosis not present

## 2017-04-14 DIAGNOSIS — E663 Overweight: Secondary | ICD-10-CM | POA: Diagnosis not present

## 2017-04-21 DIAGNOSIS — I6529 Occlusion and stenosis of unspecified carotid artery: Secondary | ICD-10-CM | POA: Diagnosis not present

## 2017-04-21 DIAGNOSIS — I6523 Occlusion and stenosis of bilateral carotid arteries: Secondary | ICD-10-CM | POA: Diagnosis not present

## 2017-05-05 DIAGNOSIS — F419 Anxiety disorder, unspecified: Secondary | ICD-10-CM | POA: Diagnosis not present

## 2017-05-05 DIAGNOSIS — I6529 Occlusion and stenosis of unspecified carotid artery: Secondary | ICD-10-CM | POA: Diagnosis not present

## 2017-05-25 DIAGNOSIS — Z1231 Encounter for screening mammogram for malignant neoplasm of breast: Secondary | ICD-10-CM | POA: Diagnosis not present

## 2017-06-07 DIAGNOSIS — Y93H2 Activity, gardening and landscaping: Secondary | ICD-10-CM | POA: Diagnosis not present

## 2017-06-07 DIAGNOSIS — S81812A Laceration without foreign body, left lower leg, initial encounter: Secondary | ICD-10-CM | POA: Diagnosis not present

## 2017-06-21 DIAGNOSIS — S81812D Laceration without foreign body, left lower leg, subsequent encounter: Secondary | ICD-10-CM | POA: Diagnosis not present

## 2017-07-01 DIAGNOSIS — R6889 Other general symptoms and signs: Secondary | ICD-10-CM | POA: Diagnosis not present

## 2017-07-01 DIAGNOSIS — J101 Influenza due to other identified influenza virus with other respiratory manifestations: Secondary | ICD-10-CM | POA: Diagnosis not present

## 2017-07-01 DIAGNOSIS — J4541 Moderate persistent asthma with (acute) exacerbation: Secondary | ICD-10-CM | POA: Diagnosis not present

## 2017-09-25 DIAGNOSIS — L259 Unspecified contact dermatitis, unspecified cause: Secondary | ICD-10-CM | POA: Diagnosis not present

## 2017-09-27 ENCOUNTER — Other Ambulatory Visit: Payer: Self-pay | Admitting: Neurological Surgery

## 2017-09-27 DIAGNOSIS — H04123 Dry eye syndrome of bilateral lacrimal glands: Secondary | ICD-10-CM | POA: Diagnosis not present

## 2017-09-27 DIAGNOSIS — M4722 Other spondylosis with radiculopathy, cervical region: Secondary | ICD-10-CM

## 2017-09-27 DIAGNOSIS — H401122 Primary open-angle glaucoma, left eye, moderate stage: Secondary | ICD-10-CM | POA: Diagnosis not present

## 2017-09-27 DIAGNOSIS — H40033 Anatomical narrow angle, bilateral: Secondary | ICD-10-CM | POA: Diagnosis not present

## 2017-09-27 DIAGNOSIS — H401111 Primary open-angle glaucoma, right eye, mild stage: Secondary | ICD-10-CM | POA: Diagnosis not present

## 2017-10-05 ENCOUNTER — Ambulatory Visit (HOSPITAL_COMMUNITY)
Admission: RE | Admit: 2017-10-05 | Discharge: 2017-10-05 | Disposition: A | Discharge status: Home / Self Care | Payer: Medicare Other | Source: Ambulatory Visit | Attending: Vascular Surgery | Admitting: Vascular Surgery

## 2017-10-05 ENCOUNTER — Other Ambulatory Visit (HOSPITAL_COMMUNITY): Payer: Self-pay | Admitting: Internal Medicine

## 2017-10-05 DIAGNOSIS — M79662 Pain in left lower leg: Secondary | ICD-10-CM | POA: Insufficient documentation

## 2017-10-05 DIAGNOSIS — L03116 Cellulitis of left lower limb: Secondary | ICD-10-CM | POA: Diagnosis not present

## 2017-10-05 DIAGNOSIS — M7989 Other specified soft tissue disorders: Secondary | ICD-10-CM

## 2017-10-07 ENCOUNTER — Ambulatory Visit
Admission: RE | Admit: 2017-10-07 | Discharge: 2017-10-07 | Disposition: A | Discharge status: Home / Self Care | Payer: Medicare Other | Source: Ambulatory Visit | Attending: Neurological Surgery | Admitting: Neurological Surgery

## 2017-10-07 DIAGNOSIS — M7989 Other specified soft tissue disorders: Secondary | ICD-10-CM | POA: Diagnosis not present

## 2017-10-07 DIAGNOSIS — L03116 Cellulitis of left lower limb: Secondary | ICD-10-CM | POA: Diagnosis not present

## 2017-10-07 DIAGNOSIS — M4722 Other spondylosis with radiculopathy, cervical region: Secondary | ICD-10-CM

## 2017-10-07 DIAGNOSIS — M79662 Pain in left lower leg: Secondary | ICD-10-CM | POA: Diagnosis not present

## 2017-10-07 DIAGNOSIS — M4802 Spinal stenosis, cervical region: Secondary | ICD-10-CM | POA: Diagnosis not present

## 2017-10-13 DIAGNOSIS — M4722 Other spondylosis with radiculopathy, cervical region: Secondary | ICD-10-CM | POA: Diagnosis not present

## 2017-11-17 DIAGNOSIS — L821 Other seborrheic keratosis: Secondary | ICD-10-CM | POA: Diagnosis not present

## 2017-11-17 DIAGNOSIS — D485 Neoplasm of uncertain behavior of skin: Secondary | ICD-10-CM | POA: Diagnosis not present

## 2017-11-17 DIAGNOSIS — D045 Carcinoma in situ of skin of trunk: Secondary | ICD-10-CM | POA: Diagnosis not present

## 2017-11-17 DIAGNOSIS — L570 Actinic keratosis: Secondary | ICD-10-CM | POA: Diagnosis not present

## 2017-11-17 DIAGNOSIS — C44529 Squamous cell carcinoma of skin of other part of trunk: Secondary | ICD-10-CM | POA: Diagnosis not present

## 2017-12-01 DIAGNOSIS — D045 Carcinoma in situ of skin of trunk: Secondary | ICD-10-CM | POA: Diagnosis not present

## 2017-12-01 DIAGNOSIS — Z23 Encounter for immunization: Secondary | ICD-10-CM | POA: Diagnosis not present

## 2018-01-16 DIAGNOSIS — L299 Pruritus, unspecified: Secondary | ICD-10-CM | POA: Diagnosis not present

## 2018-02-08 DIAGNOSIS — L309 Dermatitis, unspecified: Secondary | ICD-10-CM | POA: Diagnosis not present

## 2018-02-08 DIAGNOSIS — Z23 Encounter for immunization: Secondary | ICD-10-CM | POA: Diagnosis not present

## 2018-02-08 DIAGNOSIS — L570 Actinic keratosis: Secondary | ICD-10-CM | POA: Diagnosis not present

## 2018-03-14 DIAGNOSIS — H43823 Vitreomacular adhesion, bilateral: Secondary | ICD-10-CM | POA: Diagnosis not present

## 2018-03-14 DIAGNOSIS — H401111 Primary open-angle glaucoma, right eye, mild stage: Secondary | ICD-10-CM | POA: Diagnosis not present

## 2018-03-14 DIAGNOSIS — H353132 Nonexudative age-related macular degeneration, bilateral, intermediate dry stage: Secondary | ICD-10-CM | POA: Diagnosis not present

## 2018-03-14 DIAGNOSIS — H401122 Primary open-angle glaucoma, left eye, moderate stage: Secondary | ICD-10-CM | POA: Diagnosis not present

## 2018-04-05 DIAGNOSIS — D225 Melanocytic nevi of trunk: Secondary | ICD-10-CM | POA: Diagnosis not present

## 2018-04-05 DIAGNOSIS — L821 Other seborrheic keratosis: Secondary | ICD-10-CM | POA: Diagnosis not present

## 2018-04-05 DIAGNOSIS — Z23 Encounter for immunization: Secondary | ICD-10-CM | POA: Diagnosis not present

## 2018-04-05 DIAGNOSIS — L570 Actinic keratosis: Secondary | ICD-10-CM | POA: Diagnosis not present

## 2018-04-05 DIAGNOSIS — L820 Inflamed seborrheic keratosis: Secondary | ICD-10-CM | POA: Diagnosis not present

## 2018-04-05 DIAGNOSIS — L814 Other melanin hyperpigmentation: Secondary | ICD-10-CM | POA: Diagnosis not present

## 2018-04-05 DIAGNOSIS — L309 Dermatitis, unspecified: Secondary | ICD-10-CM | POA: Diagnosis not present

## 2018-04-05 DIAGNOSIS — Z85828 Personal history of other malignant neoplasm of skin: Secondary | ICD-10-CM | POA: Diagnosis not present

## 2018-04-14 DIAGNOSIS — E039 Hypothyroidism, unspecified: Secondary | ICD-10-CM | POA: Diagnosis not present

## 2018-04-14 DIAGNOSIS — E559 Vitamin D deficiency, unspecified: Secondary | ICD-10-CM | POA: Diagnosis not present

## 2018-04-14 DIAGNOSIS — Z0000 Encounter for general adult medical examination without abnormal findings: Secondary | ICD-10-CM | POA: Diagnosis not present

## 2018-04-14 DIAGNOSIS — E7800 Pure hypercholesterolemia, unspecified: Secondary | ICD-10-CM | POA: Diagnosis not present

## 2018-04-14 DIAGNOSIS — I10 Essential (primary) hypertension: Secondary | ICD-10-CM | POA: Diagnosis not present

## 2018-04-25 DIAGNOSIS — Z0000 Encounter for general adult medical examination without abnormal findings: Secondary | ICD-10-CM | POA: Diagnosis not present

## 2018-04-25 DIAGNOSIS — M2550 Pain in unspecified joint: Secondary | ICD-10-CM | POA: Diagnosis not present

## 2018-04-25 DIAGNOSIS — M25559 Pain in unspecified hip: Secondary | ICD-10-CM | POA: Diagnosis not present

## 2018-04-25 DIAGNOSIS — M25551 Pain in right hip: Secondary | ICD-10-CM | POA: Diagnosis not present

## 2018-04-25 DIAGNOSIS — E7800 Pure hypercholesterolemia, unspecified: Secondary | ICD-10-CM | POA: Diagnosis not present

## 2018-04-25 DIAGNOSIS — G4700 Insomnia, unspecified: Secondary | ICD-10-CM | POA: Diagnosis not present

## 2018-04-25 DIAGNOSIS — M1611 Unilateral primary osteoarthritis, right hip: Secondary | ICD-10-CM | POA: Diagnosis not present

## 2018-04-25 DIAGNOSIS — E559 Vitamin D deficiency, unspecified: Secondary | ICD-10-CM | POA: Diagnosis not present

## 2018-04-27 DIAGNOSIS — H401111 Primary open-angle glaucoma, right eye, mild stage: Secondary | ICD-10-CM | POA: Diagnosis not present

## 2018-04-27 DIAGNOSIS — H401122 Primary open-angle glaucoma, left eye, moderate stage: Secondary | ICD-10-CM | POA: Diagnosis not present

## 2018-04-27 DIAGNOSIS — H04123 Dry eye syndrome of bilateral lacrimal glands: Secondary | ICD-10-CM | POA: Diagnosis not present

## 2018-04-27 DIAGNOSIS — H01003 Unspecified blepharitis right eye, unspecified eyelid: Secondary | ICD-10-CM | POA: Diagnosis not present

## 2018-05-01 DIAGNOSIS — L239 Allergic contact dermatitis, unspecified cause: Secondary | ICD-10-CM | POA: Diagnosis not present

## 2018-05-05 DIAGNOSIS — L259 Unspecified contact dermatitis, unspecified cause: Secondary | ICD-10-CM | POA: Diagnosis not present

## 2018-06-22 DIAGNOSIS — H6120 Impacted cerumen, unspecified ear: Secondary | ICD-10-CM | POA: Diagnosis not present

## 2018-08-14 DIAGNOSIS — Z1231 Encounter for screening mammogram for malignant neoplasm of breast: Secondary | ICD-10-CM | POA: Diagnosis not present

## 2018-08-15 DIAGNOSIS — L308 Other specified dermatitis: Secondary | ICD-10-CM | POA: Diagnosis not present

## 2018-09-29 DIAGNOSIS — H43823 Vitreomacular adhesion, bilateral: Secondary | ICD-10-CM | POA: Diagnosis not present

## 2018-09-29 DIAGNOSIS — H04123 Dry eye syndrome of bilateral lacrimal glands: Secondary | ICD-10-CM | POA: Diagnosis not present

## 2018-09-29 DIAGNOSIS — H401111 Primary open-angle glaucoma, right eye, mild stage: Secondary | ICD-10-CM | POA: Diagnosis not present

## 2018-09-29 DIAGNOSIS — H401122 Primary open-angle glaucoma, left eye, moderate stage: Secondary | ICD-10-CM | POA: Diagnosis not present

## 2018-11-10 DIAGNOSIS — H401122 Primary open-angle glaucoma, left eye, moderate stage: Secondary | ICD-10-CM | POA: Diagnosis not present

## 2018-11-10 DIAGNOSIS — H401111 Primary open-angle glaucoma, right eye, mild stage: Secondary | ICD-10-CM | POA: Diagnosis not present

## 2019-03-02 DIAGNOSIS — E559 Vitamin D deficiency, unspecified: Secondary | ICD-10-CM

## 2019-03-02 DIAGNOSIS — E538 Deficiency of other specified B group vitamins: Secondary | ICD-10-CM

## 2019-03-02 DIAGNOSIS — H35039 Hypertensive retinopathy, unspecified eye: Secondary | ICD-10-CM

## 2019-03-02 DIAGNOSIS — H269 Unspecified cataract: Secondary | ICD-10-CM

## 2019-03-02 HISTORY — DX: Unspecified cataract: H26.9

## 2019-03-02 HISTORY — DX: Hypertensive retinopathy, unspecified eye: H35.039

## 2019-03-02 HISTORY — DX: Deficiency of other specified B group vitamins: E53.8

## 2019-03-02 HISTORY — DX: Vitamin D deficiency, unspecified: E55.9

## 2019-05-17 DIAGNOSIS — H04123 Dry eye syndrome of bilateral lacrimal glands: Secondary | ICD-10-CM | POA: Diagnosis not present

## 2019-05-17 DIAGNOSIS — H401122 Primary open-angle glaucoma, left eye, moderate stage: Secondary | ICD-10-CM | POA: Diagnosis not present

## 2019-05-17 DIAGNOSIS — H43823 Vitreomacular adhesion, bilateral: Secondary | ICD-10-CM | POA: Diagnosis not present

## 2019-05-17 DIAGNOSIS — H401111 Primary open-angle glaucoma, right eye, mild stage: Secondary | ICD-10-CM | POA: Diagnosis not present

## 2019-05-21 DIAGNOSIS — E7800 Pure hypercholesterolemia, unspecified: Secondary | ICD-10-CM | POA: Diagnosis not present

## 2019-05-21 DIAGNOSIS — Z0001 Encounter for general adult medical examination with abnormal findings: Secondary | ICD-10-CM | POA: Diagnosis not present

## 2019-05-21 DIAGNOSIS — E039 Hypothyroidism, unspecified: Secondary | ICD-10-CM | POA: Diagnosis not present

## 2019-05-21 DIAGNOSIS — E559 Vitamin D deficiency, unspecified: Secondary | ICD-10-CM | POA: Diagnosis not present

## 2019-05-21 DIAGNOSIS — I10 Essential (primary) hypertension: Secondary | ICD-10-CM | POA: Diagnosis not present

## 2019-05-21 DIAGNOSIS — Z0000 Encounter for general adult medical examination without abnormal findings: Secondary | ICD-10-CM | POA: Diagnosis not present

## 2019-05-23 DIAGNOSIS — L578 Other skin changes due to chronic exposure to nonionizing radiation: Secondary | ICD-10-CM | POA: Diagnosis not present

## 2019-05-23 DIAGNOSIS — Z85828 Personal history of other malignant neoplasm of skin: Secondary | ICD-10-CM | POA: Diagnosis not present

## 2019-05-23 DIAGNOSIS — B079 Viral wart, unspecified: Secondary | ICD-10-CM | POA: Diagnosis not present

## 2019-05-23 DIAGNOSIS — D485 Neoplasm of uncertain behavior of skin: Secondary | ICD-10-CM | POA: Diagnosis not present

## 2019-05-23 DIAGNOSIS — L821 Other seborrheic keratosis: Secondary | ICD-10-CM | POA: Diagnosis not present

## 2019-05-23 DIAGNOSIS — L309 Dermatitis, unspecified: Secondary | ICD-10-CM | POA: Diagnosis not present

## 2019-05-23 DIAGNOSIS — I872 Venous insufficiency (chronic) (peripheral): Secondary | ICD-10-CM | POA: Diagnosis not present

## 2019-05-23 DIAGNOSIS — D225 Melanocytic nevi of trunk: Secondary | ICD-10-CM | POA: Diagnosis not present

## 2019-05-23 DIAGNOSIS — L814 Other melanin hyperpigmentation: Secondary | ICD-10-CM | POA: Diagnosis not present

## 2019-05-23 DIAGNOSIS — L570 Actinic keratosis: Secondary | ICD-10-CM | POA: Diagnosis not present

## 2019-05-31 NOTE — Progress Notes (Signed)
[facility] Note  06/01/2019     CHIEF COMPLAINT Patient presents for Retina Evaluation   HISTORY OF PRESENT ILLNESS: Ruth Wolfe is a 75 y.o. female who presents to the clinic today for:   HPI    Retina Evaluation    In both eyes.  Onset: Unknown.  Duration: Unknown.  Associated Symptoms Photophobia, Glare and Distortion.  Context:  distance vision, mid-range vision and near vision.  Treatments tried include eye drops and artificial tears.  Response to treatment was no improvement.  I, the attending physician,  performed the HPI with the patient and updated documentation appropriately.          Comments    74 y/o female pt referred by Dr. Kathlen Mody about 1 wk ago for eval of VMT OU (OS>OD).  VA good OU cc.  Denies pain, FOL, floaters.  Latanoprost QHS OU.  TheraTears prn OU.  Feels like she sees well at distance, but noticed a "smudge" in her vision OD after she has been reading for a while.       Last edited by Bernarda Caffey, MD on 06/01/2019  9:50 AM. (History)    pt is here on the referral of Dr. Kathlen Mody for concern of VMT OU, pt states Dr. Kathlen Mody showed her pictures of a "bulge" in her retina, but didn't explain why it was happening, pt states Dr. Kathlen Mody told her she needs cataract sx, but pt also has dry eyes and glaucoma, pt is on latanoprost, pt denies eye pain or light sensitivity, pt has been told in the past she has macular degeneration, but is not taking vitamins  Referring physician: Hortencia Pilar, MD [place],  [place] [id number]  HISTORICAL INFORMATION:   Selected notes from the MEDICAL RECORD NUMBER Referred by Dr. Quentin Ore for concern of VMT OU LEE: 03.18.21 Read Drivers) [BCVA: OD: 20/30-- OS: 20/50] Ocular Hx-POAG OU, VMT OU, DES OU, cataracts OU, HTN ret OU PMH-HLD   CURRENT MEDICATIONS: Current Outpatient Medications (Ophthalmic Drugs)  Medication Sig  . latanoprost (XALATAN) 0.005 % ophthalmic solution  Place 1 drop into both eyes at bedtime.    No current facility-administered medications for this visit. (Ophthalmic Drugs)   Current Outpatient Medications (Other)  Medication Sig  . Albuterol Sulfate (PROAIR RESPICLICK) 321 (90 Base) MCG/ACT AEPB Inhale 2 puffs into the lungs 4 (four) times daily as needed.  . ALPRAZolam (XANAX) 0.5 MG tablet Take 0.5 mg by mouth at bedtime as needed for sleep.   Marland Kitchen aspirin 81 MG EC tablet Take by mouth.  . Aspirin Buf,CaCarb-MgCarb-MgO, 81 MG TABS Take by mouth.  . Cholecalciferol (VITAMIN D) 2000 UNITS CAPS Take 2,000 Units by mouth daily. Takes 4000 units daily  . fluticasone (FLONASE) 50 MCG/ACT nasal spray Place into both nostrils daily.  . Fluticasone-Salmeterol (ADVAIR) 250-50 MCG/DOSE AEPB Inhale 1 puff into the lungs at bedtime.   . Multiple Vitamin (MULTI-VITAMIN) tablet Take by mouth.  . Omega-3 1000 MG CAPS Take by mouth.  . pravastatin (PRAVACHOL) 80 MG tablet Take by mouth.  . simvastatin (ZOCOR) 40 MG tablet Take 40 mg by mouth daily.  Marland Kitchen UNABLE TO FIND Take 1 tablet by mouth 2 (two) times daily. Med Name: Macular Degeneration Supplement. 1 tablet twice daily    No current facility-administered medications for this visit. (Other)      REVIEW OF SYSTEMS: ROS    Positive for: Gastrointestinal, Neurological, Musculoskeletal, Eyes, Respiratory  Negative for: Constitutional, Skin, Genitourinary, HENT, Endocrine, Cardiovascular, Psychiatric, Allergic/Imm, Heme/Lymph   Last edited by Matthew Folks, COA on 06/01/2019  8:52 AM. (History)       ALLERGIES Allergies  Allergen Reactions  . Sulfa Antibiotics     Wheezing, swelling face and ears    PAST MEDICAL HISTORY Past Medical History:  Diagnosis Date  . Allergy   . Arthritis   . Asthma   . Cataract    Mixed form OU  . Environmental allergies   . GERD (gastroesophageal reflux disease)   . Glaucoma    POAG OU  . Hyperlipidemia   . Hypertensive retinopathy    OU  .  Neuropathy    legs and arms  . Osteopenia    Past Surgical History:  Procedure Laterality Date  . APPENDECTOMY    . TOTAL ABDOMINAL HYSTERECTOMY  1981    FAMILY HISTORY Family History  Problem Relation Age of Onset  . Lung cancer Father   . Glaucoma Father   . Colon cancer Neg Hx   . Stomach cancer Neg Hx     SOCIAL HISTORY Social History   Tobacco Use  . Smoking status: Never Smoker  . Smokeless tobacco: Never Used  Substance Use Topics  . Alcohol use: Yes    Alcohol/week: 5.0 - 6.0 standard drinks    Types: 5 - 6 Cans of beer per week  . Drug use: No         OPHTHALMIC EXAM:  Base Eye Exam    Visual Acuity (Snellen - Linear)      Right Left   Dist cc 20/50 -2 20/60 -2   Dist ph cc NI NI   Correction: Glasses       Tonometry (Tonopen, 9:11 AM)      Right Left   Pressure 15 15       Pupils      Dark Light Shape React APD   Right 3 2 Round Brisk None   Left 3 2 Round Brisk None       Visual Fields (Counting fingers)      Left Right    Full Full       Extraocular Movement      Right Left    Full, Ortho Full, Ortho       Neuro/Psych    Oriented x3: Yes   Mood/Affect: Normal       Dilation    Both eyes: 1.0% Mydriacyl, 2.5% Phenylephrine @ 9:11 AM        Slit Lamp and Fundus Exam    Slit Lamp Exam      Right Left   Lids/Lashes Mild Dermatochalasis - upper lid, mild Meibomian gland dysfunction, Telangiectasia Mild Dermatochalasis - upper lid, mild Meibomian gland dysfunction, Telangiectasia   Conjunctiva/Sclera White and quiet White and quiet   Cornea Arcus, 1+ Punctate epithelial erosions Arcus, 1-2+ Punctate epithelial erosions   Anterior Chamber Deep and quiet Deep and quiet   Iris Round and dilated Round and dilated   Lens 2-3+ Nuclear sclerosis, 2-3+ Cortical cataract, punctate pigment deposition on anterior capsule 2-3+ Nuclear sclerosis, 2-3+ Cortical cataract   Vitreous Vitreous syneresis Vitreous syneresis       Fundus Exam       Right Left   Disc Tilted disc, +cupping, temporal PPA Tilted disc, +cupping, temporal PPA   C/D Ratio 0.75 0.7   Macula Flat, Blunted foveal reflex, central Cystic changes, Drusen, RPE mottling and clumping, no heme Blunted foveal  reflex, central macular cyst, Drusen, RPE mottling and clumping, no heme   Vessels Mild Vascular attenuation, Tortuousity Mild Vascular attenuation, Tortuousity   Periphery Attached, peripheral drusen, reticular degeneration Attached, peripheral drusen, reticular degeneration        Refraction    Wearing Rx      Sphere Cylinder Axis Add   Right -4.25 +2.25 020 +2.50   Left -5.50 +3.00 155 +2.50   Age: 74 wk   Type: PAL       Manifest Refraction      Sphere Cylinder Axis Dist VA   Right -4.25 +2.75 025 20/25-2   Left -6.00 +3.00 155 20/40-2          IMAGING AND PROCEDURES  Imaging and Procedures for @TODAY @  OCT, Retina - OU - Both Eyes       Right Eye Quality was good. Central Foveal Thickness: 339. Progression has no prior data. Findings include abnormal foveal contour, intraretinal fluid, no SRF, vitreous traction, retinal drusen .   Left Eye Quality was good. Central Foveal Thickness: 382. Progression has no prior data. Findings include abnormal foveal contour, intraretinal fluid, no SRF, retinal drusen , vitreous traction.   Notes *Images captured and stored on drive  Diagnosis / Impression:  VMT with central cystic changes OU (OS > OD) Non-exu ARMD OU  Clinical management:  See below  Abbreviations: NFP - Normal foveal profile. CME - cystoid macular edema. PED - pigment epithelial detachment. IRF - intraretinal fluid. SRF - subretinal fluid. EZ - ellipsoid zone. ERM - epiretinal membrane. ORA - outer retinal atrophy. ORT - outer retinal tubulation. SRHM - subretinal hyper-reflective material                 ASSESSMENT/PLAN:    ICD-10-CM   1. Vitreomacular adhesion of both eyes  H43.823   2. Retinal edema  H35.81 OCT,  Retina - OU - Both Eyes  3. Intermediate stage nonexudative age-related macular degeneration of both eyes  H35.3132   4. Essential hypertension  I10   5. Hypertensive retinopathy of both eyes  H35.033   6. Combined forms of age-related cataract of both eyes  H25.813   7. Primary open angle glaucoma of both eyes, unspecified glaucoma stage  H40.1130   8. Dry eyes  H04.123     1,2. VMT with central cystic changes OU (OS>OD)  - BCVA 20/25 OD, 20/40 OS by MRx  - asymptomatic, no metamorphopsia  - The natural history, anatomy, potential for loss of vision, and treatment options including vitrectomy techniques and the complications of endophthalmitis, retinal detachment, vitreous hemorrhage, cataract progression and permanent vision loss discussed with the patient.  - discussed possibility of spontaneous release of VMT vs progression to macular hole  - no indication for surgery at this time and pt wishes to monitor for now -- reasonable  - f/u 6 weeks for repeat DFE/OCT  3. Age related macular degeneration, non-exudative, both eyes  - The incidence, anatomy, and pathology of dry AMD, risk of progression, and the AREDS and AREDS 2 study including smoking risks discussed with patient.  - Recommend amsler grid monitoring  4,5. Hypertensive retinopathy OU  - discussed importance of tight BP control  - monitor  6. Mixed form age related cataracts OU  - The symptoms of cataract, surgical options, and treatments and risks were discussed with patient.  - discussed diagnosis and progression  - clear from a retina standpoint to proceed with cataract surgery when pt and surgeon are ready  -  pt reports she lives alone, has significant glare symptoms and is unable to drive at night  - recommend cataract surgery OU for improvement of ADLs, may promote the release of VMT, and would be helpful in the case that she does need PPV in the future.  7. POAG OU  - under the expert management of Dr. Kathlen Mody  -  IOP today: 15 OU  - on latanoprost QHS OU  8. Dry eyes OU  - recommend artificial tears and lubricating ointment as needed       Ophthalmic Meds Ordered this visit:  No orders of the defined types were placed in this encounter.      Return in about 6 weeks (around 07/13/2019) for f/u VMT OU, DFE, OCT.  There are no Patient Instructions on file for this visit.   Explained the diagnoses, plan, and follow up with the patient and they expressed understanding.  Patient expressed understanding of the importance of proper follow up care.   This document serves as a record of services personally performed by Gardiner Sleeper, MD, PhD. It was created on their behalf by Ernest Mallick, OA, an ophthalmic assistant. The creation of this record is the provider's dictation and/or activities during the visit.    Electronically signed by: Ernest Mallick, OA 04.01.2021 12:13 PM   Gardiner Sleeper, M.D., Ph.D. Diseases & Surgery of the Retina and Vitreous [facility]  I have reviewed the above documentation for accuracy and completeness, and I agree with the above. Gardiner Sleeper, M.D., Ph.D. 06/01/19 12:13 PM   Abbreviations: M myopia (nearsighted); A astigmatism; H hyperopia (farsighted); P presbyopia; Mrx spectacle prescription;  CTL contact lenses; OD right eye; OS left eye; OU both eyes  XT exotropia; ET esotropia; PEK punctate epithelial keratitis; PEE punctate epithelial erosions; DES dry eye syndrome; MGD meibomian gland dysfunction; ATs artificial tears; PFAT's preservative free artificial tears; [place] nuclear sclerotic cataract; PSC posterior subcapsular cataract; ERM epi-retinal membrane; PVD posterior vitreous detachment; RD retinal detachment; DM diabetes mellitus; DR diabetic retinopathy; NPDR non-proliferative diabetic retinopathy; PDR proliferative diabetic retinopathy; CSME clinically significant macular edema; DME diabetic macular edema; dbh dot blot hemorrhages;  CWS cotton wool spot; POAG primary open angle glaucoma; C/D cup-to-disc ratio; HVF humphrey visual field; GVF goldmann visual field; OCT optical coherence tomography; IOP intraocular pressure; BRVO Branch retinal vein occlusion; CRVO central retinal vein occlusion; CRAO central retinal artery occlusion; BRAO branch retinal artery occlusion; RT retinal tear; SB scleral buckle; PPV pars plana vitrectomy; VH Vitreous hemorrhage; PRP panretinal laser photocoagulation; IVK intravitreal kenalog; VMT vitreomacular traction; MH Macular hole;  NVD neovascularization of the disc; NVE neovascularization elsewhere; AREDS age related eye disease study; ARMD age related macular degeneration; POAG primary open angle glaucoma; EBMD epithelial/anterior basement membrane dystrophy; ACIOL anterior chamber intraocular lens; IOL intraocular lens; PCIOL posterior chamber intraocular lens; Phaco/IOL phacoemulsification with intraocular lens placement; [place] photorefractive keratectomy; LASIK laser assisted in situ keratomileusis; HTN hypertension; DM diabetes mellitus; COPD chronic obstructive pulmonary disease

## 2019-06-01 ENCOUNTER — Encounter (INDEPENDENT_AMBULATORY_CARE_PROVIDER_SITE_OTHER): Payer: Self-pay | Admitting: Ophthalmology

## 2019-06-01 ENCOUNTER — Ambulatory Visit (INDEPENDENT_AMBULATORY_CARE_PROVIDER_SITE_OTHER): Payer: Medicare Other | Admitting: Ophthalmology

## 2019-06-01 DIAGNOSIS — I10 Essential (primary) hypertension: Secondary | ICD-10-CM | POA: Diagnosis not present

## 2019-06-01 DIAGNOSIS — H401130 Primary open-angle glaucoma, bilateral, stage unspecified: Secondary | ICD-10-CM

## 2019-06-01 DIAGNOSIS — H3581 Retinal edema: Secondary | ICD-10-CM

## 2019-06-01 DIAGNOSIS — H353132 Nonexudative age-related macular degeneration, bilateral, intermediate dry stage: Secondary | ICD-10-CM

## 2019-06-01 DIAGNOSIS — H43823 Vitreomacular adhesion, bilateral: Secondary | ICD-10-CM | POA: Diagnosis not present

## 2019-06-01 DIAGNOSIS — H35033 Hypertensive retinopathy, bilateral: Secondary | ICD-10-CM | POA: Diagnosis not present

## 2019-06-01 DIAGNOSIS — H04123 Dry eye syndrome of bilateral lacrimal glands: Secondary | ICD-10-CM | POA: Diagnosis not present

## 2019-06-01 DIAGNOSIS — H25813 Combined forms of age-related cataract, bilateral: Secondary | ICD-10-CM | POA: Diagnosis not present

## 2019-06-04 DIAGNOSIS — R7309 Other abnormal glucose: Secondary | ICD-10-CM | POA: Diagnosis not present

## 2019-06-04 DIAGNOSIS — M25551 Pain in right hip: Secondary | ICD-10-CM | POA: Diagnosis not present

## 2019-06-04 DIAGNOSIS — G629 Polyneuropathy, unspecified: Secondary | ICD-10-CM | POA: Diagnosis not present

## 2019-06-04 DIAGNOSIS — M1611 Unilateral primary osteoarthritis, right hip: Secondary | ICD-10-CM | POA: Diagnosis not present

## 2019-06-04 DIAGNOSIS — J4520 Mild intermittent asthma, uncomplicated: Secondary | ICD-10-CM | POA: Diagnosis not present

## 2019-06-04 DIAGNOSIS — Z0000 Encounter for general adult medical examination without abnormal findings: Secondary | ICD-10-CM | POA: Diagnosis not present

## 2019-06-04 DIAGNOSIS — J3801 Paralysis of vocal cords and larynx, unilateral: Secondary | ICD-10-CM | POA: Diagnosis not present

## 2019-06-04 DIAGNOSIS — E559 Vitamin D deficiency, unspecified: Secondary | ICD-10-CM | POA: Diagnosis not present

## 2019-06-04 DIAGNOSIS — M79604 Pain in right leg: Secondary | ICD-10-CM | POA: Diagnosis not present

## 2019-06-04 DIAGNOSIS — M8580 Other specified disorders of bone density and structure, unspecified site: Secondary | ICD-10-CM | POA: Diagnosis not present

## 2019-06-08 DIAGNOSIS — M7061 Trochanteric bursitis, right hip: Secondary | ICD-10-CM | POA: Diagnosis not present

## 2019-06-08 DIAGNOSIS — M25551 Pain in right hip: Secondary | ICD-10-CM | POA: Insufficient documentation

## 2019-07-10 NOTE — Progress Notes (Signed)
[facility] Note  07/13/2019     CHIEF COMPLAINT Patient presents for Retina Follow Up   HISTORY OF PRESENT ILLNESS: Ruth Wolfe is a 74 y.o. female who presents to the clinic today for:   HPI    Retina Follow Up    Patient presents with  Other.  In both eyes.  This started 6 weeks ago.  Severity is moderate.  Duration of 6 weeks.  Since onset it is stable.  I, the attending physician,  performed the HPI with the patient and updated documentation appropriately.          Comments    74 y/o female pt here for 6 wk f/u for VMT OU.  No change in [place] OU.  Denies pain, FOL, floaters.  Latanoprost QHS OU.       Last edited by Bernarda Caffey, MD on 07/13/2019  2:26 PM. (History)    pt states she is taking Preservision vits now   Referring physician: Janie Morning, DO [address] [place],  [place] [address]  HISTORICAL INFORMATION:   Selected notes from the MEDICAL RECORD NUMBER Referred by Dr. Quentin Ore for concern of VMT OU LEE: 03.18.21 Read Drivers) [BCVA: OD: 20/30-- OS: 20/50] Ocular Hx-POAG OU, VMT OU, DES OU, cataracts OU, HTN ret OU PMH-HLD   CURRENT MEDICATIONS: Current Outpatient Medications (Ophthalmic Drugs)  Medication Sig  . latanoprost (XALATAN) 0.005 % ophthalmic solution Place 1 drop into both eyes at bedtime.    No current facility-administered medications for this visit. (Ophthalmic Drugs)   Current Outpatient Medications (Other)  Medication Sig  . Albuterol Sulfate (PROAIR RESPICLICK) 030 (90 Base) MCG/ACT AEPB Inhale 2 puffs into the lungs 4 (four) times daily as needed.  . ALPRAZolam (XANAX) 0.5 MG tablet Take 0.5 mg by mouth at bedtime as needed for sleep.   Marland Kitchen aspirin 81 MG EC tablet Take by mouth.  . Aspirin Buf,CaCarb-MgCarb-MgO, 81 MG TABS Take by mouth.  . Cholecalciferol (VITAMIN D) 2000 UNITS CAPS Take 2,000 Units by mouth daily. Takes 4000 units daily  . fluticasone (FLONASE) 50 MCG/ACT nasal spray  Place into both nostrils daily.  . Fluticasone-Salmeterol (ADVAIR) 250-50 MCG/DOSE AEPB Inhale 1 puff into the lungs at bedtime.   . Multiple Vitamin (MULTI-VITAMIN) tablet Take by mouth.  . Omega-3 1000 MG CAPS Take by mouth.  . pravastatin (PRAVACHOL) 80 MG tablet Take by mouth.  . simvastatin (ZOCOR) 40 MG tablet Take 40 mg by mouth daily.  Marland Kitchen UNABLE TO FIND Take 1 tablet by mouth 2 (two) times daily. Med Name: Macular Degeneration Supplement. 1 tablet twice daily    No current facility-administered medications for this visit. (Other)      REVIEW OF SYSTEMS: ROS    Positive for: Gastrointestinal, Neurological, Musculoskeletal, Eyes, Respiratory   Negative for: Constitutional, Skin, Genitourinary, HENT, Endocrine, Cardiovascular, Psychiatric, Allergic/Imm, Heme/Lymph   Last edited by Matthew Folks, COA on 07/13/2019  1:40 PM. (History)       ALLERGIES Allergies  Allergen Reactions  . Sulfa Antibiotics     Wheezing, swelling face and ears    PAST MEDICAL HISTORY Past Medical History:  Diagnosis Date  . Allergy   . Arthritis   . Asthma   . Cataract    Mixed form OU  . Environmental allergies   . GERD (gastroesophageal reflux disease)   . Glaucoma    POAG OU  . Hyperlipidemia   . Hypertensive retinopathy  OU  . Neuropathy    legs and arms  . Osteopenia    Past Surgical History:  Procedure Laterality Date  . APPENDECTOMY    . TOTAL ABDOMINAL HYSTERECTOMY  1981    FAMILY HISTORY Family History  Problem Relation Age of Onset  . Lung cancer Father   . Glaucoma Father   . Colon cancer Neg Hx   . Stomach cancer Neg Hx     SOCIAL HISTORY Social History   Tobacco Use  . Smoking status: Never Smoker  . Smokeless tobacco: Never Used  Substance Use Topics  . Alcohol use: Yes    Alcohol/week: 5.0 - 6.0 standard drinks    Types: 5 - 6 Cans of beer per week  . Drug use: No         OPHTHALMIC EXAM:  Base Eye Exam    Visual Acuity (Snellen - Linear)       Right Left   Dist cc 20/40 - 20/50 -   Dist ph cc NI NI   Correction: Glasses       Tonometry (Tonopen, 1:42 PM)      Right Left   Pressure 13 14       Pupils      Dark Light Shape React APD   Right 3 2 Round Brisk None   Left 3 2 Round Brisk None       Visual Fields (Counting fingers)      Left Right    Full Full       Extraocular Movement      Right Left    Full, Ortho Full, Ortho       Neuro/Psych    Oriented x3: Yes   Mood/Affect: Normal       Dilation    Both eyes: 1.0% Mydriacyl, 2.5% Phenylephrine @ 1:42 PM        Slit Lamp and Fundus Exam    Slit Lamp Exam      Right Left   Lids/Lashes Mild Dermatochalasis - upper lid, mild Meibomian gland dysfunction, Telangiectasia Mild Dermatochalasis - upper lid, mild Meibomian gland dysfunction, Telangiectasia   Conjunctiva/Sclera White and quiet White and quiet   Cornea Arcus, 1+ Punctate epithelial erosions Arcus, 1+ Punctate epithelial erosions   Anterior Chamber Deep and quiet Deep and quiet   Iris Round and dilated Round and dilated   Lens 2-3+ Nuclear sclerosis, 2-3+ Cortical cataract, punctate pigment deposition on anterior capsule 2-3+ Nuclear sclerosis, 2-3+ Cortical cataract, linear pigmentation temporal   Vitreous Vitreous syneresis Vitreous syneresis       Fundus Exam      Right Left   Disc Tilted disc, +cupping, temporal PPA, Sharp rim Tilted disc, +cupping, temporal PPA   C/D Ratio 0.75 0.7   Macula Flat, Blunted foveal reflex, central Cystic changes, Drusen, RPE mottling and clumping, no heme Flat, Blunted foveal reflex, central macular cyst, Drusen, RPE mottling and clumping, no heme   Vessels Mild Vascular attenuation, Tortuousity Mild Vascular attenuation, Tortuousity   Periphery Attached, peripheral drusen, reticular degeneration Attached, peripheral drusen, reticular degeneration          IMAGING AND PROCEDURES  Imaging and Procedures for @TODAY@  OCT, Retina - OU - Both Eyes        Right Eye Quality was good. Central Foveal Thickness: 247. Progression has worsened. 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   Left Eye Quality was good. Central Foveal Thickness: 389. Progression has been stable. Findings include abnormal foveal contour, intraretinal fluid, no SRF, retinal drusen , vitreous traction (Stable VMT with persistent cystic changes).   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 CME - cystoid macular edema. PED - pigment epithelial detachment. IRF - intraretinal fluid. SRF - subretinal fluid. EZ - ellipsoid zone. ERM - epiretinal membrane. ORA - outer retinal atrophy. ORT - outer retinal tubulation. SRHM - subretinal hyper-reflective material                 ASSESSMENT/PLAN:    ICD-10-CM   1. Vitreomacular adhesion of both eyes  H43.823   2. Retinal edema  H35.81 OCT, Retina - OU - Both Eyes  3. Intermediate stage nonexudative age-related macular degeneration of both eyes  H35.3132   4. Essential hypertension  I10   5. Hypertensive retinopathy of both eyes  H35.033   6. Combined forms of age-related cataract of both eyes  H25.813   7. Primary open angle glaucoma of both eyes, unspecified glaucoma stage  H40.1130   8. Dry eyes  H04.123     1,2. VMT with central cystic changes OU (OS>OD)  - BCVA 20/40 OD, 20/50  - asymptomatic, no metamorphopsia  - OD: Mild interval increase in cystic changes; inner retinal schisis temporal macula caused  by VMT caught on widefield  - OS: Stable VMT with persistent cystic changes  - discussed possibility of spontaneous release of VMT vs progression to macular hole  - no indication for surgery at this time and pt wishes to monitor for now -- reasonable  - f/u 8-10 weeks for repeat DFE/OCT  3. Age related macular degeneration, non-exudative, both eyes  - The incidence, anatomy, and pathology of dry AMD, risk of progression, and the AREDS and AREDS 2 study including smoking risks discussed with patient.  - Recommend amsler grid monitoring  4,5. Hypertensive retinopathy OU  - discussed importance of tight BP control  - monitor  6. Mixed form age related cataracts OU  - The symptoms of cataract, surgical options, and treatments and risks were discussed with patient.  - discussed diagnosis and progression  - clear from a retina standpoint to proceed with cataract surgery when pt and surgeon are ready  - pt reports she lives alone, has significant glare symptoms and is unable to drive at night  - recommend cataract surgery OU for improvement of ADLs, may promote the release of VMT, and would be helpful in the case that she does need PPV in the future.  7. POAG OU  - under the expert management of Dr. Weaver  - IOP today: 13,14  - on latanoprost QHS OU  8. Dry eyes OU  - recommend artificial tears and lubricating ointment as needed   Ophthalmic Meds Ordered this visit:  No orders of the defined types were placed in this encounter.     Return for f/u 8-10 weeks, VMT OU, DFE, OCT.  There are no Patient Instructions on file for this visit.   Explained the diagnoses, plan, and follow up with the patient and they expressed understanding.  Patient   expressed understanding of the importance of proper follow up care.   This document serves as a record of services personally performed by Brian G. Zamora, MD, PhD. It was created on their behalf by Amanda Brown, OA, an ophthalmic assistant. The  creation of this record is the provider's dictation and/or activities during the visit.    Electronically signed by: Amanda Brown, OA 05.11.2021 11:20 PM   Brian G. Zamora, M.D., Ph.D. Diseases & Surgery of the Retina and Vitreous [facility]  I have reviewed the above documentation for accuracy and completeness, and I agree with the above. Brian G. Zamora, M.D., Ph.D. 07/15/19 11:20 PM    Abbreviations: M myopia (nearsighted); A astigmatism; H hyperopia (farsighted); P presbyopia; Mrx spectacle prescription;  CTL contact lenses; OD right eye; OS left eye; OU both eyes  XT exotropia; ET esotropia; PEK punctate epithelial keratitis; PEE punctate epithelial erosions; DES dry eye syndrome; MGD meibomian gland dysfunction; ATs artificial tears; PFAT's preservative free artificial tears; NSC nuclear sclerotic cataract; PSC posterior subcapsular cataract; ERM epi-retinal membrane; PVD posterior vitreous detachment; RD retinal detachment; DM diabetes mellitus; DR diabetic retinopathy; NPDR non-proliferative diabetic retinopathy; PDR proliferative diabetic retinopathy; CSME clinically significant macular edema; DME diabetic macular edema; dbh dot blot hemorrhages; CWS cotton wool spot; POAG primary open angle glaucoma; C/D cup-to-disc ratio; HVF humphrey visual field; GVF goldmann visual field; OCT optical coherence tomography; IOP intraocular pressure; BRVO Branch retinal vein occlusion; CRVO central retinal vein occlusion; CRAO central retinal artery occlusion; BRAO branch retinal artery occlusion; RT retinal tear; SB scleral buckle; PPV pars plana vitrectomy; VH Vitreous hemorrhage; PRP panretinal laser photocoagulation; IVK intravitreal kenalog; VMT vitreomacular traction; MH Macular hole;  NVD neovascularization of the disc; NVE neovascularization elsewhere; AREDS age related eye disease study; ARMD age related macular degeneration; POAG primary open angle glaucoma; EBMD  epithelial/anterior basement membrane dystrophy; ACIOL anterior chamber intraocular lens; IOL intraocular lens; PCIOL posterior chamber intraocular lens; Phaco/IOL phacoemulsification with intraocular lens placement; PRK photorefractive keratectomy; LASIK laser assisted in situ keratomileusis; HTN hypertension; DM diabetes mellitus; COPD chronic obstructive pulmonary disease  

## 2019-07-13 ENCOUNTER — Other Ambulatory Visit: Payer: Self-pay

## 2019-07-13 ENCOUNTER — Encounter (INDEPENDENT_AMBULATORY_CARE_PROVIDER_SITE_OTHER): Payer: Self-pay | Admitting: Ophthalmology

## 2019-07-13 ENCOUNTER — Ambulatory Visit (INDEPENDENT_AMBULATORY_CARE_PROVIDER_SITE_OTHER): Payer: Medicare Other | Admitting: Ophthalmology

## 2019-07-13 DIAGNOSIS — H401130 Primary open-angle glaucoma, bilateral, stage unspecified: Secondary | ICD-10-CM

## 2019-07-13 DIAGNOSIS — I10 Essential (primary) hypertension: Secondary | ICD-10-CM | POA: Diagnosis not present

## 2019-07-13 DIAGNOSIS — H04123 Dry eye syndrome of bilateral lacrimal glands: Secondary | ICD-10-CM | POA: Diagnosis not present

## 2019-07-13 DIAGNOSIS — H353132 Nonexudative age-related macular degeneration, bilateral, intermediate dry stage: Secondary | ICD-10-CM | POA: Diagnosis not present

## 2019-07-13 DIAGNOSIS — H25813 Combined forms of age-related cataract, bilateral: Secondary | ICD-10-CM

## 2019-07-13 DIAGNOSIS — H43823 Vitreomacular adhesion, bilateral: Secondary | ICD-10-CM | POA: Diagnosis not present

## 2019-07-13 DIAGNOSIS — H35033 Hypertensive retinopathy, bilateral: Secondary | ICD-10-CM | POA: Diagnosis not present

## 2019-07-13 DIAGNOSIS — H3581 Retinal edema: Secondary | ICD-10-CM

## 2019-07-20 DIAGNOSIS — M25551 Pain in right hip: Secondary | ICD-10-CM | POA: Diagnosis not present

## 2019-07-20 DIAGNOSIS — M7061 Trochanteric bursitis, right hip: Secondary | ICD-10-CM | POA: Diagnosis not present

## 2019-10-01 ENCOUNTER — Encounter (INDEPENDENT_AMBULATORY_CARE_PROVIDER_SITE_OTHER): Payer: Medicare Other | Admitting: Ophthalmology

## 2019-10-12 DIAGNOSIS — G8929 Other chronic pain: Secondary | ICD-10-CM | POA: Diagnosis not present

## 2019-10-12 DIAGNOSIS — M4722 Other spondylosis with radiculopathy, cervical region: Secondary | ICD-10-CM | POA: Diagnosis not present

## 2019-10-12 DIAGNOSIS — M25512 Pain in left shoulder: Secondary | ICD-10-CM | POA: Diagnosis not present

## 2019-10-15 NOTE — Progress Notes (Signed)
[facility] Note  10/22/2019     CHIEF COMPLAINT Patient presents for Retina Follow Up   HISTORY OF PRESENT ILLNESS: Ruth Wolfe is a 74 y.o. female who presents to the clinic today for:   HPI    Retina Follow Up    Patient presents with  Other.  Duration of 3 months.  Since onset it is stable.  I, the attending physician,  performed the HPI with the patient and updated documentation appropriately.          Comments    3 month follow up [address] OU- Patient is having cataract sx 10/31/19 OS and 11/14/19 OD.  Vision appears stable since last visit.  Using Latanprost OU qhs.        Last edited by Bernarda Caffey, MD on 10/23/2019 12:44 PM. (History)    pt saw Dr. Kathlen Mody on 08.17 for cataract consult, pt states OS is scheduled for 09.01 and OD 2 weeks after that, she states her vision is the same   Referring physician: Hortencia Pilar, MD [place],  [place] [address]  HISTORICAL INFORMATION:   Selected notes from the MEDICAL RECORD NUMBER Referred by Dr. Quentin Ore for concern of VMT OU LEE: 03.18.21 Read Drivers) [BCVA: OD: 20/30-- OS: 20/50] Ocular Hx-POAG OU, VMT OU, DES OU, cataracts OU, HTN ret OU PMH-HLD   CURRENT MEDICATIONS: Current Outpatient Medications (Ophthalmic Drugs)  Medication Sig  . latanoprost (XALATAN) 0.005 % ophthalmic solution Place 1 drop into both eyes at bedtime.    No current facility-administered medications for this visit. (Ophthalmic Drugs)   Current Outpatient Medications (Other)  Medication Sig  . Fluticasone-Salmeterol (ADVAIR) 250-50 MCG/DOSE AEPB Inhale 1 puff into the lungs at bedtime.   . Melatonin 10 MG CAPS Take by mouth.  . Multiple Vitamins-Minerals (PRESERVISION AREDS 2 PO) Take by mouth.  . Albuterol Sulfate (PROAIR RESPICLICK) 177 (90 Base) MCG/ACT AEPB Inhale 2 puffs into the lungs 4 (four) times daily as needed. (Patient not taking: Reported on 10/22/2019)  . ALPRAZolam (XANAX)  0.5 MG tablet Take 0.5 mg by mouth at bedtime as needed for sleep.  (Patient not taking: Reported on 10/22/2019)  . aspirin 81 MG EC tablet Take by mouth. (Patient not taking: Reported on 10/22/2019)  . Aspirin Buf,CaCarb-MgCarb-MgO, 81 MG TABS Take by mouth. (Patient not taking: Reported on 10/22/2019)  . Cholecalciferol (VITAMIN D) 2000 UNITS CAPS Take 2,000 Units by mouth daily. Takes 4000 units daily  . fluticasone (FLONASE) 50 MCG/ACT nasal spray Place into both nostrils daily. (Patient not taking: Reported on 10/22/2019)  . Multiple Vitamin (MULTI-VITAMIN) tablet Take by mouth. (Patient not taking: Reported on 10/22/2019)  . Omega-3 1000 MG CAPS Take by mouth. (Patient not taking: Reported on 10/22/2019)  . pravastatin (PRAVACHOL) 80 MG tablet Take by mouth. (Patient not taking: Reported on 10/22/2019)  . simvastatin (ZOCOR) 40 MG tablet Take 40 mg by mouth daily. (Patient not taking: Reported on 10/22/2019)  . UNABLE TO FIND Take 1 tablet by mouth 2 (two) times daily. Med Name: Macular Degeneration Supplement. 1 tablet twice daily  (Patient not taking: Reported on 10/22/2019)   No current facility-administered medications for this visit. (Other)      REVIEW OF SYSTEMS: ROS    Positive for: Gastrointestinal, Neurological, Musculoskeletal, Eyes, Respiratory   Negative for: Constitutional, Skin, Genitourinary, HENT, Endocrine, Cardiovascular, Psychiatric, Allergic/Imm, Heme/Lymph   Last edited by Leonie Douglas, COA on 10/22/2019  1:57 PM. (History)  ALLERGIES Allergies  Allergen Reactions  . Sulfa Antibiotics     Wheezing, swelling face and ears    PAST MEDICAL HISTORY Past Medical History:  Diagnosis Date  . Allergy   . Arthritis   . Asthma   . Cataract    Mixed form OU  . Environmental allergies   . GERD (gastroesophageal reflux disease)   . Glaucoma    POAG OU  . Hyperlipidemia   . Hypertensive retinopathy    OU  . Neuropathy    legs and arms  . Osteopenia    Past  Surgical History:  Procedure Laterality Date  . APPENDECTOMY    . TOTAL ABDOMINAL HYSTERECTOMY  1981    FAMILY HISTORY Family History  Problem Relation Age of Onset  . Lung cancer Father   . Glaucoma Father   . Colon cancer Neg Hx   . Stomach cancer Neg Hx     SOCIAL HISTORY Social History   Tobacco Use  . Smoking status: Never Smoker  . Smokeless tobacco: Never Used  Substance Use Topics  . Alcohol use: Yes    Alcohol/week: 5.0 - 6.0 standard drinks    Types: 5 - 6 Cans of beer per week  . Drug use: No         OPHTHALMIC EXAM:  Base Eye Exam    Visual Acuity (Snellen - Linear)      Right Left   Dist cc 20/40 20/50-   Dist ph cc NI NI   Correction: Glasses       Tonometry (Tonopen, 2:04 PM)      Right Left   Pressure 14 12       Pupils      Dark Light Shape React APD   Right 3 2 Round Brisk None   Left 3 2 Round Brisk None       Visual Fields (Counting fingers)      Left Right    Full Full       Extraocular Movement      Right Left    Full Full       Neuro/Psych    Oriented x3: Yes   Mood/Affect: Normal       Dilation    Both eyes: 1.0% Mydriacyl, 2.5% Phenylephrine @ 2:04 PM        Slit Lamp and Fundus Exam    Slit Lamp Exam      Right Left   Lids/Lashes Mild Dermatochalasis - upper lid, Telangiectasia Mild Dermatochalasis - upper lid, Telangiectasia   Conjunctiva/Sclera White and quiet White and quiet   Cornea Arcus, 2+ inferior Punctate epithelial erosions Arcus, 1-2+ inferior Punctate epithelial erosions   Anterior Chamber deep and clear, narrow temporal angle deep and clear, narrow temporal angle   Iris Round and dilated Round and dilated   Lens 2-3+ Nuclear sclerosis, 2-3+ Cortical cataract, punctate pigment deposition on anterior capsule 2-3+ Nuclear sclerosis, 3+ Cortical cataract, linear pigmentation temporal   Vitreous Vitreous syneresis Vitreous syneresis       Fundus Exam      Right Left   Disc Tilted disc, +cupping,  temporal PPA, Sharp rim Tilted disc, +cupping, temporal PPA   C/D Ratio 0.7 0.7   Macula Flat, Blunted foveal reflex, central Cystic changes, Drusen, RPE mottling and clumping, no heme Flat, Blunted foveal reflex, central macular cyst, Drusen, RPE mottling and clumping, no heme   Vessels Mild Vascular attenuation, mild Tortuousity, mild AV crossing changes Mild Vascular attenuation, mild Tortuousity  Periphery Attached, peripheral drusen, reticular degeneration Attached, peripheral drusen, reticular degeneration          IMAGING AND PROCEDURES  Imaging and Procedures for @TODAY @  OCT, Retina - OU - Both Eyes       Right Eye Quality was good. Central Foveal Thickness: 334. Progression has improved. 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   Left Eye Quality was good. Central Foveal Thickness: 382. Progression has been stable. Findings include abnormal foveal contour, intraretinal fluid, no SRF, retinal drusen , vitreous traction (Stable VMT with persistent cystic changes).   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 CME - cystoid macular edema. PED - pigment epithelial detachment. IRF - intraretinal fluid. SRF - subretinal fluid. EZ - ellipsoid zone. ERM - epiretinal membrane. ORA - outer retinal atrophy. ORT - outer retinal tubulation. SRHM - subretinal hyper-reflective material                 ASSESSMENT/PLAN:    ICD-10-CM   1. Vitreomacular  adhesion of both eyes  H43.823   2. Retinal edema  H35.81 OCT, Retina - OU - Both Eyes  3. Intermediate stage nonexudative age-related macular degeneration of both eyes  H35.3132   4. Essential hypertension  I10   5. Hypertensive retinopathy of both eyes  H35.033   6. Combined forms of age-related cataract of both eyes  H25.813   7. Primary open angle glaucoma of both eyes, unspecified glaucoma stage  H40.1130   8. Dry eyes  H04.123     1,2. 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 Age related macular degeneration, non-exudative, both eyes  - The incidence, anatomy, and pathology of dry AMD, risk of progression, and the AREDS and AREDS 2 study including smoking risks discussed with patient.  - Recommend amsler grid monitoring  4,5. Hypertensive retinopathy OU  - discussed importance of tight BP control  - monitor  6. Mixed form age related cataracts OU  - The symptoms of cataract, surgical options, and treatments and risks were discussed with patient.  - discussed diagnosis and progression  - pt reports she lives alone, has significant glare symptoms and is unable to drive at night  - recommend cataract surgery OU for improvement of ADLs, may promote the release of VMT, and would be helpful in the case that she does need PPV in the future.  - under the expert care of Dr. Kathlen Mody --    - clear from a retina standpoint to proceed with cataract surgery when pt and surgeon are ready  -  cataract surgery scheduled OU w/ Dr. Kathlen Mody -- first eye  9.1.21  7. POAG OU  - under the expert management of Dr. Kathlen Mody  - IOP today: 14,12  - on latanoprost QHS OU  8. Dry eyes OU  - recommend artificial tears and lubricating ointment as needed   Ophthalmic Meds Ordered this visit:  No orders of the defined types were placed in this encounter.     Return for f/u 5-6 weeks, VMT OU, DFE, OCT.  There are no Patient Instructions on file for this visit.  This document serves as a record of services personally performed by Gardiner Sleeper, MD, PhD. It was created on their behalf by Leeann Must, [place], an ophthalmic technician. The creation of this record is the provider's dictation and/or activities during the visit.    Electronically signed by: Leeann Must, COA @TODAY @ 12:47 PM  Gardiner Sleeper, M.D., Ph.D. Diseases & Surgery of the Retina and [place] 10/22/2019   I have reviewed the above documentation for accuracy and completeness, and I agree with the above. Gardiner Sleeper, M.D., Ph.D. 10/23/19 12:47 PM   Abbreviations: M myopia (nearsighted); A astigmatism; H hyperopia (farsighted); P presbyopia; Mrx spectacle prescription;  CTL contact lenses; OD right eye; OS left eye; OU both eyes  XT exotropia; ET esotropia; PEK punctate epithelial keratitis; PEE punctate epithelial erosions; DES dry eye syndrome; MGD meibomian gland dysfunction; ATs artificial tears; PFAT's preservative free artificial tears; [place] nuclear sclerotic cataract; PSC posterior subcapsular cataract; ERM epi-retinal membrane; PVD posterior vitreous detachment; RD retinal detachment; DM diabetes mellitus; DR diabetic retinopathy; NPDR non-proliferative diabetic retinopathy; PDR proliferative diabetic retinopathy; CSME clinically significant macular edema; DME diabetic macular edema; dbh dot blot hemorrhages; CWS cotton wool spot; POAG primary open angle glaucoma; C/D cup-to-disc ratio; HVF humphrey visual field; GVF goldmann visual  field; OCT optical coherence tomography; IOP intraocular pressure; BRVO Branch retinal vein occlusion; CRVO central retinal vein occlusion; CRAO central retinal artery occlusion; BRAO branch retinal artery occlusion; RT retinal tear; SB scleral buckle; PPV pars plana vitrectomy; VH Vitreous hemorrhage; PRP panretinal laser photocoagulation; IVK intravitreal kenalog; VMT vitreomacular traction; MH Macular hole;  NVD neovascularization of the disc; NVE neovascularization elsewhere; AREDS age related eye disease study; ARMD age related macular degeneration; POAG primary open angle glaucoma; EBMD epithelial/anterior basement membrane dystrophy; ACIOL anterior chamber intraocular lens; IOL intraocular lens; PCIOL posterior chamber intraocular lens; Phaco/IOL phacoemulsification with intraocular lens placement; [place] photorefractive keratectomy; LASIK laser assisted in situ keratomileusis; HTN hypertension; DM diabetes mellitus; COPD chronic obstructive pulmonary disease

## 2019-10-16 DIAGNOSIS — H2512 Age-related nuclear cataract, left eye: Secondary | ICD-10-CM | POA: Diagnosis not present

## 2019-10-16 DIAGNOSIS — H2513 Age-related nuclear cataract, bilateral: Secondary | ICD-10-CM | POA: Diagnosis not present

## 2019-10-16 DIAGNOSIS — H25013 Cortical age-related cataract, bilateral: Secondary | ICD-10-CM | POA: Diagnosis not present

## 2019-10-16 DIAGNOSIS — H401122 Primary open-angle glaucoma, left eye, moderate stage: Secondary | ICD-10-CM | POA: Diagnosis not present

## 2019-10-16 DIAGNOSIS — H401111 Primary open-angle glaucoma, right eye, mild stage: Secondary | ICD-10-CM | POA: Diagnosis not present

## 2019-10-22 ENCOUNTER — Ambulatory Visit (INDEPENDENT_AMBULATORY_CARE_PROVIDER_SITE_OTHER): Payer: Medicare Other | Admitting: Ophthalmology

## 2019-10-22 ENCOUNTER — Other Ambulatory Visit: Payer: Self-pay

## 2019-10-22 DIAGNOSIS — H04123 Dry eye syndrome of bilateral lacrimal glands: Secondary | ICD-10-CM | POA: Diagnosis not present

## 2019-10-22 DIAGNOSIS — H3581 Retinal edema: Secondary | ICD-10-CM

## 2019-10-22 DIAGNOSIS — H25813 Combined forms of age-related cataract, bilateral: Secondary | ICD-10-CM

## 2019-10-22 DIAGNOSIS — H35033 Hypertensive retinopathy, bilateral: Secondary | ICD-10-CM | POA: Diagnosis not present

## 2019-10-22 DIAGNOSIS — I10 Essential (primary) hypertension: Secondary | ICD-10-CM | POA: Diagnosis not present

## 2019-10-22 DIAGNOSIS — H401130 Primary open-angle glaucoma, bilateral, stage unspecified: Secondary | ICD-10-CM

## 2019-10-22 DIAGNOSIS — H43823 Vitreomacular adhesion, bilateral: Secondary | ICD-10-CM

## 2019-10-22 DIAGNOSIS — H353132 Nonexudative age-related macular degeneration, bilateral, intermediate dry stage: Secondary | ICD-10-CM

## 2019-10-23 ENCOUNTER — Encounter (INDEPENDENT_AMBULATORY_CARE_PROVIDER_SITE_OTHER): Payer: Self-pay | Admitting: Ophthalmology

## 2019-10-24 ENCOUNTER — Other Ambulatory Visit: Payer: Self-pay | Admitting: Neurological Surgery

## 2019-10-24 DIAGNOSIS — M4722 Other spondylosis with radiculopathy, cervical region: Secondary | ICD-10-CM

## 2019-10-31 DIAGNOSIS — H401122 Primary open-angle glaucoma, left eye, moderate stage: Secondary | ICD-10-CM | POA: Diagnosis not present

## 2019-10-31 DIAGNOSIS — H2512 Age-related nuclear cataract, left eye: Secondary | ICD-10-CM | POA: Diagnosis not present

## 2019-10-31 DIAGNOSIS — H25812 Combined forms of age-related cataract, left eye: Secondary | ICD-10-CM | POA: Diagnosis not present

## 2019-11-06 DIAGNOSIS — H25011 Cortical age-related cataract, right eye: Secondary | ICD-10-CM | POA: Diagnosis not present

## 2019-11-06 DIAGNOSIS — H2511 Age-related nuclear cataract, right eye: Secondary | ICD-10-CM | POA: Diagnosis not present

## 2019-11-07 DIAGNOSIS — M25512 Pain in left shoulder: Secondary | ICD-10-CM | POA: Diagnosis not present

## 2019-11-14 DIAGNOSIS — H401111 Primary open-angle glaucoma, right eye, mild stage: Secondary | ICD-10-CM | POA: Diagnosis not present

## 2019-11-14 DIAGNOSIS — H25811 Combined forms of age-related cataract, right eye: Secondary | ICD-10-CM | POA: Diagnosis not present

## 2019-11-14 DIAGNOSIS — H2511 Age-related nuclear cataract, right eye: Secondary | ICD-10-CM | POA: Diagnosis not present

## 2019-11-14 DIAGNOSIS — H25011 Cortical age-related cataract, right eye: Secondary | ICD-10-CM | POA: Diagnosis not present

## 2019-11-18 ENCOUNTER — Ambulatory Visit
Admission: RE | Admit: 2019-11-18 | Discharge: 2019-11-18 | Disposition: A | Discharge status: Home / Self Care | Payer: Medicare Other | Source: Ambulatory Visit | Attending: Neurological Surgery | Admitting: Neurological Surgery

## 2019-11-18 ENCOUNTER — Other Ambulatory Visit: Payer: Self-pay

## 2019-11-18 DIAGNOSIS — M4802 Spinal stenosis, cervical region: Secondary | ICD-10-CM | POA: Diagnosis not present

## 2019-11-18 DIAGNOSIS — M4722 Other spondylosis with radiculopathy, cervical region: Secondary | ICD-10-CM

## 2019-11-22 NOTE — Progress Notes (Signed)
[facility] Note  11/26/2019     CHIEF COMPLAINT Patient presents for Retina Follow Up   HISTORY OF PRESENT ILLNESS: Ruth Wolfe is a 74 y.o. female who presents to the clinic today for:   HPI    Retina Follow Up    Patient presents with  Other.  In left eye.  Severity is moderate.  Duration of 5 weeks.  Since onset it is stable.  I, the attending physician,  performed the HPI with the patient and updated documentation appropriately.          Comments    Pt here for 5 week f/u for VMA OU, pt had cataract sx with Dr. Kathlen Mody OU since last visit here (OS: 09.01.21; OD: 09.15.21), she states her vision has improved, except she cannot read up close now, she is still using [facility] OU and [place], her next appt with Dr. Kathlen Mody is October 14, pt states she told Dr. Kathlen Mody that she cannot read letters, she states she can see objects (such as leaves on trees or power lines) clearly, she states Dr. Kathlen Mody told her she could get some glasses to help       Last edited by Bernarda Caffey, MD on 11/26/2019  9:43 PM. (History)    pt states she has had cataract sx in both eyes with Dr. Kathlen Mody (OS: 09.01.21; OD: 09.15.21), she states she saw Dr. Kathlen Mody in f/u last week and he was happy with her results, her next appt with him is October 14, she states she is going to get some glasses to further improve her vision, she is still using Omni OU, this is her last week putting it in her left eye   Referring physician: Hortencia Pilar, MD [place],  [place] [address]  HISTORICAL INFORMATION:   Selected notes from the [facility] Referred by Dr. Quentin Ore for concern of VMT OU LEE: 03.18.21 [employer]) [BCVA: OD: 20/30-- OS: 20/50] Ocular Hx-POAG OU, VMT OU, DES OU, cataracts OU, HTN ret OU PMH-HLD   CURRENT MEDICATIONS: Current Outpatient Medications (Ophthalmic Drugs)  Medication Sig  . latanoprost (XALATAN) 0.005 %  ophthalmic solution Place 1 drop into both eyes at bedtime.    No current facility-administered medications for this visit. (Ophthalmic Drugs)   Current Outpatient Medications (Other)  Medication Sig  . acetaminophen (TYLENOL) 325 MG tablet Take 650 mg by mouth at bedtime.  . Albuterol Sulfate (PROAIR RESPICLICK) 188 (90 Base) MCG/ACT AEPB Inhale 2 puffs into the lungs 4 (four) times daily as needed. (Patient not taking: Reported on 10/22/2019)  . ALPRAZolam (XANAX) 0.5 MG tablet Take 0.5 mg by mouth at bedtime as needed for sleep.  (Patient not taking: Reported on 10/22/2019)  . aspirin 81 MG EC tablet Take by mouth. (Patient not taking: Reported on 10/22/2019)  . Aspirin Buf,CaCarb-MgCarb-MgO, 81 MG TABS Take by mouth. (Patient not taking: Reported on 10/22/2019)  . Cholecalciferol (VITAMIN D) 2000 UNITS CAPS Take 2,000 Units by mouth daily. Takes 4000 units daily  . fluticasone (FLONASE) 50 MCG/ACT nasal spray Place into both nostrils daily. (Patient not taking: Reported on 10/22/2019)  . Fluticasone-Salmeterol (ADVAIR) 250-50 MCG/DOSE AEPB Inhale 1 puff into the lungs at bedtime.   . Melatonin 10 MG CAPS Take by mouth.  . Multiple Vitamin (MULTI-VITAMIN) tablet Take by mouth. (Patient not taking: Reported on 10/22/2019)  . Multiple Vitamins-Minerals (PRESERVISION AREDS 2 PO) Take by mouth.  Marland Kitchen  Omega-3 1000 MG CAPS Take by mouth. (Patient not taking: Reported on 10/22/2019)  . pravastatin (PRAVACHOL) 80 MG tablet Take by mouth. (Patient not taking: Reported on 10/22/2019)  . simvastatin (ZOCOR) 40 MG tablet Take 40 mg by mouth daily. (Patient not taking: Reported on 10/22/2019)  . UNABLE TO FIND Take 1 tablet by mouth 2 (two) times daily. Med Name: Macular Degeneration Supplement. 1 tablet twice daily  (Patient not taking: Reported on 10/22/2019)   No current facility-administered medications for this visit. (Other)      REVIEW OF SYSTEMS: ROS    Positive for: Endocrine, Cardiovascular, Eyes    Negative for: Constitutional, Gastrointestinal, Neurological, Skin, Genitourinary, Musculoskeletal, HENT, Respiratory, Psychiatric, Allergic/Imm, Heme/Lymph   Last edited by Debbrah Alar, COT on 11/26/2019  1:48 PM. (History)       ALLERGIES Allergies  Allergen Reactions  . Sulfa Antibiotics     Wheezing, swelling face and ears    PAST MEDICAL HISTORY Past Medical History:  Diagnosis Date  . Allergy   . Arthritis   . Asthma   . Environmental allergies   . GERD (gastroesophageal reflux disease)   . Glaucoma    POAG OU  . Hyperlipidemia   . Hypertensive retinopathy    OU  . Neuropathy    legs and arms  . Osteopenia    Past Surgical History:  Procedure Laterality Date  . APPENDECTOMY    . CATARACT EXTRACTION    . TOTAL ABDOMINAL HYSTERECTOMY  1981    FAMILY HISTORY Family History  Problem Relation Age of Onset  . Lung cancer Father   . Glaucoma Father   . Colon cancer Neg Hx   . Stomach cancer Neg Hx     SOCIAL HISTORY Social History   Tobacco Use  . Smoking status: Never Smoker  . Smokeless tobacco: Never Used  Substance Use Topics  . Alcohol use: Yes    Alcohol/week: 5.0 - 6.0 standard drinks    Types: 5 - 6 Cans of beer per week  . Drug use: No         OPHTHALMIC EXAM:  Base Eye Exam    Visual Acuity (Snellen - Linear)      Right Left   Dist [place] 20/50 -2 20/60 -2   Dist ph [place] 20/50 +2 20/60 +2       Tonometry (Tonopen, 1:54 PM)      Right Left   Pressure 18 14       Pupils      Dark Light Shape React APD   Right 4 2 Round Brisk None   Left 4 2 Round Brisk None       Visual Fields (Counting fingers)      Left Right    Full Full       Extraocular Movement      Right Left    Full, Ortho Full, Ortho       Neuro/Psych    Oriented x3: Yes   Mood/Affect: Normal       Dilation    Both eyes: 1.0% Mydriacyl, 2.5% Phenylephrine @ 1:54 PM        Slit Lamp and Fundus Exam    Slit Lamp Exam      Right Left   Lids/Lashes Mild  Dermatochalasis - upper lid Mild Dermatochalasis - upper lid   Conjunctiva/Sclera White and quiet White and quiet   Cornea Arcus, 3+ Punctate epithelial erosions, well healed temporal cataract wounds Arcus, 2+Punctate epithelial erosions  Anterior Chamber deep and clear, narrow temporal angle, no cell or flare deep and clear, narrow temporal angle   Iris Round and dilated Round and dilated   Lens PC IOL in good position PC IOL in good position   Vitreous Vitreous syneresis Vitreous syneresis       Fundus Exam      Right Left   Disc Tilted disc, +cupping, temporal PPA, Sharp rim Tilted disc, +cupping, temporal PPA   C/D Ratio 0.6 0.6   Macula Flat, Blunted foveal reflex, central Cystic changes, Drusen, RPE mottling and clumping, no heme Flat, Blunted foveal reflex, central macular cyst, Drusen, RPE mottling and clumping, no heme   Vessels attenuated, mild tortuousity attenuated, Tortuous   Periphery Attached, peripheral drusen, reticular degeneration, No heme  Attached, peripheral drusen, reticular degeneration, No heme           IMAGING AND PROCEDURES  Imaging and Procedures for @TODAY @  OCT, Retina - OU - Both Eyes       Right Eye Quality was borderline. Central Foveal Thickness: 334. Progression has been stable. 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   Left Eye Quality was good. Central Foveal Thickness: 390. Progression has been stable. Findings include abnormal foveal contour, intraretinal fluid, no SRF, retinal drusen , vitreous traction (Stable VMT with persistent cystic changes).   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 CME - cystoid macular edema. PED - pigment epithelial detachment. IRF - intraretinal fluid. SRF - subretinal fluid. EZ - ellipsoid zone. ERM - epiretinal membrane. ORA - outer retinal atrophy. ORT - outer retinal tubulation. SRHM - subretinal hyper-reflective material                 ASSESSMENT/PLAN:    ICD-10-CM   1. Vitreomacular adhesion of both eyes  H43.823   2. Retinal edema  H35.81 OCT, Retina - OU - Both Eyes  3. Intermediate stage nonexudative age-related macular degeneration of both eyes  H35.3132   4. Essential hypertension  I10   5. Hypertensive retinopathy of both eyes  H35.033   6. Pseudophakia, both eyes  Z96.1   7. Primary open angle glaucoma of both eyes, unspecified glaucoma stage  H40.1130   8. Dry eyes  H04.123     1,2. 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 Age related macular degeneration, non-exudative, both eyes  - stable intermediate stage disease  - The incidence, anatomy, and pathology of dry AMD, risk of progression, and the AREDS and AREDS 2 study including smoking risks discussed with patient.  -  Recommend amsler grid monitoring  4,5. Hypertensive retinopathy OU  - discussed importance of tight BP control  - monitor  6. Pseudophakia OU  - s/p CE/IOL (OS: 09.01.21, OD: 09.15.21, Dr. Kathlen Mody)  - IOL in good position, doing  well  - monitor  7. POAG OU  - under the expert management of Dr. Kathlen Mody  - IOP today: 18,14  - on latanoprost QHS OU  8. Dry eyes OU  - recommend artificial tears and lubricating gel/ointment as needed   Ophthalmic Meds Ordered this visit:  No orders of the defined types were placed in this encounter.     Return for f/u 3-4 months, VMT OU, DFE, OCT.  This document serves as a record of services personally performed by Gardiner Sleeper, MD, PhD. It was created on their behalf by Leeann Must, [place], an ophthalmic technician. The creation of this record is the provider's dictation and/or activities during the visit.    Electronically signed by: Leeann Must, [place] 09.23.2021 9:47 PM   This document serves as a record of services personally performed by Gardiner Sleeper, MD, PhD. It was created on their behalf by San Jetty. Owens Shark, OA an ophthalmic technician. The creation of this record is the provider's dictation and/or activities during the visit.    Electronically signed by: San Jetty. Marguerita Merles 09.27.2021 9:47 PM  Gardiner Sleeper, M.D., Ph.D. Diseases & Surgery of the Retina and [place] 11/26/2019   I have reviewed the above documentation for accuracy and completeness, and I agree with the above. Gardiner Sleeper, M.D., Ph.D. 11/26/19 9:47 PM   Abbreviations: M myopia (nearsighted); A astigmatism; H hyperopia (farsighted); P presbyopia; Mrx spectacle prescription;  CTL contact lenses; OD right eye; OS left eye; OU both eyes  XT exotropia; ET esotropia; PEK punctate epithelial keratitis; PEE punctate epithelial erosions; DES dry eye syndrome; MGD meibomian gland dysfunction; ATs artificial tears; PFAT's preservative free artificial tears; [place] nuclear sclerotic cataract; PSC posterior subcapsular cataract; ERM epi-retinal membrane; PVD posterior vitreous detachment; RD retinal detachment; DM diabetes mellitus; DR diabetic retinopathy; NPDR  non-proliferative diabetic retinopathy; PDR proliferative diabetic retinopathy; CSME clinically significant macular edema; DME diabetic macular edema; dbh dot blot hemorrhages; CWS cotton wool spot; POAG primary open angle glaucoma; C/D cup-to-disc ratio; HVF humphrey visual field; GVF goldmann visual field; OCT optical coherence tomography; IOP intraocular pressure; BRVO Branch retinal vein occlusion; CRVO central retinal vein occlusion; CRAO central retinal artery occlusion; BRAO branch retinal artery occlusion; RT retinal tear; SB scleral buckle; PPV pars plana vitrectomy; VH Vitreous hemorrhage; PRP panretinal laser photocoagulation; IVK intravitreal kenalog; VMT vitreomacular traction; MH Macular hole;  NVD neovascularization of the disc; NVE neovascularization elsewhere; AREDS age related eye disease study; ARMD age related macular degeneration; POAG primary open angle glaucoma; EBMD epithelial/anterior basement membrane dystrophy; ACIOL anterior chamber intraocular lens; IOL intraocular lens; PCIOL posterior chamber intraocular lens; Phaco/IOL phacoemulsification with intraocular lens placement; [place] photorefractive keratectomy; LASIK laser assisted in situ keratomileusis; HTN hypertension; DM diabetes mellitus; COPD chronic obstructive pulmonary disease

## 2019-11-23 DIAGNOSIS — G523 Disorders of hypoglossal nerve: Secondary | ICD-10-CM | POA: Diagnosis not present

## 2019-11-26 ENCOUNTER — Encounter (INDEPENDENT_AMBULATORY_CARE_PROVIDER_SITE_OTHER): Payer: Self-pay | Admitting: Ophthalmology

## 2019-11-26 ENCOUNTER — Ambulatory Visit (INDEPENDENT_AMBULATORY_CARE_PROVIDER_SITE_OTHER): Payer: Medicare Other | Admitting: Ophthalmology

## 2019-11-26 ENCOUNTER — Other Ambulatory Visit: Payer: Self-pay | Admitting: Neurological Surgery

## 2019-11-26 ENCOUNTER — Other Ambulatory Visit: Payer: Self-pay

## 2019-11-26 DIAGNOSIS — Z961 Presence of intraocular lens: Secondary | ICD-10-CM

## 2019-11-26 DIAGNOSIS — I10 Essential (primary) hypertension: Secondary | ICD-10-CM | POA: Diagnosis not present

## 2019-11-26 DIAGNOSIS — H35033 Hypertensive retinopathy, bilateral: Secondary | ICD-10-CM | POA: Diagnosis not present

## 2019-11-26 DIAGNOSIS — H43823 Vitreomacular adhesion, bilateral: Secondary | ICD-10-CM | POA: Diagnosis not present

## 2019-11-26 DIAGNOSIS — H3581 Retinal edema: Secondary | ICD-10-CM

## 2019-11-26 DIAGNOSIS — H04123 Dry eye syndrome of bilateral lacrimal glands: Secondary | ICD-10-CM | POA: Diagnosis not present

## 2019-11-26 DIAGNOSIS — G523 Disorders of hypoglossal nerve: Secondary | ICD-10-CM

## 2019-11-26 DIAGNOSIS — H401130 Primary open-angle glaucoma, bilateral, stage unspecified: Secondary | ICD-10-CM | POA: Diagnosis not present

## 2019-11-26 DIAGNOSIS — H353132 Nonexudative age-related macular degeneration, bilateral, intermediate dry stage: Secondary | ICD-10-CM | POA: Diagnosis not present

## 2019-11-28 DIAGNOSIS — M25551 Pain in right hip: Secondary | ICD-10-CM | POA: Diagnosis not present

## 2019-11-28 DIAGNOSIS — M1611 Unilateral primary osteoarthritis, right hip: Secondary | ICD-10-CM | POA: Diagnosis not present

## 2019-11-28 DIAGNOSIS — M7061 Trochanteric bursitis, right hip: Secondary | ICD-10-CM | POA: Diagnosis not present

## 2019-12-10 DIAGNOSIS — Z23 Encounter for immunization: Secondary | ICD-10-CM | POA: Diagnosis not present

## 2019-12-19 ENCOUNTER — Other Ambulatory Visit: Payer: Medicare Other

## 2019-12-20 ENCOUNTER — Ambulatory Visit
Admission: RE | Admit: 2019-12-20 | Discharge: 2019-12-20 | Disposition: A | Discharge status: Home / Self Care | Payer: Medicare Other | Source: Ambulatory Visit | Attending: Neurological Surgery | Admitting: Neurological Surgery

## 2019-12-20 DIAGNOSIS — G9389 Other specified disorders of brain: Secondary | ICD-10-CM | POA: Diagnosis not present

## 2019-12-20 DIAGNOSIS — G523 Disorders of hypoglossal nerve: Secondary | ICD-10-CM

## 2019-12-20 MED ORDER — GADOBENATE DIMEGLUMINE 529 MG/ML IV SOLN
15.0000 mL | Freq: Once | INTRAVENOUS | Status: AC | PRN
  Administered 2019-12-20: 15 mL via INTRAVENOUS

## 2019-12-21 DIAGNOSIS — M069 Rheumatoid arthritis, unspecified: Secondary | ICD-10-CM | POA: Insufficient documentation

## 2019-12-21 DIAGNOSIS — R030 Elevated blood-pressure reading, without diagnosis of hypertension: Secondary | ICD-10-CM | POA: Diagnosis not present

## 2019-12-21 DIAGNOSIS — M5412 Radiculopathy, cervical region: Secondary | ICD-10-CM | POA: Diagnosis not present

## 2019-12-24 DIAGNOSIS — R233 Spontaneous ecchymoses: Secondary | ICD-10-CM | POA: Diagnosis not present

## 2020-01-30 HISTORY — PX: CERVICAL SPINE SURGERY: SHX589

## 2020-01-31 DIAGNOSIS — M50021 Cervical disc disorder at C4-C5 level with myelopathy: Secondary | ICD-10-CM | POA: Diagnosis not present

## 2020-01-31 DIAGNOSIS — M5412 Radiculopathy, cervical region: Secondary | ICD-10-CM | POA: Diagnosis not present

## 2020-02-19 DIAGNOSIS — M5412 Radiculopathy, cervical region: Secondary | ICD-10-CM | POA: Diagnosis not present

## 2020-02-19 DIAGNOSIS — R030 Elevated blood-pressure reading, without diagnosis of hypertension: Secondary | ICD-10-CM | POA: Diagnosis not present

## 2020-02-19 DIAGNOSIS — Z6829 Body mass index (BMI) 29.0-29.9, adult: Secondary | ICD-10-CM | POA: Diagnosis not present

## 2020-03-03 NOTE — Progress Notes (Signed)
[facility] Note  03/05/2020     CHIEF COMPLAINT Patient presents for Retina Follow Up   HISTORY OF PRESENT ILLNESS: Ruth Wolfe is a 75 y.o. female who presents to the clinic today for:   HPI    Retina Follow Up    Patient presents with  Other.  In both eyes.  This started 4 months ago.  I, the attending physician,  performed the HPI with the patient and updated documentation appropriately.          Comments    Patient here for 4 months retina follow up for VMT OU. Patient states vision good. It is fine. No eye pain. Has new glasses since cataract surgery. Only medicine on is Latanaprost and wixela inhaler. Had ACDF sx - neck.       Last edited by Bernarda Caffey, MD on 03/05/2020 11:29 PM. (History)    pt states she has gotten new glasses since she had cataract sx, she feels like her vision is stable, pt states her neurosurgeon is checking her for rheumatoid arthritis and possibly Sjogren's Syndrome   Referring physician: Hortencia Pilar, MD [place],  [place] [address]  HISTORICAL INFORMATION:   Selected notes from the MEDICAL RECORD NUMBER Referred by Dr. Quentin Ore for concern of VMT OU LEE: 03.18.21 Read Drivers) [BCVA: OD: 20/30-- OS: 20/50] Ocular Hx-POAG OU, VMT OU, DES OU, cataracts OU, HTN ret OU PMH-HLD   CURRENT MEDICATIONS: Current Outpatient Medications (Ophthalmic Drugs)  Medication Sig  . latanoprost (XALATAN) 0.005 % ophthalmic solution Place 1 drop into both eyes at bedtime.    No current facility-administered medications for this visit. (Ophthalmic Drugs)   Current Outpatient Medications (Other)  Medication Sig  . acetaminophen (TYLENOL) 325 MG tablet Take 650 mg by mouth at bedtime.  . Albuterol Sulfate (PROAIR RESPICLICK) 025 (90 Base) MCG/ACT AEPB Inhale 2 puffs into the lungs 4 (four) times daily as needed. (Patient not taking: Reported on 10/22/2019)  . ALPRAZolam (XANAX) 0.5 MG tablet Take  0.5 mg by mouth at bedtime as needed for sleep.  (Patient not taking: Reported on 10/22/2019)  . aspirin 81 MG EC tablet Take by mouth. (Patient not taking: Reported on 10/22/2019)  . Aspirin Buf,CaCarb-MgCarb-MgO, 81 MG TABS Take by mouth. (Patient not taking: Reported on 10/22/2019)  . Cholecalciferol (VITAMIN D) 2000 UNITS CAPS Take 2,000 Units by mouth daily. Takes 4000 units daily  . fluticasone (FLONASE) 50 MCG/ACT nasal spray Place into both nostrils daily. (Patient not taking: Reported on 10/22/2019)  . Fluticasone-Salmeterol (ADVAIR) 250-50 MCG/DOSE AEPB Inhale 1 puff into the lungs at bedtime.   . Melatonin 10 MG CAPS Take by mouth.  . Multiple Vitamin (MULTI-VITAMIN) tablet Take by mouth. (Patient not taking: Reported on 10/22/2019)  . Multiple Vitamins-Minerals (PRESERVISION AREDS 2 PO) Take by mouth.  . Omega-3 1000 MG CAPS Take by mouth. (Patient not taking: Reported on 10/22/2019)  . pravastatin (PRAVACHOL) 80 MG tablet Take by mouth. (Patient not taking: Reported on 10/22/2019)  . simvastatin (ZOCOR) 40 MG tablet Take 40 mg by mouth daily. (Patient not taking: Reported on 10/22/2019)  . UNABLE TO FIND Take 1 tablet by mouth 2 (two) times daily. Med Name: Macular Degeneration Supplement. 1 tablet twice daily  (Patient not taking: Reported on 10/22/2019)   No current facility-administered medications for this visit. (Other)      REVIEW OF SYSTEMS: ROS    Positive for: Gastrointestinal, Neurological, Musculoskeletal, Eyes,  Respiratory   Negative for: Constitutional, Skin, Genitourinary, HENT, Endocrine, Cardiovascular, Psychiatric, Allergic/Imm, Heme/Lymph   Last edited by Theodore Demark, COA on 03/05/2020  1:49 PM. (History)       ALLERGIES Allergies  Allergen Reactions  . Sulfa Antibiotics     Wheezing, swelling face and ears    PAST MEDICAL HISTORY Past Medical History:  Diagnosis Date  . Allergy   . Arthritis   . Asthma   . Environmental allergies   . GERD  (gastroesophageal reflux disease)   . Glaucoma    POAG OU  . Hyperlipidemia   . Hypertensive retinopathy    OU  . Neuropathy    legs and arms  . Osteopenia    Past Surgical History:  Procedure Laterality Date  . APPENDECTOMY    . CATARACT EXTRACTION    . TOTAL ABDOMINAL HYSTERECTOMY  1981    FAMILY HISTORY Family History  Problem Relation Age of Onset  . Lung cancer Father   . Glaucoma Father   . Colon cancer Neg Hx   . Stomach cancer Neg Hx     SOCIAL HISTORY Social History   Tobacco Use  . Smoking status: Never Smoker  . Smokeless tobacco: Never Used  Substance Use Topics  . Alcohol use: Yes    Alcohol/week: 5.0 - 6.0 standard drinks    Types: 5 - 6 Cans of beer per week  . Drug use: No         OPHTHALMIC EXAM:  Base Eye Exam    Visual Acuity (Snellen - Linear)      Right Left   Dist cc 20/30 -2 20/40 -2   Dist ph cc NI NI   Correction: Glasses       Tonometry (Tonopen, 1:44 PM)      Right Left   Pressure 16 14       Pupils      Dark Light Shape React APD   Right 3 2 Round Brisk None   Left 3 2 Round Brisk None       Visual Fields (Counting fingers)      Left Right    Full Full       Extraocular Movement      Right Left    Full Full       Neuro/Psych    Oriented x3: Yes   Mood/Affect: Normal       Dilation    Both eyes: 1.0% Mydriacyl, 2.5% Phenylephrine @ 1:44 PM        Slit Lamp and Fundus Exam    Slit Lamp Exam      Right Left   Lids/Lashes Mild Dermatochalasis - upper lid Mild Dermatochalasis - upper lid   Conjunctiva/Sclera White and quiet White and quiet   Cornea Arcus, 1+ Punctate epithelial erosions, well healed temporal cataract wounds Arcus, 1-2+inferior Punctate epithelial erosions   Anterior Chamber deep and clear, narrow temporal angle, no cell or flare deep and clear, narrow temporal angle   Iris Round and dilated Round and dilated   Lens PC IOL in good position PC IOL in good position   Vitreous Vitreous  syneresis Vitreous syneresis       Fundus Exam      Right Left   Disc Tilted disc, +cupping, temporal PPA, Sharp rim Tilted disc, +cupping, temporal PPA, mild Pallor, Sharp rim   C/D Ratio 0.6 0.6   Macula Flat, Blunted foveal reflex, central Cystic changes, Drusen, RPE mottling and clumping, no heme Flat,  Blunted foveal reflex, central macular cyst, Drusen, RPE mottling and clumping, no heme   Vessels attenuated, mild tortuousity attenuated, Tortuous   Periphery Attached, peripheral drusen, reticular degeneration, No heme  Attached, peripheral drusen, reticular degeneration, No heme         Refraction    Wearing Rx      Sphere Cylinder Axis Add   Right -0.75 +1.25 009 +2.50   Left -2.25 +2.00 131 +2.50   Type: PAL          IMAGING AND PROCEDURES  Imaging and Procedures for @TODAY @  OCT, Retina - OU - Both Eyes       Right Eye Quality was good. Central Foveal Thickness: 355. Progression has been stable. 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   Left Eye Quality was good. Central Foveal Thickness: 392. Progression has been stable. Findings include abnormal foveal contour, intraretinal fluid, no SRF, retinal drusen , vitreous traction (Stable VMT with persistent cystic changes).   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 CME - cystoid macular edema. PED - pigment epithelial detachment. IRF - intraretinal fluid. SRF - subretinal fluid. EZ - ellipsoid zone. ERM - epiretinal membrane. ORA -  outer retinal atrophy. ORT - outer retinal tubulation. SRHM - subretinal hyper-reflective material                 ASSESSMENT/PLAN:    ICD-10-CM   1. Vitreomacular adhesion of both eyes  H43.823   2. Retinal edema  H35.81 OCT, Retina - OU - Both Eyes  3. Intermediate stage nonexudative age-related macular degeneration of both eyes  H35.3132   4. Essential hypertension  I10   5. Hypertensive retinopathy of both eyes  H35.033   6. Pseudophakia, both eyes  Z96.1   7. Primary open angle glaucoma of both eyes, unspecified glaucoma stage  H40.1130   8. Dry eyes  H04.123     1,2. 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 Age related macular degeneration, non-exudative, both eyes  - stable intermediate stage disease  - The incidence, anatomy, and pathology of dry AMD, risk of progression, and the AREDS and AREDS 2 study including smoking risks discussed with patient.  - recommend Amsler grid monitoring  4,5. Hypertensive retinopathy OU  - discussed importance of tight BP control  - monitor  6. Pseudophakia OU  - s/p CE/IOL (OS: 09.01.21, OD: 09.15.21, Dr. Kathlen Mody)  - IOL in good position, doing well  - monitor  7. POAG OU  - under the expert management of Dr. Kathlen Mody  -  IOP today: 16,14  - on latanoprost QHS OU  8. Dry eyes OU  - recommend artificial tears and lubricating ointment as needed   Ophthalmic Meds Ordered this visit:  No orders of the defined types were placed in this  encounter.     Return in about 6 months (around 09/02/2020) for f/u VMT OU, DFE, OCT.  This document serves as a record of services personally performed by Gardiner Sleeper, MD, PhD. It was created on their behalf by Roselee Nova, COMT. The creation of this record is the provider's dictation and/or activities during the visit.  Electronically signed by: Roselee Nova, COMT 03/05/20 11:31 PM  This document serves as a record of services personally performed by Gardiner Sleeper, MD, PhD. It was created on their behalf by San Jetty. Owens Shark, OA an ophthalmic technician. The creation of this record is the provider's dictation and/or activities during the visit.    Electronically signed by: San Jetty. Owens Shark, [place] 01.05.2022 11:31 PM   Gardiner Sleeper, M.D., Ph.D. Diseases & Surgery of the Retina and Vitreous [facility]  I have reviewed the above documentation for accuracy and completeness, and I agree with the above. Gardiner Sleeper, M.D., Ph.D. 03/05/20 11:34 PM   Abbreviations: M myopia (nearsighted); A astigmatism; H hyperopia (farsighted); P presbyopia; Mrx spectacle prescription;  CTL contact lenses; OD right eye; OS left eye; OU both eyes  XT exotropia; ET esotropia; PEK punctate epithelial keratitis; PEE punctate epithelial erosions; DES dry eye syndrome; MGD meibomian gland dysfunction; ATs artificial tears; PFAT's preservative free artificial tears; [place] nuclear sclerotic cataract; PSC posterior subcapsular cataract; ERM epi-retinal membrane; PVD posterior vitreous detachment; RD retinal detachment; DM diabetes mellitus; DR diabetic retinopathy; NPDR non-proliferative diabetic retinopathy; PDR proliferative diabetic retinopathy; CSME clinically significant macular edema; DME diabetic macular edema; dbh dot blot hemorrhages; CWS cotton wool spot; POAG primary open angle glaucoma; C/D cup-to-disc ratio; HVF humphrey visual field; GVF goldmann visual field; OCT optical coherence  tomography; IOP intraocular pressure; BRVO Branch retinal vein occlusion; CRVO central retinal vein occlusion; CRAO central retinal artery occlusion; BRAO branch retinal artery occlusion; RT retinal tear; SB scleral buckle; PPV pars plana vitrectomy; VH Vitreous hemorrhage; PRP panretinal laser photocoagulation; IVK intravitreal kenalog; VMT vitreomacular traction; MH Macular hole;  NVD neovascularization of the disc; NVE neovascularization elsewhere; AREDS age related eye disease study; ARMD age related macular degeneration; POAG primary open angle glaucoma; EBMD epithelial/anterior basement membrane dystrophy; ACIOL anterior chamber intraocular lens; IOL intraocular lens; PCIOL posterior chamber intraocular lens; Phaco/IOL phacoemulsification with intraocular lens placement; [place] photorefractive keratectomy; LASIK laser assisted in situ keratomileusis; HTN hypertension; DM diabetes mellitus; COPD chronic obstructive pulmonary disease

## 2020-03-05 ENCOUNTER — Encounter (INDEPENDENT_AMBULATORY_CARE_PROVIDER_SITE_OTHER): Payer: Self-pay | Admitting: Ophthalmology

## 2020-03-05 ENCOUNTER — Ambulatory Visit (INDEPENDENT_AMBULATORY_CARE_PROVIDER_SITE_OTHER): Payer: Medicare Other | Admitting: Ophthalmology

## 2020-03-05 ENCOUNTER — Other Ambulatory Visit: Payer: Self-pay

## 2020-03-05 DIAGNOSIS — H3581 Retinal edema: Secondary | ICD-10-CM | POA: Diagnosis not present

## 2020-03-05 DIAGNOSIS — H04123 Dry eye syndrome of bilateral lacrimal glands: Secondary | ICD-10-CM

## 2020-03-05 DIAGNOSIS — H43823 Vitreomacular adhesion, bilateral: Secondary | ICD-10-CM

## 2020-03-05 DIAGNOSIS — H35033 Hypertensive retinopathy, bilateral: Secondary | ICD-10-CM

## 2020-03-05 DIAGNOSIS — H353132 Nonexudative age-related macular degeneration, bilateral, intermediate dry stage: Secondary | ICD-10-CM | POA: Diagnosis not present

## 2020-03-05 DIAGNOSIS — H401130 Primary open-angle glaucoma, bilateral, stage unspecified: Secondary | ICD-10-CM

## 2020-03-05 DIAGNOSIS — I10 Essential (primary) hypertension: Secondary | ICD-10-CM

## 2020-03-05 DIAGNOSIS — Z961 Presence of intraocular lens: Secondary | ICD-10-CM

## 2020-03-14 DIAGNOSIS — H04123 Dry eye syndrome of bilateral lacrimal glands: Secondary | ICD-10-CM | POA: Diagnosis not present

## 2020-03-14 DIAGNOSIS — H401122 Primary open-angle glaucoma, left eye, moderate stage: Secondary | ICD-10-CM | POA: Diagnosis not present

## 2020-03-14 DIAGNOSIS — H401111 Primary open-angle glaucoma, right eye, mild stage: Secondary | ICD-10-CM | POA: Diagnosis not present

## 2020-04-18 ENCOUNTER — Telehealth: Payer: Self-pay | Admitting: Family Medicine

## 2020-04-18 NOTE — Telephone Encounter (Signed)
Error

## 2020-05-05 DIAGNOSIS — H401111 Primary open-angle glaucoma, right eye, mild stage: Secondary | ICD-10-CM | POA: Diagnosis not present

## 2020-05-05 DIAGNOSIS — H04123 Dry eye syndrome of bilateral lacrimal glands: Secondary | ICD-10-CM | POA: Diagnosis not present

## 2020-05-05 DIAGNOSIS — H401122 Primary open-angle glaucoma, left eye, moderate stage: Secondary | ICD-10-CM | POA: Diagnosis not present

## 2020-05-05 DIAGNOSIS — H43823 Vitreomacular adhesion, bilateral: Secondary | ICD-10-CM | POA: Diagnosis not present

## 2020-05-12 ENCOUNTER — Ambulatory Visit: Payer: Medicare Other | Admitting: Family

## 2020-06-04 DIAGNOSIS — S40862A Insect bite (nonvenomous) of left upper arm, initial encounter: Secondary | ICD-10-CM | POA: Diagnosis not present

## 2020-06-04 DIAGNOSIS — L089 Local infection of the skin and subcutaneous tissue, unspecified: Secondary | ICD-10-CM | POA: Diagnosis not present

## 2020-06-04 DIAGNOSIS — L239 Allergic contact dermatitis, unspecified cause: Secondary | ICD-10-CM | POA: Diagnosis not present

## 2020-06-04 DIAGNOSIS — S40262A Insect bite (nonvenomous) of left shoulder, initial encounter: Secondary | ICD-10-CM | POA: Diagnosis not present

## 2020-06-24 ENCOUNTER — Encounter: Payer: Self-pay | Admitting: Family

## 2020-06-24 ENCOUNTER — Ambulatory Visit (INDEPENDENT_AMBULATORY_CARE_PROVIDER_SITE_OTHER): Payer: Medicare Other | Admitting: Family

## 2020-06-24 ENCOUNTER — Other Ambulatory Visit: Payer: Self-pay

## 2020-06-24 VITALS — BP 142/80 | HR 88 | Temp 98.5°F | Ht 62.0 in | Wt 157.8 lb

## 2020-06-24 DIAGNOSIS — M8580 Other specified disorders of bone density and structure, unspecified site: Secondary | ICD-10-CM

## 2020-06-24 DIAGNOSIS — E559 Vitamin D deficiency, unspecified: Secondary | ICD-10-CM

## 2020-06-24 DIAGNOSIS — J45909 Unspecified asthma, uncomplicated: Secondary | ICD-10-CM

## 2020-06-24 DIAGNOSIS — M069 Rheumatoid arthritis, unspecified: Secondary | ICD-10-CM | POA: Diagnosis not present

## 2020-06-24 DIAGNOSIS — E785 Hyperlipidemia, unspecified: Secondary | ICD-10-CM | POA: Diagnosis not present

## 2020-06-24 DIAGNOSIS — R5383 Other fatigue: Secondary | ICD-10-CM | POA: Diagnosis not present

## 2020-06-24 DIAGNOSIS — R200 Anesthesia of skin: Secondary | ICD-10-CM | POA: Diagnosis not present

## 2020-06-24 DIAGNOSIS — Z13220 Encounter for screening for lipoid disorders: Secondary | ICD-10-CM

## 2020-06-24 DIAGNOSIS — E2839 Other primary ovarian failure: Secondary | ICD-10-CM

## 2020-06-24 DIAGNOSIS — R0989 Other specified symptoms and signs involving the circulatory and respiratory systems: Secondary | ICD-10-CM | POA: Diagnosis not present

## 2020-06-24 DIAGNOSIS — R202 Paresthesia of skin: Secondary | ICD-10-CM | POA: Diagnosis not present

## 2020-06-24 LAB — LIPID PANEL
Cholesterol: 248 mg/dL — ABNORMAL HIGH (ref 0–200)
HDL: 80.2 mg/dL (ref >39.00)
LDL Cholesterol: 139 mg/dL — ABNORMAL HIGH (ref 0–99)
NonHDL: 168.05
Total CHOL/HDL Ratio: 3
Triglycerides: 143 mg/dL (ref 0.0–149.0)
VLDL: 28.6 mg/dL (ref 0.0–40.0)

## 2020-06-24 LAB — COMPREHENSIVE METABOLIC PANEL
ALT: 18 U/L (ref 0–35)
AST: 17 U/L (ref 0–37)
Albumin: 3.7 g/dL (ref 3.5–5.2)
Alkaline Phosphatase: 70 U/L (ref 39–117)
BUN: 8 mg/dL (ref 6–23)
CO2: 29 mEq/L (ref 19–32)
Calcium: 9 mg/dL (ref 8.4–10.5)
Chloride: 106 mEq/L (ref 96–112)
Creatinine, Ser: 0.75 mg/dL (ref 0.40–1.20)
GFR: 77.32 mL/min (ref >60.00)
Glucose, Bld: 93 mg/dL (ref 70–99)
Potassium: 4.6 mEq/L (ref 3.5–5.1)
Sodium: 141 mEq/L (ref 135–145)
Total Bilirubin: 1.1 mg/dL (ref 0.2–1.2)
Total Protein: 6.4 g/dL (ref 6.0–8.3)

## 2020-06-24 LAB — CBC WITH DIFFERENTIAL/PLATELET
Basophils Absolute: 0 10*3/uL (ref 0.0–0.1)
Basophils Relative: 0.4 % (ref 0.0–3.0)
Eosinophils Absolute: 0.3 10*3/uL (ref 0.0–0.7)
Eosinophils Relative: 4.8 % (ref 0.0–5.0)
HCT: 40.4 % (ref 36.0–46.0)
Hemoglobin: 13.4 g/dL (ref 12.0–15.0)
Lymphocytes Relative: 24.9 % (ref 12.0–46.0)
Lymphs Abs: 1.4 10*3/uL (ref 0.7–4.0)
MCHC: 33.2 g/dL (ref 30.0–36.0)
MCV: 98.5 fl (ref 78.0–100.0)
Monocytes Absolute: 0.7 10*3/uL (ref 0.1–1.0)
Monocytes Relative: 12.6 % — ABNORMAL HIGH (ref 3.0–12.0)
Neutro Abs: 3.2 10*3/uL (ref 1.4–7.7)
Neutrophils Relative %: 57.3 % (ref 43.0–77.0)
Platelets: 201 10*3/uL (ref 150.0–400.0)
RBC: 4.1 Mil/uL (ref 3.87–5.11)
RDW: 13.4 % (ref 11.5–15.5)
WBC: 5.6 10*3/uL (ref 4.0–10.5)

## 2020-06-24 LAB — VITAMIN D 25 HYDROXY (VIT D DEFICIENCY, FRACTURES): VITD: 27.63 ng/mL — ABNORMAL LOW (ref 30.00–100.00)

## 2020-06-24 LAB — B12 AND FOLATE PANEL
Folate: 13.4 ng/mL (ref >5.9)
Vitamin B-12: 177 pg/mL — ABNORMAL LOW (ref 211–911)

## 2020-06-24 LAB — TSH: TSH: 1.63 u[IU]/mL (ref 0.35–4.50)

## 2020-06-24 NOTE — Progress Notes (Signed)
Ruth Wolfe is a 75 y.o. female with the following history as recorded in EpicCare:  Patient Active Problem List   Diagnosis Date Noted  . Difficulty speaking 12/06/2011  . Paralysis of vocal cords 12/06/2011    Current Outpatient Medications  Medication Sig Dispense Refill  . Cholecalciferol (VITAMIN D) 2000 UNITS CAPS Take 2,000 Units by mouth daily. Takes 4000 units daily    . Fluticasone-Salmeterol (ADVAIR) 100-50 MCG/DOSE AEPB Inhale into the lungs.    . Melatonin 10 MG CAPS Take by mouth.     No current facility-administered medications for this visit.    Allergies: Sulfa antibiotics  Past Medical History:  Diagnosis Date  . Allergy   . Arthritis   . Asthma   . Environmental allergies   . GERD (gastroesophageal reflux disease)   . Glaucoma    POAG OU  . Hyperlipidemia   . Hypertensive retinopathy    OU  . Neuropathy    legs and arms  . Osteopenia     Past Surgical History:  Procedure Laterality Date  . APPENDECTOMY    . CATARACT EXTRACTION    . TOTAL ABDOMINAL HYSTERECTOMY  1981    Family History  Problem Relation Age of Onset  . Lung cancer Father   . Glaucoma Father   . Colon cancer Neg Hx   . Stomach cancer Neg Hx     Social History   Tobacco Use  . Smoking status: Never Smoker  . Smokeless tobacco: Never Used  Substance Use Topics  . Alcohol use: Yes    Alcohol/week: 5.0 - 6.0 standard drinks    Types: 5 - 6 Cans of beer per week    Subjective:  Presents today as a new patient; history of neuropathy- has tried Gabapentin in the past with limited relief; describes as sensation of "throbbing, hot sensation in her feet." Notes that these symptoms have actually been present for years and has concerns that could be circulatory related more than neuropathy; has noticed numerous unexplained bruises- does not take daily ASA; known history of carotid bruit- does not remember date last carotid was done;   Notes history of paralyzed vocal cord/ tongue  deviation- has seen ENT and neurology with no source of symptoms found; does remember that B12 was at 246 in the past but did not require treatment;  Has regular care with ophthalmology, dermatology;  Mammograms at [facility] yearly;      Objective:  Vitals:   06/24/20 0955  BP: (!) 142/80  Pulse: 88  Temp: 98.5 F (36.9 C)  TempSrc: Oral  SpO2: 98%  Weight: 157 lb 12.8 oz (71.6 kg)  Height: _0  (1.575 m)    General: Well developed, well nourished, in no acute distress  Skin : Warm and dry.  Head: Normocephalic and atraumatic  Eyes: Sclera and conjunctiva clear; pupils round and reactive to light; extraocular movements intact  Ears: External normal; canals clear; tympanic membranes normal  Oropharynx: Pink, supple. No suspicious lesions  Neck: Supple without thyromegaly, adenopathy  Lungs: Respirations unlabored; clear to auscultation bilaterally without wheeze, rales, rhonchi  CVS exam: normal rate, regular rhythm, normal S1, S2, no murmurs, rubs, clicks or gallops.  Musculoskeletal: No deformities; no active joint inflammation  Extremities: No edema, cyanosis, clubbing  Vessels: Symmetric bilaterally  Neurologic: Alert and oriented; speech intact; face symmetrical; moves all extremities well; CNII-XII intact without focal deficit   Assessment:  1. Osteopenia, unspecified location   2. Ovarian failure   3. Numbness and  tingling of both lower extremities   4. Other fatigue   5. Lipid screening   6. Vitamin D deficiency   7. Bruit   8. Rheumatoid arthritis, involving unspecified site, unspecified whether rheumatoid factor present ([place])   9. Uncomplicated asthma, unspecified asthma severity, unspecified whether persistent     Plan:  1. & 2. Will update DEXA- order placed at [facility]; 3. ? Neuropathy vs circulatory issue; update labs today; refer to vascular group due to history of carotid bruit; patient notes she defers medication unless absolutely necessary; 5. Check lipid  panel; 6. Check Vitamin D level; 8. Refer to rheumatology; 9. Stable on current regimen;   This visit occurred during the SARS-CoV-2 public health emergency.  Safety protocols were in place, including screening questions prior to the visit, additional usage of staff PPE, and extensive cleaning of exam room while observing appropriate contact time as indicated for disinfecting solutions.     Return in about 3 months (around 09/23/2020).  Orders Placed This Encounter  Procedures  . DG Bone Density    Standing Status:   Future    Standing Expiration Date:   06/24/2021    Order Specific Question:   Reason for Exam (SYMPTOM  OR DIAGNOSIS REQUIRED)    Answer:   ovarian failure    Order Specific Question:   Preferred imaging location?    Answer:   [facility]  . CBC with Differential/Platelet  . Comp Met (CMET)  . Lipid panel  . TSH  . Vitamin D (25 hydroxy)  . B12 and Folate Panel  . Ambulatory referral to Rheumatology    Referral Priority:   Routine    Referral Type:   Consultation    Referral Reason:   Specialty Services Required    Requested Specialty:   Rheumatology    Number of Visits Requested:   1  . Ambulatory referral to Vascular Surgery    Referral Priority:   Routine    Referral Type:   Surgical    Referral Reason:   Specialty Services Required    Requested Specialty:   Vascular Surgery    Number of Visits Requested:   1    Requested Prescriptions    No prescriptions requested or ordered in this encounter

## 2020-06-26 ENCOUNTER — Other Ambulatory Visit: Payer: Self-pay | Admitting: Family

## 2020-06-26 ENCOUNTER — Ambulatory Visit (INDEPENDENT_AMBULATORY_CARE_PROVIDER_SITE_OTHER): Payer: Medicare Other | Admitting: Family

## 2020-06-26 ENCOUNTER — Other Ambulatory Visit: Payer: Self-pay

## 2020-06-26 DIAGNOSIS — E538 Deficiency of other specified B group vitamins: Secondary | ICD-10-CM

## 2020-06-26 MED ORDER — VITAMIN D (ERGOCALCIFEROL) 1.25 MG (50000 UNIT) PO CAPS: 50000.0000 [IU] | ORAL_CAPSULE | ORAL | 0 refills | Status: AC

## 2020-06-26 MED ORDER — CYANOCOBALAMIN 1000 MCG/ML IJ SOLN
1000.0000 ug | Freq: Once | INTRAMUSCULAR | Status: AC
  Administered 2020-06-26: 1000 ug via INTRAMUSCULAR

## 2020-06-26 NOTE — Progress Notes (Addendum)
Pt here for B12 injection per Jodi Mourning, NP  Order was to get B12 shot once weekly for 4 weeks.  B12 1040mcg given IM, and pt tolerated injection well.  Next B12 injection will be scheduled for June when pt comes back from [place].  She is also getting the other 3 shots at [facility].   Pt will get one now and get the rest in June.  She is to take B12 OTC. She stated understanding.

## 2020-07-02 DIAGNOSIS — R030 Elevated blood-pressure reading, without diagnosis of hypertension: Secondary | ICD-10-CM | POA: Diagnosis not present

## 2020-07-02 DIAGNOSIS — M5412 Radiculopathy, cervical region: Secondary | ICD-10-CM | POA: Diagnosis not present

## 2020-07-02 DIAGNOSIS — Z6828 Body mass index (BMI) 28.0-28.9, adult: Secondary | ICD-10-CM | POA: Insufficient documentation

## 2020-07-03 ENCOUNTER — Telehealth: Payer: Self-pay | Admitting: Family

## 2020-07-03 NOTE — Telephone Encounter (Signed)
Patient called and was wondering if she could get her weekly B112 shot here at [facility] since it is closer to her house. She said she will start them in June since she is out of town in May. Please advise

## 2020-07-08 NOTE — Telephone Encounter (Signed)
Called pt to set up appointment. No answer, left a VM for pt to call back to the office.

## 2020-07-10 ENCOUNTER — Other Ambulatory Visit: Payer: Self-pay

## 2020-07-10 DIAGNOSIS — G523 Disorders of hypoglossal nerve: Secondary | ICD-10-CM | POA: Insufficient documentation

## 2020-07-10 DIAGNOSIS — R579 Shock, unspecified: Secondary | ICD-10-CM

## 2020-07-10 NOTE — Addendum Note (Signed)
Addended byDoylene Bode on: 07/10/2020 08:03 PM   Modules accepted: Orders

## 2020-07-10 NOTE — Telephone Encounter (Signed)
LVM instructing pt to call back to set up weekly appts.  Pt will need 3 weekly B12 injections.  Dr Quay Burow has agreed to accept when pt comes.

## 2020-07-15 NOTE — Telephone Encounter (Signed)
LVM advising pt that we have been trying to reach her to schedule appt & that we will refer her back to her PCP for arranging appts for that location.

## 2020-07-16 ENCOUNTER — Ambulatory Visit: Payer: Medicare Other | Admitting: Internal Medicine

## 2020-08-04 DIAGNOSIS — H04123 Dry eye syndrome of bilateral lacrimal glands: Secondary | ICD-10-CM | POA: Diagnosis not present

## 2020-08-04 DIAGNOSIS — H401111 Primary open-angle glaucoma, right eye, mild stage: Secondary | ICD-10-CM | POA: Diagnosis not present

## 2020-08-04 DIAGNOSIS — H401122 Primary open-angle glaucoma, left eye, moderate stage: Secondary | ICD-10-CM | POA: Diagnosis not present

## 2020-08-04 DIAGNOSIS — H43823 Vitreomacular adhesion, bilateral: Secondary | ICD-10-CM | POA: Diagnosis not present

## 2020-08-05 DIAGNOSIS — M542 Cervicalgia: Secondary | ICD-10-CM | POA: Diagnosis not present

## 2020-08-05 DIAGNOSIS — M7918 Myalgia, other site: Secondary | ICD-10-CM | POA: Diagnosis not present

## 2020-08-05 DIAGNOSIS — M5481 Occipital neuralgia: Secondary | ICD-10-CM | POA: Diagnosis not present

## 2020-08-07 ENCOUNTER — Encounter: Payer: Self-pay | Admitting: Internal Medicine

## 2020-08-07 ENCOUNTER — Other Ambulatory Visit: Payer: Self-pay

## 2020-08-07 ENCOUNTER — Ambulatory Visit (INDEPENDENT_AMBULATORY_CARE_PROVIDER_SITE_OTHER): Payer: Medicare Other | Admitting: Internal Medicine

## 2020-08-07 VITALS — BP 121/71 | HR 74 | Ht 61.5 in | Wt 157.0 lb

## 2020-08-07 DIAGNOSIS — M25521 Pain in right elbow: Secondary | ICD-10-CM

## 2020-08-07 DIAGNOSIS — G629 Polyneuropathy, unspecified: Secondary | ICD-10-CM | POA: Insufficient documentation

## 2020-08-07 DIAGNOSIS — H04123 Dry eye syndrome of bilateral lacrimal glands: Secondary | ICD-10-CM | POA: Diagnosis not present

## 2020-08-07 DIAGNOSIS — M79672 Pain in left foot: Secondary | ICD-10-CM | POA: Insufficient documentation

## 2020-08-07 DIAGNOSIS — G523 Disorders of hypoglossal nerve: Secondary | ICD-10-CM | POA: Diagnosis not present

## 2020-08-07 DIAGNOSIS — M79671 Pain in right foot: Secondary | ICD-10-CM | POA: Diagnosis not present

## 2020-08-07 DIAGNOSIS — R768 Other specified abnormal immunological findings in serum: Secondary | ICD-10-CM | POA: Diagnosis not present

## 2020-08-07 NOTE — Progress Notes (Signed)
Office Visit Note  Patient: Ruth Wolfe             Date of Birth: 1945-03-10           MRN: [id number]             PCP: Marrian Salvage, [place] Referring: Marrian Salvage,* Visit Date: 08/07/2020   Subjective:  New Patient (Initial Visit) (Patient has previously been seen by Dr. Sander Radon and Dr. Estanislado Pandy and cannot recall exact timeframe or treatment, but it has been quite some time. Patient complains of generalized joint pain- neck, right hip, and right elbow. Dr. Ellene Route had labs drawn revealing abnormalities after neck surgery in December 2021. )   History of Present Illness: Ruth Wolfe is a 75 y.o. female here for evaluation of arthritis and concern for sicca syndrome especially increased symptoms since cervical laminectomy surgery.  She has a very chronic history of sicca symptoms with bilateral dry eyes also longstanding history of osteoarthritis joint pain in multiple sites.  She has previously seen Dr. Joanna Puff and and Dr. Reynolds Bowl for but was not currently following up with any rheumatologist since she never took or was recommended chronic immunosuppressive therapy for her symptoms.  She has used ophthalmic treatments for the dry eyes.  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  She feels some degree of mouth dryness but not a primary complaint.  Besides some increase in these sicca symptoms and generalized pain she is also noticed some increased neuropathy type symptoms with episodic pain and paresthesias in the hands but foot pain sounds more typical burning continuous peripheral neuropathy type pain especially on the bottoms of both feet without any known specific cause at this time.  Laboratory work-up for this was revealing for positive ANA, slightly positive SSB, and an elevated sedimentation rate.  Labs reviewed 11/2019 ANA pos SSB 1.2 dsDNA, SSA, RNP,  Smith ESR 60    Activities of Daily Living:  Patient reports morning stiffness for 10-15 minutes.   Patient Reports nocturnal pain.  Difficulty dressing/grooming: Denies Difficulty climbing stairs: Denies Difficulty getting out of chair: Reports Difficulty using hands for taps, buttons, cutlery, and/or writing: Reports  Review of Systems  Constitutional:  Negative for fatigue.  HENT:  Positive for mouth dryness. Negative for mouth sores and nose dryness.   Eyes:  Positive for itching, visual disturbance and dryness. Negative for pain.  Respiratory:  Negative for cough, hemoptysis, shortness of breath and difficulty breathing.   Cardiovascular:  Negative for chest pain, palpitations and swelling in legs/feet.  Gastrointestinal:  Negative for abdominal pain, blood in stool, constipation and diarrhea.  Endocrine: Negative for increased urination.  Genitourinary:  Negative for painful urination.  Musculoskeletal:  Positive for joint pain, joint pain and morning stiffness. Negative for joint swelling, myalgias, muscle weakness, muscle tenderness and myalgias.  Skin:  Positive for color change and rash. Negative for redness.  Allergic/Immunologic: Negative for susceptible to infections.  Neurological:  Positive for numbness and parasthesias. Negative for dizziness, headaches, memory loss and weakness.  Hematological:  Positive for bruising/bleeding tendency. Negative for swollen glands.  Psychiatric/Behavioral:  Negative for confusion and sleep disturbance.    PMFS History:  Patient Active Problem List   Diagnosis Date Noted   Positive ANA (antinuclear antibody) 08/07/2020   Bilateral dry eyes 08/07/2020   Pain in right elbow 08/07/2020   Peripheral neuropathy 08/07/2020   Bilateral foot  pain 08/07/2020   Disorder of hypoglossal nerve 07/10/2020   Body mass index (BMI) 28.0-28.9, adult 07/02/2020   Elevated blood-pressure reading, without diagnosis of hypertension 12/21/2019    Rheumatoid arthritis ([place]) 12/21/2019   Chronic left shoulder pain 10/12/2019   Pain in joint of right hip 06/08/2019   Cervical radiculopathy 10/30/2015   Difficulty speaking 12/06/2011   Paralysis of vocal cords 12/06/2011    Past Medical History:  Diagnosis Date   Allergy    Arthritis    Asthma    Bruit    Per patient   Environmental allergies    GERD (gastroesophageal reflux disease)    Glaucoma    POAG OU   Hyperlipidemia    Hypertensive retinopathy    OU   Neuropathy    legs and arms   Osteopenia    Paralyzed vocal cords    Per patient   Tongue atrophy    Per patient    Family History  Problem Relation Age of Onset   Hypertension Mother    Lung cancer Father    Glaucoma Father    Gout Father    Hypertension Sister    Liver disease Sister    Colon cancer Neg Hx    Stomach cancer Neg Hx    Past Surgical History:  Procedure Laterality Date   APPENDECTOMY     CATARACT EXTRACTION     CERVICAL SPINE SURGERY  01/30/2020   Per patient   TOTAL ABDOMINAL HYSTERECTOMY  1981   Social History   Social History Narrative   Not on file   Immunization History  Administered Date(s) Administered   Influenza-Unspecified 12/14/2015, 11/29/2017   PFIZER(Purple Top)SARS-COV-2 Vaccination 03/15/2019, 04/05/2019, 11/25/2019   Pneumococcal Polysaccharide-23 08/18/2010, 10/13/2012   Tdap 10/18/2010   Zoster Recombinat (Shingrix) 10/05/2018, 12/18/2018     Objective: Vital Signs: BP 121/71 (BP Location: Left Arm, Patient Position: Sitting, Cuff Size: Normal)   Pulse 74   Ht 5' 1.5" (1.562 m)   Wt 157 lb (71.2 kg)   BMI 29.18 kg/m    Physical Exam HENT:     Right Ear: External ear normal.     Left Ear: External ear normal.     Mouth/Throat:     Mouth: Mucous membranes are moist.     Pharynx: Oropharynx is clear.     Comments: Leftward tongue deviation, normal salivary pooling, no thrush or macroglossia Eyes:     Conjunctiva/sclera: Conjunctivae normal.   Cardiovascular:     Rate and Rhythm: Normal rate and regular rhythm.  Pulmonary:     Effort: Pulmonary effort is normal.     Breath sounds: Normal breath sounds.  Skin:    General: Skin is warm and dry.     Findings: No rash.  Neurological:     Mental Status: She is alert.     Deep Tendon Reflexes: Reflexes normal.  Psychiatric:        Mood and Affect: Mood normal.     Musculoskeletal Exam:  Shoulders full ROM no tenderness or swelling Elbows full ROM, no swelling, tender to pressure around cubital tunnel and medial epicondyle Wrists full ROM positive Phalen negative Tinel test Fingers full ROM Heberden's nodes of PIP and DIP joints bilaterally Knees full ROM no tenderness or swelling Ankles full ROM no tenderness or swelling   Investigation: No additional findings.  Imaging: No results found.  Recent Labs: Lab Results  Component Value Date   WBC 5.6 06/24/2020   HGB 13.4 06/24/2020   PLT  201.0 06/24/2020   NA 141 06/24/2020   K 4.6 06/24/2020   CL 106 06/24/2020   CO2 29 06/24/2020   GLUCOSE 93 06/24/2020   BUN 8 06/24/2020   CREATININE 0.75 06/24/2020   BILITOT 1.1 06/24/2020   ALKPHOS 70 06/24/2020   AST 17 06/24/2020   ALT 18 06/24/2020   PROT 6.4 06/24/2020   ALBUMIN 3.7 06/24/2020   CALCIUM 9.0 06/24/2020    Speciality Comments: No specialty comments available.  Procedures:  No procedures performed Allergies: Sulfa antibiotics   Assessment / Plan:     Visit Diagnoses: Positive ANA (antinuclear antibody) - Plan: Rheumatoid factor, C3 and C4, IgG, IgA, IgM, Sedimentation rate, ANA  Positive ANA history consistent with Sjogren's syndrome but does not have specific systemic clinical criteria outside of her sicca features.  We will check sedimentation rate and ANA again today monitoring disease activity.  Individuals with long-term primary Sjogren's syndrome can be at increased risk of lymphoma development we will screen prognostic markers including  rheumatoid factor, serum complements, and immunoglobulins.  Disorder of hypoglossal nerve  Speech difficulty and voice problem seem more likely attributable to neurologic dysfunction and secondary to inflammatory changes and decreased salivary secretions.  Bilateral dry eyes  Chronic dry eyes without specific complication and she has not noticed significant progression over time during having the symptoms for years and seem responsive to ophthalmic treatment.  Pain in right elbow Bilateral foot pain  Elbow pain is localized around the cubital tunnel and the medial epicondyle there may be some component of nerve impingement so considering symptoms at her feet cannot exclude peripheral sensory neuropathy.  This can be caused by systemic features of Sjogren's syndrome but with low positive antibody testing did not obvious highly active disease activity elsewhere would not take this as a clinical criteria without nerve study or biopsy evaluation.   Orders: Orders Placed This Encounter  Procedures   Rheumatoid factor   C3 and C4   IgG, IgA, IgM   Sedimentation rate   ANA    No orders of the defined types were placed in this encounter.    Follow-Up Instructions: No follow-ups on file.   Collier Salina, MD  Note - This record has been created using [employer].  Chart creation errors have been sought, but may not always  have been located. Such creation errors do not reflect on  the standard of medical care.

## 2020-08-08 ENCOUNTER — Ambulatory Visit (INDEPENDENT_AMBULATORY_CARE_PROVIDER_SITE_OTHER): Payer: Medicare Other

## 2020-08-08 DIAGNOSIS — E538 Deficiency of other specified B group vitamins: Secondary | ICD-10-CM

## 2020-08-08 LAB — RHEUMATOID FACTOR: Rheumatoid fact SerPl-aCnc: <14 IU/mL (ref <14)

## 2020-08-08 LAB — ANA: Anti Nuclear Antibody (ANA): NEGATIVE

## 2020-08-08 LAB — C3 AND C4
C3 Complement: 106 mg/dL (ref 83–193)
C4 Complement: 21 mg/dL (ref 15–57)

## 2020-08-08 LAB — IGG, IGA, IGM
IgG (Immunoglobin G), Serum: 1081 mg/dL (ref 600–1540)
IgM, Serum: 105 mg/dL (ref 50–300)
Immunoglobulin A: 185 mg/dL (ref 70–320)

## 2020-08-08 LAB — SEDIMENTATION RATE: Sed Rate: 6 mm/h (ref 0–30)

## 2020-08-08 MED ORDER — CYANOCOBALAMIN 1000 MCG/ML IJ SOLN
1000.0000 ug | Freq: Once | INTRAMUSCULAR | Status: AC
  Administered 2020-08-08: 11:00:00 1000 ug via INTRAMUSCULAR

## 2020-08-08 NOTE — Progress Notes (Signed)
Pt here for 1st  weekly B12 injection per Jodi Mourning  B12 1052mcg given IM, and pt tolerated injection well.  Next B12 injection scheduled for 08/15/20

## 2020-08-10 NOTE — Progress Notes (Signed)
[facility] [place]  ASSESSMENT / PLAN: 75 y.o. female with bilateral lower extremity plantar discomfort. No evidence of hemodynamically significant peripheral arterial disease on clinical exam or noninvasive testing today. Mild right carotid artery stenosis on duplex today. I reassured her that she does not need intervention for this. Would recommend medical therapy for stenosis (ASA / Statin). Counseled patient to follow-up with her primary care physician for further evaluation.  She can follow-up with me as needed. If primary care would like [place] to follow carotid stenosis, would recommend carotid duplex every two years (next 2024).  CHIEF COMPLAINT: Bilateral foot pain.  HISTORY OF PRESENT ILLNESS: Ruth Wolfe is a 75 y.o. female referred to clinic for evaluation of bilateral foot pain.  She has a rheumatologic work-up: elevated ANA, ? Sjogren's syndrome. Hypoglossal nerve dysfunction. History of neuropathy treated with gabapentin. "throbbing, hot sensation in her feet" present for years.  She presents to the office today with concerns that could be caused by arterial disease.  She reports she is very active.  She can walk greater than 12,000 steps a day.  She has no symptoms typical of ischemic rest pain.  She has no nonhealing wounds.  She does have 2 areas of venous stasis dermatitis over her pretibial skin which has been evaluated by a dermatologist in the past.  Past Medical History:  Diagnosis Date   Allergy    Arthritis    Asthma    Bruit    Per patient   Environmental allergies    GERD (gastroesophageal reflux disease)    Glaucoma    POAG OU   Hyperlipidemia    Hypertensive retinopathy    OU   Neuropathy    legs and arms   Osteopenia    Paralyzed vocal cords    Per patient   Tongue atrophy    Per patient    Past Surgical History:  Procedure Laterality Date   APPENDECTOMY     CATARACT EXTRACTION     CERVICAL SPINE SURGERY  01/30/2020   Per  patient   TOTAL ABDOMINAL HYSTERECTOMY  1981    Family History  Problem Relation Age of Onset   Hypertension Mother    Lung cancer Father    Glaucoma Father    Gout Father    Hypertension Sister    Liver disease Sister    Colon cancer Neg Hx    Stomach cancer Neg Hx     Social History   Socioeconomic History   Marital status: Divorced    Spouse name: Not on file   Number of children: Not on file   Years of education: Not on file   Highest education level: Not on file  Occupational History   Not on file  Tobacco Use   Smoking status: Never   Smokeless tobacco: Never  Vaping Use   Vaping Use: Never used  Substance and Sexual Activity   Alcohol use: Yes    Alcohol/week: 5.0 - 6.0 standard drinks    Types: 5 - 6 Cans of beer per week   Drug use: No   Sexual activity: Not on file  Other Topics Concern   Not on file  Social History Narrative   Not on file   Social Determinants of Health   Financial Resource Strain: Not on file  Food Insecurity: Not on file  Transportation Needs: Not on file  Physical Activity: Not on file  Stress: Not on file  Social Connections: Not on file  Intimate Partner Violence: Not on file    Allergies  Allergen Reactions   Sulfa Antibiotics     Wheezing, swelling face and ears    Current Outpatient Medications  Medication Sig Dispense Refill   baclofen (LIORESAL) 10 MG tablet Take 10 mg by mouth 2 (two) times daily.     Cholecalciferol (VITAMIN D) 2000 UNITS CAPS Take 2,000 Units by mouth daily. Takes 4000 units daily (Patient not taking: Reported on 08/07/2020)     Cyanocobalamin (VITAMIN B-12 IJ) Inject as directed once a week.     fluticasone-salmeterol (ADVAIR) 100-50 MCG/ACT AEPB Inhale into the lungs.     Fluticasone-Salmeterol (ADVAIR) 100-50 MCG/DOSE AEPB Inhale into the lungs. (Patient not taking: Reported on 08/07/2020)     Melatonin 10 MG CAPS Take by mouth.     vitamin B-12 (CYANOCOBALAMIN) 1000 MCG tablet Take 1,000 mcg  by mouth daily.     Vitamin D, Ergocalciferol, (DRISDOL) 1.25 MG (50000 UNIT) CAPS capsule Take 1 capsule (50,000 Units total) by mouth every 7 (seven) days for 12 doses. 12 capsule 0   No current facility-administered medications for this visit.    REVIEW OF SYSTEMS:  [X]  denotes positive finding, [ ]  denotes negative finding Cardiac  Comments:  Chest pain or chest pressure:    Shortness of breath upon exertion:    Short of breath when lying flat:    Irregular heart rhythm:        Vascular    Pain in calf, thigh, or hip brought on by ambulation:    Pain in feet at night that wakes you up from your sleep:     Blood clot in your veins:    Leg swelling:         Pulmonary    Oxygen at home:    Productive cough:     Wheezing:         Neurologic    Sudden weakness in arms or legs:     Sudden numbness in arms or legs:     Sudden onset of difficulty speaking or slurred speech:    Temporary loss of vision in one eye:     Problems with dizziness:         Gastrointestinal    Blood in stool:     Vomited blood:         Genitourinary    Burning when urinating:     Blood in urine:        Psychiatric    Major depression:         Hematologic    Bleeding problems:    Problems with blood clotting too easily:        Skin    Rashes or ulcers: x       Constitutional    Fever or chills:      PHYSICAL EXAM Vitals:   08/12/20 0934 08/12/20 0937  BP: 118/74 123/79  Pulse: 75   Resp: 20   Temp: 98 F (36.7 C)   SpO2: 99%   Weight: 155 lb (70.3 kg)   Height: 5' 1.5" (1.562 m)    Constitutional: wel appearing. no distress. Appears well nourished.  Neurologic: CN intact. no focal findings. no sensory loss. Psychiatric: Mood and affect symmetric and appropriate. Eyes: No icterus. No conjunctival pallor. Ears, nose, throat: mucous membranes moist. Midline trachea.  Cardiac: regular rate and rhythm.  Respiratory:  unlabored. Abdominal:  soft, non-tender, non-distended.   Peripheral vascular: 2+ Dps. Small areas of venous stasis dermatitis  over pretibial skin. Extremity: no edema. no cyanosis. no pallor.  Skin: no gangrene. no ulceration.  Lymphatic: no Stemmer's sign. no palpable lymphadenopathy.  PERTINENT LABORATORY AND RADIOLOGIC DATA  Most recent CBC CBC Latest Ref Rng & Units 06/24/2020 04/04/2015  WBC 4.0 - 10.5 K/uL 5.6 5.7  Hemoglobin 12.0 - 15.0 g/dL 13.4 13.8  Hematocrit 36.0 - 46.0 % 40.4 40.2  Platelets 150.0 - 400.0 K/uL 201.0 -     Most recent CMP CMP Latest Ref Rng & Units 06/24/2020  Glucose 70 - 99 mg/dL 93  BUN 6 - 23 mg/dL 8  Creatinine 0.40 - 1.20 mg/dL 0.75  Sodium 135 - 145 mEq/L 141  Potassium 3.5 - 5.1 mEq/L 4.6  Chloride 96 - 112 mEq/L 106  CO2 19 - 32 mEq/L 29  Calcium 8.4 - 10.5 mg/dL 9.0  Total Protein 6.0 - 8.3 g/dL 6.4  Total Bilirubin 0.2 - 1.2 mg/dL 1.1  Alkaline Phos 39 - 117 U/L 70  AST 0 - 37 U/L 17  ALT 0 - 35 U/L 18   LDL Cholesterol  Date Value Ref Range Status  06/24/2020 139 (H) 0 - 99 mg/dL Final     Vascular Imaging:   Yevonne Aline. Stanford Breed, MD [facility] Phone Number: [phone] 08/10/2020 1:18 PM

## 2020-08-11 ENCOUNTER — Other Ambulatory Visit (HOSPITAL_COMMUNITY): Payer: Self-pay | Admitting: Vascular Surgery

## 2020-08-11 DIAGNOSIS — I6523 Occlusion and stenosis of bilateral carotid arteries: Secondary | ICD-10-CM

## 2020-08-12 ENCOUNTER — Other Ambulatory Visit (HOSPITAL_COMMUNITY): Payer: Self-pay | Admitting: Vascular Surgery

## 2020-08-12 ENCOUNTER — Other Ambulatory Visit: Payer: Self-pay

## 2020-08-12 ENCOUNTER — Ambulatory Visit (HOSPITAL_COMMUNITY)
Admission: RE | Admit: 2020-08-12 | Discharge: 2020-08-12 | Disposition: A | Discharge status: Home / Self Care | Payer: Medicare Other | Source: Ambulatory Visit | Attending: Vascular Surgery | Admitting: Vascular Surgery

## 2020-08-12 ENCOUNTER — Other Ambulatory Visit: Payer: Self-pay | Admitting: Vascular Surgery

## 2020-08-12 ENCOUNTER — Encounter: Payer: Self-pay | Admitting: Vascular Surgery

## 2020-08-12 ENCOUNTER — Ambulatory Visit (INDEPENDENT_AMBULATORY_CARE_PROVIDER_SITE_OTHER): Payer: Medicare Other | Admitting: Vascular Surgery

## 2020-08-12 ENCOUNTER — Ambulatory Visit (INDEPENDENT_AMBULATORY_CARE_PROVIDER_SITE_OTHER)
Admission: RE | Admit: 2020-08-12 | Discharge: 2020-08-12 | Disposition: A | Discharge status: Home / Self Care | Payer: Medicare Other | Source: Ambulatory Visit | Attending: Vascular Surgery | Admitting: Vascular Surgery

## 2020-08-12 VITALS — BP 123/79 | HR 75 | Temp 98.0°F | Resp 20 | Ht 61.5 in | Wt 155.0 lb

## 2020-08-12 DIAGNOSIS — G8929 Other chronic pain: Secondary | ICD-10-CM

## 2020-08-12 DIAGNOSIS — M79605 Pain in left leg: Secondary | ICD-10-CM

## 2020-08-12 DIAGNOSIS — M79604 Pain in right leg: Secondary | ICD-10-CM | POA: Insufficient documentation

## 2020-08-12 DIAGNOSIS — R0989 Other specified symptoms and signs involving the circulatory and respiratory systems: Secondary | ICD-10-CM

## 2020-08-12 DIAGNOSIS — I6521 Occlusion and stenosis of right carotid artery: Secondary | ICD-10-CM

## 2020-08-15 ENCOUNTER — Ambulatory Visit (INDEPENDENT_AMBULATORY_CARE_PROVIDER_SITE_OTHER): Payer: Medicare Other | Admitting: *Deleted

## 2020-08-15 ENCOUNTER — Other Ambulatory Visit: Payer: Self-pay

## 2020-08-15 DIAGNOSIS — E538 Deficiency of other specified B group vitamins: Secondary | ICD-10-CM | POA: Diagnosis not present

## 2020-08-15 MED ORDER — CYANOCOBALAMIN 1000 MCG/ML IJ SOLN
1000.0000 ug | Freq: Once | INTRAMUSCULAR | Status: AC
  Administered 2020-08-15: 10:00:00 1000 ug via INTRAMUSCULAR

## 2020-08-15 NOTE — Progress Notes (Addendum)
Pls cosign for B12 inj../lmb  

## 2020-08-18 NOTE — Progress Notes (Signed)
Patient ID: Ruth Wolfe, female   DOB: [birth year], 75 y.o.   MRN: [id number]  Medical screening examination/treatment/procedure(s) were performed by non-physician practitioner and as supervising physician I was immediately available for consultation/collaboration.  I agree with above. Cathlean Cower, MD

## 2020-08-22 ENCOUNTER — Other Ambulatory Visit: Payer: Self-pay

## 2020-08-22 ENCOUNTER — Ambulatory Visit (INDEPENDENT_AMBULATORY_CARE_PROVIDER_SITE_OTHER): Payer: Medicare Other

## 2020-08-22 DIAGNOSIS — E538 Deficiency of other specified B group vitamins: Secondary | ICD-10-CM | POA: Diagnosis not present

## 2020-08-22 MED ORDER — CYANOCOBALAMIN 1000 MCG/ML IJ SOLN
1000.0000 ug | Freq: Once | INTRAMUSCULAR | Status: AC
  Administered 2020-08-22: 1000 ug via INTRAMUSCULAR

## 2020-08-22 NOTE — Progress Notes (Signed)
Patient came into the office for her vitamin b12 injection. Patient tolerated the injection well.  Marland Kitchen Marland Kitchen

## 2020-08-25 DIAGNOSIS — Z1231 Encounter for screening mammogram for malignant neoplasm of breast: Secondary | ICD-10-CM | POA: Diagnosis not present

## 2020-08-25 LAB — HM MAMMOGRAPHY

## 2020-09-01 DIAGNOSIS — Z20822 Contact with and (suspected) exposure to covid-19: Secondary | ICD-10-CM | POA: Diagnosis not present

## 2020-09-03 NOTE — Progress Notes (Signed)
[facility] Note  09/08/2020     CHIEF COMPLAINT Patient presents for Retina Follow Up   HISTORY OF PRESENT ILLNESS: Ruth Wolfe is a 75 y.o. female who presents to the clinic today for:   HPI     Retina Follow Up   Patient presents with  Other.  In both eyes.  This started months ago.  Severity is moderate.  Duration of 6 months.  Since onset it is stable.  I, the attending physician,  performed the HPI with the patient and updated documentation appropriately.        Comments   75 y/o female pt here for 6 mo f/u for VMT OU.  No change in DVA OU, but NVA OU feels slightly worse.  Denies pain, FOL, floaters.  Soothe prn OU.  No longer on Latanoprost, as pt had iStent during cat sx.      Last edited by Bernarda Caffey, MD on 09/10/2020 11:52 PM.     pt states   Referring physician: Hortencia Pilar, MD [place],  [place] [id number]  HISTORICAL INFORMATION:   Selected notes from the MEDICAL RECORD NUMBER Referred by Dr. Quentin Ore for concern of VMT OU LEE: 03.18.21 Read Drivers) [BCVA: OD: 20/30-- OS: 20/50] Ocular Hx-POAG OU, VMT OU, DES OU, cataracts OU, HTN ret OU PMH-HLD   CURRENT MEDICATIONS: No current outpatient medications on file. (Ophthalmic Drugs)   No current facility-administered medications for this visit. (Ophthalmic Drugs)   Current Outpatient Medications (Other)  Medication Sig   baclofen (LIORESAL) 10 MG tablet Take 10 mg by mouth 2 (two) times daily.   Cholecalciferol (VITAMIN D) 2000 UNITS CAPS Take 2,000 Units by mouth daily. Takes 4000 units daily   Cyanocobalamin (VITAMIN B-12 IJ) Inject as directed once a week.   fluticasone-salmeterol (ADVAIR) 100-50 MCG/ACT AEPB Inhale into the lungs.   Melatonin 10 MG CAPS Take by mouth.   vitamin B-12 (CYANOCOBALAMIN) 1000 MCG tablet Take 1,000 mcg by mouth daily.   Vitamin D, Ergocalciferol, (DRISDOL) 1.25 MG (50000 UNIT) CAPS capsule Take 1 capsule  (50,000 Units total) by mouth every 7 (seven) days for 12 doses.   No current facility-administered medications for this visit. (Other)      REVIEW OF SYSTEMS: ROS   Positive for: Gastrointestinal, Musculoskeletal, Eyes, Respiratory Negative for: Constitutional, Neurological, Skin, Genitourinary, HENT, Endocrine, Cardiovascular, Psychiatric, Allergic/Imm, Heme/Lymph Last edited by Matthew Folks, COA on 09/08/2020  1:35 PM.        ALLERGIES Allergies  Allergen Reactions   Sulfa Antibiotics     Wheezing, swelling face and ears    PAST MEDICAL HISTORY Past Medical History:  Diagnosis Date   Allergy    Arthritis    Asthma    Bruit    Per patient   Environmental allergies    GERD (gastroesophageal reflux disease)    Glaucoma    POAG OU   Hyperlipidemia    Hypertensive retinopathy    OU   Macular degeneration    Neuropathy    legs and arms   Osteopenia    Paralyzed vocal cords    Per patient   Tongue atrophy    Per patient   Past Surgical History:  Procedure Laterality Date   APPENDECTOMY     CATARACT EXTRACTION     CERVICAL SPINE SURGERY  01/30/2020   Per patient   TOTAL ABDOMINAL HYSTERECTOMY  1981    FAMILY HISTORY Family History  Problem Relation Age of Onset   Hypertension Mother    Lung cancer Father    Glaucoma Father    Gout Father    Hypertension Sister    Liver disease Sister    Colon cancer Neg Hx    Stomach cancer Neg Hx     SOCIAL HISTORY Social History   Tobacco Use   Smoking status: Former    Types: Cigarettes   Smokeless tobacco: Never  Vaping Use   Vaping Use: Never used  Substance Use Topics   Alcohol use: Yes    Alcohol/week: 5.0 - 6.0 standard drinks    Types: 5 - 6 Cans of beer per week   Drug use: No         OPHTHALMIC EXAM:  Base Eye Exam     Visual Acuity (Snellen - Linear)       Right Left   Dist cc 20/30 -2 20/40 -2   Dist ph cc NI NI    Correction: Glasses         Tonometry (Tonopen, 1:38  PM)       Right Left   Pressure 16 13         Pupils       Dark Light Shape React APD   Right 3 2 Round Brisk None   Left 3 2 Round Brisk None         Visual Fields (Counting fingers)       Left Right    Full Full         Extraocular Movement       Right Left    Full, Ortho Full, Ortho         Neuro/Psych     Oriented x3: Yes   Mood/Affect: Normal         Dilation     Both eyes: 1.0% Mydriacyl, 2.5% Phenylephrine @ 1:38 PM           Slit Lamp and Fundus Exam     Slit Lamp Exam       Right Left   Lids/Lashes Mild Dermatochalasis - upper lid Mild Dermatochalasis - upper lid   Conjunctiva/Sclera White and quiet White and quiet   Cornea Arcus, 1+ Punctate epithelial erosions, well healed temporal cataract wounds Arcus, 1-2+inferior Punctate epithelial erosions   Anterior Chamber deep and clear, narrow temporal angle, no cell or flare deep and clear, narrow temporal angle   Iris Round and dilated Round and dilated   Lens PC IOL in good position PC IOL in good position   Vitreous Vitreous syneresis Vitreous syneresis         Fundus Exam       Right Left   Disc Tilted disc, +cupping, temporal PPA, Sharp rim Tilted disc, +cupping, temporal PPA, mild Pallor, Sharp rim   C/D Ratio 0.6 0.6   Macula Flat, Blunted foveal reflex, central Cystic changes, Drusen, RPE mottling and clumping, no heme Flat, Blunted foveal reflex, central macular cyst, Drusen, RPE mottling and clumping, no heme   Vessels attenuated, mild tortuousity attenuated, Tortuous   Periphery Attached, peripheral drusen, reticular degeneration, No heme  Attached, peripheral drusen, reticular degeneration, No heme             IMAGING AND PROCEDURES  Imaging and Procedures for _0 @  OCT, Retina - OU - Both Eyes       Right Eye Quality was good. Central Foveal Thickness: 362. Progression has been stable. 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   Left Eye Quality was good. Central Foveal Thickness: 405. Progression has been stable. Findings include abnormal foveal contour, intraretinal fluid, no SRF, retinal drusen , vitreous traction (Stable VMT with persistent cystic changes -- no change from prior).   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 CME - cystoid macular edema. PED - pigment epithelial detachment. IRF - intraretinal fluid. SRF - subretinal fluid. EZ - ellipsoid zone. ERM - epiretinal membrane. ORA - outer retinal atrophy. ORT - outer retinal tubulation. SRHM - subretinal hyper-reflective material            ASSESSMENT/PLAN:    ICD-10-CM   1. Vitreomacular adhesion of both eyes  H43.823     2. Retinal edema  H35.81 OCT, Retina - OU - Both Eyes    3. Intermediate stage nonexudative age-related macular degeneration of both eyes  H35.3132     4. Essential hypertension  I10     5. Hypertensive retinopathy of both eyes  H35.033     6. Pseudophakia, both eyes  Z96.1     7. Primary open angle glaucoma of both eyes, unspecified glaucoma stage  H40.1130     8. Dry eyes  H04.123       1,2. 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 Age related macular degeneration, non-exudative, both eyes  - stable intermediate stage disease  - The incidence, anatomy, and pathology of dry AMD, risk of progression, and the AREDS and AREDS 2 study including smoking risks discussed with patient.  - recommend Amsler grid monitoring  4,5. Hypertensive retinopathy OU  - discussed importance of tight BP control  - monitor  6. Pseudophakia OU  - s/p CE/IOL (OS: 09.01.21, OD: 09.15.21, Dr. Kathlen Mody)  - IOLs in good position, doing well  - monitor  7. POAG OU  - under the expert management of Dr. Kathlen Mody  - IOP today: 16,14  - on latanoprost QHS OU  8. Dry eyes OU  - recommend artificial tears and lubricating ointment as needed   Ophthalmic Meds Ordered this visit:  No orders of the defined types were placed in this encounter.     Return in about 6 months (around 03/11/2021) for f/u VMT OU, DFE, OCT.  This document serves as a record of services personally performed by Gardiner Sleeper, MD, PhD. It was created on their behalf by Leeann Must, [place], an ophthalmic technician. The  creation of this record is the provider's dictation and/or activities during the visit.    Electronically signed by: Leeann Must, COA _0 @ 12:03 AM  Gardiner Sleeper, M.D., Ph.D. Diseases & Surgery of the Retina and [place] 09/08/2020   I have reviewed the above documentation for accuracy and completeness, and I agree with the above. Gardiner Sleeper, M.D., Ph.D. 09/11/20 12:03 AM  Abbreviations: M myopia (nearsighted); A astigmatism; H hyperopia (farsighted); P presbyopia; Mrx spectacle prescription;  CTL contact lenses; OD right eye;  OS left eye; OU both eyes  XT exotropia; ET esotropia; PEK punctate epithelial keratitis; PEE punctate epithelial erosions; DES dry eye syndrome; MGD meibomian gland dysfunction; ATs artificial tears; PFAT's preservative free artificial tears; [place] nuclear sclerotic cataract; PSC posterior subcapsular cataract; ERM epi-retinal membrane; PVD posterior vitreous detachment; RD retinal detachment; DM diabetes mellitus; DR diabetic retinopathy; NPDR non-proliferative diabetic retinopathy; PDR proliferative diabetic retinopathy; CSME clinically significant macular edema; DME diabetic macular edema; dbh dot blot hemorrhages; CWS cotton wool spot; POAG primary open angle glaucoma; C/D cup-to-disc ratio; HVF humphrey visual field; GVF goldmann visual field; OCT optical coherence tomography; IOP intraocular pressure; BRVO Branch retinal vein occlusion; CRVO central retinal vein occlusion; CRAO central retinal artery occlusion; BRAO branch retinal artery occlusion; RT retinal tear; SB scleral buckle; PPV pars plana vitrectomy; VH Vitreous hemorrhage; PRP panretinal laser photocoagulation; IVK intravitreal kenalog; VMT vitreomacular traction; MH Macular hole;  NVD neovascularization of the disc; NVE neovascularization elsewhere; AREDS age related eye disease study; ARMD age related macular degeneration; POAG primary open angle glaucoma; EBMD epithelial/anterior basement membrane dystrophy; ACIOL anterior chamber intraocular lens; IOL intraocular lens; PCIOL posterior chamber intraocular lens; Phaco/IOL phacoemulsification with intraocular lens placement; [place] photorefractive keratectomy; LASIK laser assisted in situ keratomileusis; HTN hypertension; DM diabetes mellitus; COPD chronic obstructive pulmonary disease

## 2020-09-05 ENCOUNTER — Encounter: Payer: Self-pay | Admitting: Family

## 2020-09-05 NOTE — Progress Notes (Signed)
Pts Mammogram has been abstracted.

## 2020-09-08 ENCOUNTER — Encounter (INDEPENDENT_AMBULATORY_CARE_PROVIDER_SITE_OTHER): Payer: Self-pay | Admitting: Ophthalmology

## 2020-09-08 ENCOUNTER — Other Ambulatory Visit: Payer: Self-pay

## 2020-09-08 ENCOUNTER — Ambulatory Visit (INDEPENDENT_AMBULATORY_CARE_PROVIDER_SITE_OTHER): Payer: Medicare Other | Admitting: Ophthalmology

## 2020-09-08 DIAGNOSIS — H401130 Primary open-angle glaucoma, bilateral, stage unspecified: Secondary | ICD-10-CM

## 2020-09-08 DIAGNOSIS — Z961 Presence of intraocular lens: Secondary | ICD-10-CM

## 2020-09-08 DIAGNOSIS — H43823 Vitreomacular adhesion, bilateral: Secondary | ICD-10-CM | POA: Diagnosis not present

## 2020-09-08 DIAGNOSIS — H353132 Nonexudative age-related macular degeneration, bilateral, intermediate dry stage: Secondary | ICD-10-CM | POA: Diagnosis not present

## 2020-09-08 DIAGNOSIS — H35033 Hypertensive retinopathy, bilateral: Secondary | ICD-10-CM

## 2020-09-08 DIAGNOSIS — I10 Essential (primary) hypertension: Secondary | ICD-10-CM

## 2020-09-08 DIAGNOSIS — H04123 Dry eye syndrome of bilateral lacrimal glands: Secondary | ICD-10-CM

## 2020-09-08 DIAGNOSIS — H3581 Retinal edema: Secondary | ICD-10-CM | POA: Diagnosis not present

## 2020-09-10 ENCOUNTER — Encounter (INDEPENDENT_AMBULATORY_CARE_PROVIDER_SITE_OTHER): Payer: Self-pay | Admitting: Ophthalmology

## 2020-09-17 DIAGNOSIS — M5481 Occipital neuralgia: Secondary | ICD-10-CM | POA: Diagnosis not present

## 2020-09-17 DIAGNOSIS — M7918 Myalgia, other site: Secondary | ICD-10-CM | POA: Diagnosis not present

## 2020-09-17 DIAGNOSIS — M542 Cervicalgia: Secondary | ICD-10-CM | POA: Diagnosis not present

## 2020-09-17 DIAGNOSIS — M47812 Spondylosis without myelopathy or radiculopathy, cervical region: Secondary | ICD-10-CM | POA: Diagnosis not present

## 2020-09-19 ENCOUNTER — Encounter: Payer: Self-pay | Admitting: Family

## 2020-09-19 ENCOUNTER — Other Ambulatory Visit: Payer: Self-pay

## 2020-09-19 ENCOUNTER — Ambulatory Visit (INDEPENDENT_AMBULATORY_CARE_PROVIDER_SITE_OTHER): Payer: Medicare Other | Admitting: Family

## 2020-09-19 VITALS — BP 112/72 | HR 96 | Temp 98.4°F | Ht 62.0 in | Wt 154.4 lb

## 2020-09-19 DIAGNOSIS — G629 Polyneuropathy, unspecified: Secondary | ICD-10-CM | POA: Diagnosis not present

## 2020-09-19 DIAGNOSIS — R300 Dysuria: Secondary | ICD-10-CM

## 2020-09-19 DIAGNOSIS — E538 Deficiency of other specified B group vitamins: Secondary | ICD-10-CM | POA: Diagnosis not present

## 2020-09-19 LAB — URINALYSIS, ROUTINE W REFLEX MICROSCOPIC
Bilirubin Urine: NEGATIVE
Ketones, ur: NEGATIVE
Nitrite: NEGATIVE
Specific Gravity, Urine: 1.015 (ref 1.000–1.030)
Total Protein, Urine: NEGATIVE
Urine Glucose: NEGATIVE
Urobilinogen, UA: 0.2 (ref 0.0–1.0)
pH: 5.5 (ref 5.0–8.0)

## 2020-09-19 LAB — VITAMIN B12: Vitamin B-12: 561 pg/mL (ref 211–911)

## 2020-09-19 MED ORDER — GABAPENTIN 100 MG PO CAPS: 100.0000 mg | ORAL_CAPSULE | Freq: Every day | ORAL | 3 refills | Status: DC

## 2020-09-19 NOTE — Progress Notes (Signed)
Ruth Wolfe is a 75 y.o. 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     Cholecalciferol (VITAMIN D3 PO) Take by mouth. 2000iu     fluticasone-salmeterol (ADVAIR) 100-50 MCG/ACT AEPB Inhale into the lungs.     gabapentin (NEURONTIN) 100 MG capsule Take 1 capsule (100 mg total) by mouth at bedtime. 30 capsule 3   Melatonin 10 MG CAPS Take by mouth.     niacin 500 MG tablet Take 500 mg by mouth at bedtime.     vitamin B-12 (CYANOCOBALAMIN) 1000 MCG tablet Take 1,000 mcg by mouth daily. (Patient not taking: Reported on 09/19/2020)     No current facility-administered medications for this visit.    Allergies: Sulfa antibiotics  Past Medical History:  Diagnosis Date   Allergy    Arthritis    Asthma    Bruit    Per patient   Environmental allergies    GERD (gastroesophageal reflux disease)    Glaucoma    POAG OU   Hyperlipidemia    Hypertensive retinopathy    OU   Macular degeneration    Neuropathy    legs and arms   Osteopenia    Paralyzed vocal cords    Per patient   Tongue atrophy    Per patient    Past Surgical History:  Procedure Laterality Date   APPENDECTOMY     CATARACT  EXTRACTION     CERVICAL SPINE SURGERY  01/30/2020   Per patient   TOTAL ABDOMINAL HYSTERECTOMY  1981    Family History  Problem Relation Age of Onset   Hypertension Mother    Lung cancer Father    Glaucoma Father    Gout Father    Hypertension Sister    Liver disease Sister    Colon cancer Neg Hx    Stomach cancer Neg Hx     Social History   Tobacco Use   Smoking status: Former    Types: Cigarettes   Smokeless tobacco: Never  Substance Use Topics   Alcohol use: Yes    Alcohol/week: 5.0 - 6.0 standard drinks    Types: 5 - 6 Cans of beer per week    Subjective:  3 month follow up on chronic care needs; Was found to have B12 deficiency at last OV- notes she "felt great" after injections; still having some neuropathy; has had vascular consult and knows that circulation is okay; discussed start of Gabapentin with vascular specialist and would like to try again;  Notes has been having some pubic pain since taking Baclofen; seemed to aggravate her abdomen and has been having some unexplained pain since that time;     Objective:  Vitals:   09/19/20 1032  BP:  112/72  Pulse: 96  Temp: 98.4 F (36.9 C)  TempSrc: Oral  SpO2: 98%  Weight: 154 lb 6.4 oz (70 kg)  Height: '5\' 2"'$  (1.575 m)    General: Well developed, well nourished, in no acute distress  Skin : Warm and dry.  Head: Normocephalic and atraumatic  Eyes: Sclera and conjunctiva clear; pupils round and reactive to light; extraocular movements intact  Ears: External normal; canals clear; tympanic membranes normal  Oropharynx: Pink, supple. No suspicious lesions  Neck: Supple without thyromegaly, adenopathy  Lungs: Respirations unlabored;  Abdomen: Soft; nontender; nondistended; normoactive bowel sounds; no masses or hepatosplenomegaly  Musculoskeletal: No deformities; no active joint inflammation  Extremities: No edema, cyanosis, clubbing  Vessels: Symmetric bilaterally  Neurologic: Alert and oriented; speech  intact; face symmetrical; moves all extremities well; CNII-XII intact without focal deficit   Assessment:  1. Low vitamin B12 level   2. Dysuria   3. Neuropathy     Plan:  Check B12 level today; follow up to be determined; Check U/A and urine culture; Trial of Gabapentin 100 mg qhs; will determine response to see how to titrate dosage;  This visit occurred during the SARS-CoV-2 public health emergency.  Safety protocols were in place, including screening questions prior to the visit, additional usage of staff PPE, and extensive cleaning of exam room while observing appropriate contact time as indicated for disinfecting solutions.    No follow-ups on file.  Orders Placed This Encounter  Procedures   Urine Culture   B12   Urinalysis    Requested Prescriptions   Signed Prescriptions Disp Refills   gabapentin (NEURONTIN) 100 MG capsule 30 capsule 3    Sig: Take 1 capsule (100 mg total) by mouth at bedtime.

## 2020-09-20 LAB — URINE CULTURE
MICRO NUMBER:: 12152189
SPECIMEN QUALITY:: ADEQUATE

## 2020-09-22 ENCOUNTER — Encounter: Payer: Self-pay | Admitting: Family

## 2020-09-22 NOTE — Progress Notes (Signed)
Abstracted

## 2020-10-06 DIAGNOSIS — Z85828 Personal history of other malignant neoplasm of skin: Secondary | ICD-10-CM | POA: Diagnosis not present

## 2020-10-06 DIAGNOSIS — L570 Actinic keratosis: Secondary | ICD-10-CM | POA: Diagnosis not present

## 2020-10-06 DIAGNOSIS — D692 Other nonthrombocytopenic purpura: Secondary | ICD-10-CM | POA: Diagnosis not present

## 2020-10-06 DIAGNOSIS — L821 Other seborrheic keratosis: Secondary | ICD-10-CM | POA: Diagnosis not present

## 2020-10-06 DIAGNOSIS — I872 Venous insufficiency (chronic) (peripheral): Secondary | ICD-10-CM | POA: Diagnosis not present

## 2020-10-06 DIAGNOSIS — L814 Other melanin hyperpigmentation: Secondary | ICD-10-CM | POA: Diagnosis not present

## 2020-10-06 DIAGNOSIS — L578 Other skin changes due to chronic exposure to nonionizing radiation: Secondary | ICD-10-CM | POA: Diagnosis not present

## 2020-10-06 DIAGNOSIS — D225 Melanocytic nevi of trunk: Secondary | ICD-10-CM | POA: Diagnosis not present

## 2020-10-14 DIAGNOSIS — M47812 Spondylosis without myelopathy or radiculopathy, cervical region: Secondary | ICD-10-CM | POA: Diagnosis not present

## 2020-12-23 DIAGNOSIS — Z23 Encounter for immunization: Secondary | ICD-10-CM | POA: Diagnosis not present

## 2021-01-08 DIAGNOSIS — Z20828 Contact with and (suspected) exposure to other viral communicable diseases: Secondary | ICD-10-CM | POA: Diagnosis not present

## 2021-01-21 ENCOUNTER — Telehealth: Payer: Self-pay | Admitting: Family

## 2021-01-21 MED ORDER — GABAPENTIN 100 MG PO CAPS
100.0000 mg | ORAL_CAPSULE | Freq: Every day | ORAL | 3 refills | Status: DC
Start: 2021-01-21 — End: 2021-03-19

## 2021-01-21 NOTE — Telephone Encounter (Signed)
Pt is completely out, pharmacy was supposed to have called [place] Monday.   Medication: gabapentin (NEURONTIN) 100 MG capsule   Has the patient contacted their pharmacy? Yes.    Preferred Pharmacy:  [id number] [address], [place], [place] [address]  Phone:  [phone]  Fax:  [phone]

## 2021-01-21 NOTE — Telephone Encounter (Signed)
Patient notified that rx has been sent in. 

## 2021-02-12 ENCOUNTER — Other Ambulatory Visit: Payer: Self-pay | Admitting: Obstetrics and Gynecology

## 2021-02-12 DIAGNOSIS — Z01419 Encounter for gynecological examination (general) (routine) without abnormal findings: Secondary | ICD-10-CM

## 2021-02-17 DIAGNOSIS — H401111 Primary open-angle glaucoma, right eye, mild stage: Secondary | ICD-10-CM | POA: Diagnosis not present

## 2021-02-17 DIAGNOSIS — H04123 Dry eye syndrome of bilateral lacrimal glands: Secondary | ICD-10-CM | POA: Diagnosis not present

## 2021-02-17 DIAGNOSIS — H401122 Primary open-angle glaucoma, left eye, moderate stage: Secondary | ICD-10-CM | POA: Diagnosis not present

## 2021-02-17 DIAGNOSIS — H43823 Vitreomacular adhesion, bilateral: Secondary | ICD-10-CM | POA: Diagnosis not present

## 2021-03-03 ENCOUNTER — Ambulatory Visit (INDEPENDENT_AMBULATORY_CARE_PROVIDER_SITE_OTHER): Payer: Medicare Other | Admitting: Family

## 2021-03-03 ENCOUNTER — Encounter: Payer: Self-pay | Admitting: Family

## 2021-03-03 VITALS — BP 110/78 | HR 88 | Temp 98.0°F | Ht 61.0 in | Wt 158.0 lb

## 2021-03-03 DIAGNOSIS — E538 Deficiency of other specified B group vitamins: Secondary | ICD-10-CM | POA: Diagnosis not present

## 2021-03-03 DIAGNOSIS — Z7184 Encounter for health counseling related to travel: Secondary | ICD-10-CM

## 2021-03-03 DIAGNOSIS — K219 Gastro-esophageal reflux disease without esophagitis: Secondary | ICD-10-CM

## 2021-03-03 DIAGNOSIS — H6123 Impacted cerumen, bilateral: Secondary | ICD-10-CM | POA: Diagnosis not present

## 2021-03-03 DIAGNOSIS — Z23 Encounter for immunization: Secondary | ICD-10-CM

## 2021-03-03 DIAGNOSIS — R7989 Other specified abnormal findings of blood chemistry: Secondary | ICD-10-CM

## 2021-03-03 LAB — VITAMIN B12: Vitamin B-12: 601 pg/mL (ref 211–911)

## 2021-03-03 NOTE — Progress Notes (Signed)
Ruth Wolfe is a 76 y.o. 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 4000iu     fluticasone-salmeterol (ADVAIR) 100-50 MCG/ACT AEPB Inhale into the lungs.     gabapentin (NEURONTIN) 100 MG capsule Take 1 capsule (100 mg total) by mouth at bedtime. 30 capsule 3   vitamin B-12 (CYANOCOBALAMIN) 1000 MCG tablet Take 1,000 mcg by mouth daily.     No current facility-administered medications for this visit.    Allergies: Sulfa antibiotics  Past Medical History:  Diagnosis Date   Allergy    Arthritis    Asthma    Bruit    Per patient   Environmental allergies    GERD (gastroesophageal reflux disease)    Glaucoma    POAG OU   Hyperlipidemia    Hypertensive retinopathy    OU   Macular degeneration    Neuropathy    legs and arms   Osteopenia    Paralyzed vocal cords    Per patient   Tongue atrophy    Per patient    Past Surgical History:  Procedure Laterality Date   APPENDECTOMY     CATARACT EXTRACTION     CERVICAL SPINE SURGERY  01/30/2020   Per patient   TOTAL ABDOMINAL HYSTERECTOMY  1981    Family History  Problem Relation Age of Onset   Hypertension  Mother    Lung cancer Father    Glaucoma Father    Gout Father    Hypertension Sister    Liver disease Sister    Colon cancer Neg Hx    Stomach cancer Neg Hx     Social History   Tobacco Use   Smoking status: Former    Types: Cigarettes   Smokeless tobacco: Never  Substance Use Topics   Alcohol use: Yes    Alcohol/week: 5.0 - 6.0 standard drinks    Types: 5 - 6 Cans of beer per week    Subjective:   6 month follow up to re-check B12 level; feeling "good" with OTC regimen;  Feels that both ears need to be flushed- hearing is down; prone to cerumen impactions;  Is up to date on flu shot; would be agreeable to completing pneumonia series; Considering to go to [place] next year- worried about travel requirements;     Objective:  Vitals:   03/03/21 0921  BP: 110/78  Pulse: 88  Temp: 98 F (36.7 C)  TempSrc: Oral  SpO2: 96%  Weight: 158 lb (71.7 kg)  Height: 5\' 1"  (1.549 m)    General: Well developed, well nourished, in no acute distress  Skin : Warm and dry.  Head: Normocephalic and atraumatic  Eyes: Sclera and conjunctiva clear; pupils round and reactive to light;  extraocular movements intact  Ears: External normal; cerumen impaction;  Oropharynx: Pink, supple. No suspicious lesions  Neck: Supple without thyromegaly, adenopathy  Lungs: Respirations unlabored; clear to auscultation bilaterally without wheeze, rales, rhonchi  Neurologic: Alert and oriented; speech intact; face symmetrical; moves all extremities well; CNII-XII intact without focal deficit   Assessment:  1. Low vitamin B12 level   2. Gastroesophageal reflux disease, unspecified whether esophagitis present   3. Bilateral impacted cerumen   4. Travel advice encounter     Plan:  Update B12 level today; good response to treatment;  Defers medication; food choices reviewed with patient today; Limited benefit with ear lavage in office today- encouraged to use Debrox and then re-attempt lavage next  week; Information given regarding [facility];  Prevnar 20 updated today;   This visit occurred during the SARS-CoV-2 public health emergency.  Safety protocols were in place, including screening questions prior to the visit, additional usage of staff PPE, and extensive cleaning of exam room while observing appropriate contact time as indicated for disinfecting solutions.    No follow-ups on file.  Orders Placed This Encounter  Procedures   B12    Requested Prescriptions    No prescriptions requested or ordered in this encounter

## 2021-03-03 NOTE — Addendum Note (Signed)
Addended by: Kittie Plater, Karysa Heft HUA on: 03/03/2021 12:18 PM   Modules accepted: Orders

## 2021-03-03 NOTE — Patient Instructions (Addendum)
For your trip to [place]- [email or web address]      Please get your Tdap at the pharmacy; we will finish your Pneumonia vaccines today;     Food Choices for Gastroesophageal Reflux Disease, Adult When you have gastroesophageal reflux disease (GERD), the foods you eat and your eating habits are very important. Choosing the right foods can help ease your discomfort. Think about working with a food expert (dietitian) to help you make good choices. What are tips for following this plan? Reading food labels Look for foods that are low in saturated fat. Foods that may help with your symptoms include: Foods that have less than 5% of daily value (DV) of fat. Foods that have 0 grams of trans fat. Cooking Do not fry your food. Cook your food by baking, steaming, grilling, or broiling. These are all methods that do not need a lot of fat for cooking. To add flavor, try to use herbs that are low in spice and acidity. Meal planning  Choose healthy foods that are low in fat, such as: Fruits and vegetables. Whole grains. Low-fat dairy products. Lean meats, fish, and poultry. Eat small meals often instead of eating 3 large meals each day. Eat your meals slowly in a place where you are relaxed. Avoid bending over or lying down until 2-3 hours after eating. Limit high-fat foods such as fatty meats or fried foods. Limit your intake of fatty foods, such as oils, butter, and shortening. Avoid the following as told by your doctor: Foods that cause symptoms. These may be different for different people. Keep a food diary to keep track of foods that cause symptoms. Alcohol. Drinking a lot of liquid with meals. Eating meals during the 2-3 hours before bed. Lifestyle Stay at a healthy weight. Ask your doctor what weight is healthy for you. If you need to lose weight, work with your doctor to do so safely. Exercise for at least 30 minutes on 5 or more days each week, or as told by  your doctor. Wear loose-fitting clothes. Do not smoke or use any products that contain nicotine or tobacco. If you need help quitting, ask your doctor. Sleep with the head of your bed higher than your feet. Use a wedge under the mattress or blocks under the bed frame to raise the head of the bed. Chew sugar-free gum after meals. What foods should eat? Eat a healthy, well-balanced diet of fruits, vegetables, whole grains, low-fat dairy products, lean meats, fish, and poultry. Each person is different. Foods that may cause symptoms in one person may not cause any symptoms in another person. Work with your doctor to find foods that are safe for you. The items listed above may not be a complete list of what you can eat and drink. Contact a food expert for more options. What foods should I avoid? Limiting some of these foods may help in managing the symptoms of GERD. Everyone is different. Talk with a food expert or your doctor to help you find the exact foods to avoid, if any. Fruits Any fruits prepared with added fat. Any fruits that cause symptoms. For some people, this may include citrus fruits, such as oranges, grapefruit, pineapple, and lemons. Vegetables Deep-fried vegetables. [place] fries. Any vegetables prepared with added fat. Any vegetables that cause symptoms. For some people, this may include tomatoes and tomato products, chili peppers, onions and garlic, and horseradish. Grains Pastries or quick breads with added fat. Meats and other proteins High-fat meats, such as fatty beef or  pork, hot dogs, ribs, ham, sausage, salami, and bacon. Fried meat or protein, including fried fish and fried chicken. Nuts and nut butters, in large amounts. Dairy Whole milk and chocolate milk. Sour cream. Cream. Ice cream. Cream cheese. Milkshakes. Fats and oils Butter. Margarine. Shortening. Ghee. Beverages Coffee and tea, with or without caffeine. Carbonated beverages. Sodas. Energy drinks. Fruit juice  made with acidic fruits, such as orange or grapefruit. Tomato juice. Alcoholic drinks. Sweets and desserts Chocolate and cocoa. Donuts. Seasonings and condiments Pepper. Peppermint and spearmint. Added salt. Any condiments, herbs, or seasonings that cause symptoms. For some people, this may include curry, hot sauce, or vinegar-based salad dressings. The items listed above may not be a complete list of what you should not eat and drink. Contact a food expert for more options. Questions to ask your doctor Diet and lifestyle changes are often the first steps that are taken to manage symptoms of GERD. If diet and lifestyle changes do not help, talk with your doctor about taking medicines. Where to find more information International [employer] for Gastrointestinal Disorders: aboutgerd.org Summary When you have GERD, food and lifestyle choices are very important in easing your symptoms. Eat small meals often instead of 3 large meals a day. Eat your meals slowly and in a place where you are relaxed. Avoid bending over or lying down until 2-3 hours after eating. Limit high-fat foods such as fatty meats or fried foods. This information is not intended to replace advice given to you by your health care provider. Make sure you discuss any questions you have with your health care provider. Document Revised: 08/27/2019 Document Reviewed: 08/27/2019 Elsevier Patient Education  [place].

## 2021-03-10 NOTE — Progress Notes (Signed)
[facility] Note  03/11/2021     CHIEF COMPLAINT Patient presents for Retina Follow Up    HISTORY OF PRESENT ILLNESS: Ruth Wolfe is a 76 y.o. female who presents to the clinic today for:   HPI     Retina Follow Up   Patient presents with  Other.  In both eyes.  This started 6 months ago.  I, the attending physician,  performed the HPI with the patient and updated documentation appropriately.        Comments   Patient here for 6 months retina follow up for VMT OU. Patient states vision about the same. No eye pain. Using Pataday and refresh drops and erythromycin ung BID OU      Last edited by Bernarda Caffey, MD on 03/15/2021  1:15 AM.    Pt states vision is stable, she states when she looks at her ceiling at night, there is a black spot on it, she states it comes and goes and changes sizes   Referring physician: Lisabeth Pick, MD [address] [place],  [place] [address]  HISTORICAL INFORMATION:  Selected notes from the MEDICAL RECORD NUMBER Referred by Dr. Quentin Ore for concern of VMT OU LEE: 03.18.21 Read Drivers) [BCVA: OD: 20/30-- OS: 20/50] Ocular Hx-POAG OU, VMT OU, DES OU, cataracts OU, HTN ret OU PMH-HLD   CURRENT MEDICATIONS: No current outpatient medications on file. (Ophthalmic Drugs)   No current facility-administered medications for this visit. (Ophthalmic Drugs)   Current Outpatient Medications (Other)  Medication Sig   Cholecalciferol (VITAMIN D3 PO) Take by mouth. 4000iu   fluticasone-salmeterol (ADVAIR) 100-50 MCG/ACT AEPB Inhale into the lungs.   gabapentin (NEURONTIN) 100 MG capsule Take 1 capsule (100 mg total) by mouth at bedtime.   vitamin B-12 (CYANOCOBALAMIN) 1000 MCG tablet Take 1,000 mcg by mouth daily.   No current facility-administered medications for this visit. (Other)   REVIEW OF SYSTEMS: ROS   Positive for: Gastrointestinal, Musculoskeletal, Eyes, Respiratory Negative for: Constitutional,  Neurological, Skin, Genitourinary, HENT, Endocrine, Cardiovascular, Psychiatric, Allergic/Imm, Heme/Lymph Last edited by Theodore Demark, COA on 03/11/2021  1:44 PM.     ALLERGIES Allergies  Allergen Reactions   Sulfa Antibiotics     Wheezing, swelling face and ears   PAST MEDICAL HISTORY Past Medical History:  Diagnosis Date   Allergy    Arthritis    Asthma    Bruit    Per patient   Environmental allergies    GERD (gastroesophageal reflux disease)    Glaucoma    POAG OU   Hyperlipidemia    Hypertensive retinopathy    OU   Macular degeneration    Neuropathy    legs and arms   Osteopenia    Paralyzed vocal cords    Per patient   Tongue atrophy    Per patient   Past Surgical History:  Procedure Laterality Date   APPENDECTOMY     CATARACT EXTRACTION     CERVICAL SPINE SURGERY  01/30/2020   Per patient   TOTAL ABDOMINAL HYSTERECTOMY  1981    FAMILY HISTORY Family History  Problem Relation Age of Onset   Hypertension Mother    Lung cancer Father    Glaucoma Father    Gout Father    Hypertension Sister    Liver disease Sister    Colon cancer Neg Hx    Stomach cancer Neg Hx     SOCIAL HISTORY Social History   Tobacco Use  Smoking status: Former    Types: Cigarettes   Smokeless tobacco: Never  Vaping Use   Vaping Use: Never used  Substance Use Topics   Alcohol use: Yes    Alcohol/week: 5.0 - 6.0 standard drinks    Types: 5 - 6 Cans of beer per week   Drug use: No       OPHTHALMIC EXAM:  Base Eye Exam     Visual Acuity (Snellen - Linear)       Right Left   Dist cc 20/30 -2 20/40 -2   Dist ph cc NI NI    Correction: Glasses         Tonometry (Tonopen, 1:42 PM)       Right Left   Pressure 14 13         Pupils       Dark Light Shape React APD   Right 3 2 Round Brisk None   Left 3 2 Round Brisk None         Visual Fields (Counting fingers)       Left Right    Full Full         Extraocular Movement       Right Left     Full, Ortho Full, Ortho         Neuro/Psych     Oriented x3: Yes   Mood/Affect: Normal         Dilation     Both eyes: 1.0% Mydriacyl, 2.5% Phenylephrine @ 1:41 PM           Slit Lamp and Fundus Exam     Slit Lamp Exam       Right Left   Lids/Lashes Mild Dermatochalasis - upper lid Mild Dermatochalasis - upper lid   Conjunctiva/Sclera White and quiet White and quiet   Cornea Arcus, 1+ Punctate epithelial erosions, well healed temporal cataract wounds Arcus, 1+inferior Punctate epithelial erosions, mild tear film debris   Anterior Chamber deep and clear, narrow temporal angle, no cell or flare deep and clear, narrow temporal angle   Iris Round and dilated Round and dilated   Lens PC IOL in good position PC IOL in good position   Anterior Vitreous Vitreous syneresis Vitreous syneresis         Fundus Exam       Right Left   Disc Tilted disc, +cupping, temporal PPA, Sharp rim Tilted disc, +cupping, temporal PPA, mild Pallor, Sharp rim   C/D Ratio 0.6 0.6   Macula Flat, Blunted foveal reflex, central Cystic changes, Drusen, RPE mottling and clumping, no heme Flat, Blunted foveal reflex, central macular cyst, Drusen, RPE mottling and clumping, no heme, central vitelliform like lesion   Vessels attenuated, mild tortuousity attenuated, Tortuous   Periphery Attached, peripheral drusen, reticular degeneration, No heme Attached, peripheral drusen, reticular degeneration, No heme           Refraction     Wearing Rx       Sphere Cylinder Axis Add   Right -0.75 +1.25 009 +2.50   Left -2.25 +2.00 131 +2.50    Type: PAL           IMAGING AND PROCEDURES  Imaging and Procedures for @TODAY @  OCT, Retina - OU - Both Eyes       Right Eye Quality was good. Central Foveal Thickness: 393. Progression has worsened. Findings include abnormal foveal contour, intraretinal fluid, no SRF, vitreous traction, retinal drusen , myopic contour (Persistent VMT with interval  progression  of cystic changes).   Left Eye Quality was good. Central Foveal Thickness: 429. Progression has worsened. Findings include abnormal foveal contour, intraretinal fluid, no SRF, retinal drusen , vitreous traction (Mild interval progression of VMT with increased height of central macular cyst, central SRHM / vitelliform like lesion).   Notes *Images captured and stored on drive  Diagnosis / Impression:  Non-exu ARMD OU VMT with central cystic changes OU (OS > OD) OD: Persistent VMT with interval progression of cystic changes OS: Mild interval progression of VMT with increased height of central macular cyst, central SRHM / vitelliform like lesion  Clinical management:  See below  Abbreviations: NFP - Normal foveal profile. CME - cystoid macular edema. PED - pigment epithelial detachment. IRF - intraretinal fluid. SRF - subretinal fluid. EZ - ellipsoid zone. ERM - epiretinal membrane. ORA - outer retinal atrophy. ORT - outer retinal tubulation. SRHM - subretinal hyper-reflective material            ASSESSMENT/PLAN:    ICD-10-CM   1. Vitreomacular adhesion of both eyes  H43.823 OCT, Retina - OU - Both Eyes    2. Intermediate stage nonexudative age-related macular degeneration of both eyes  H35.3132     3. Essential hypertension  I10     4. Hypertensive retinopathy of both eyes  H35.033     5. Pseudophakia, both eyes  Z96.1     6. Primary open angle glaucoma of both eyes, unspecified glaucoma stage  H40.1130     7. Dry eyes  H04.123      1. VMT with central cystic changes OU (OS>OD)  - BCVA 20/30 OD, 20/40 -- both improved  - asymptomatic, no metamorphopsia  - OCT shows OD: Persistent VMT with interval progression of cystic changes; OS: Mild interval progression of VMT with increased height of central macular cyst, central SRHM / vitelliform like lesion  - discussed possibility of spontaneous release of VMT vs progression to macular hole  - no indication for  surgery at this time and pt wishes to monitor for now -- reasonable  - f/u 6 months for repeat DFE/OCT  2. Age related macular degeneration, non-exudative, both eyes  - stable intermediate stage disease  - The incidence, anatomy, and pathology of dry AMD, risk of progression, and the AREDS and AREDS 2 study including smoking risks discussed with patient.  - recommend Amsler grid monitoring  3,4. Hypertensive retinopathy OU  - discussed importance of tight BP control  - monitor  5. Pseudophakia OU  - s/p CE/IOL (OS: 09.01.21, OD: 09.15.21, Dr. Kathlen Mody)  - IOLs in good position, doing well  - monitor  6. POAG OU  - was under the expert management of Dr. Kathlen Mody  - IOP today: 14,13  - on latanoprost QHS OU  7. Dry eyes OU  - recommend artificial tears and lubricating ointment as needed   Ophthalmic Meds Ordered this visit:  No orders of the defined types were placed in this encounter.    Return in about 6 months (around 09/08/2021) for f/u VMT OU, DFE, OCT.  This document serves as a record of services personally performed by Gardiner Sleeper, MD, PhD. It was created on their behalf by Orvan Falconer, an ophthalmic technician. The creation of this record is the provider's dictation and/or activities during the visit.    Electronically signed by: Orvan Falconer, OA, 03/15/21  1:18 AM  This document serves as a record of services personally performed by Gardiner Sleeper, MD, PhD. It was  created on their behalf by San Jetty. Owens Shark, OA an ophthalmic technician. The creation of this record is the provider's dictation and/or activities during the visit.    Electronically signed by: San Jetty. Owens Shark, [place] 01.11.2023 1:18 AM  Gardiner Sleeper, M.D., Ph.D. Diseases & Surgery of the Retina and Vitreous [facility]  I have reviewed the above documentation for accuracy and completeness, and I agree with the above. Gardiner Sleeper, M.D., Ph.D. 03/15/21 1:21  AM  Abbreviations: M myopia (nearsighted); A astigmatism; H hyperopia (farsighted); P presbyopia; Mrx spectacle prescription;  CTL contact lenses; OD right eye; OS left eye; OU both eyes  XT exotropia; ET esotropia; PEK punctate epithelial keratitis; PEE punctate epithelial erosions; DES dry eye syndrome; MGD meibomian gland dysfunction; ATs artificial tears; PFAT's preservative free artificial tears; [place] nuclear sclerotic cataract; PSC posterior subcapsular cataract; ERM epi-retinal membrane; PVD posterior vitreous detachment; RD retinal detachment; DM diabetes mellitus; DR diabetic retinopathy; NPDR non-proliferative diabetic retinopathy; PDR proliferative diabetic retinopathy; CSME clinically significant macular edema; DME diabetic macular edema; dbh dot blot hemorrhages; CWS cotton wool spot; POAG primary open angle glaucoma; C/D cup-to-disc ratio; HVF humphrey visual field; GVF goldmann visual field; OCT optical coherence tomography; IOP intraocular pressure; BRVO Branch retinal vein occlusion; CRVO central retinal vein occlusion; CRAO central retinal artery occlusion; BRAO branch retinal artery occlusion; RT retinal tear; SB scleral buckle; PPV pars plana vitrectomy; VH Vitreous hemorrhage; PRP panretinal laser photocoagulation; IVK intravitreal kenalog; VMT vitreomacular traction; MH Macular hole;  NVD neovascularization of the disc; NVE neovascularization elsewhere; AREDS age related eye disease study; ARMD age related macular degeneration; POAG primary open angle glaucoma; EBMD epithelial/anterior basement membrane dystrophy; ACIOL anterior chamber intraocular lens; IOL intraocular lens; PCIOL posterior chamber intraocular lens; Phaco/IOL phacoemulsification with intraocular lens placement; [place] photorefractive keratectomy; LASIK laser assisted in situ keratomileusis; HTN hypertension; DM diabetes mellitus; COPD chronic obstructive pulmonary disease

## 2021-03-11 ENCOUNTER — Ambulatory Visit (INDEPENDENT_AMBULATORY_CARE_PROVIDER_SITE_OTHER): Payer: Medicare Other | Admitting: Ophthalmology

## 2021-03-11 ENCOUNTER — Encounter (INDEPENDENT_AMBULATORY_CARE_PROVIDER_SITE_OTHER): Payer: Self-pay | Admitting: Ophthalmology

## 2021-03-11 ENCOUNTER — Other Ambulatory Visit: Payer: Self-pay

## 2021-03-11 DIAGNOSIS — I10 Essential (primary) hypertension: Secondary | ICD-10-CM

## 2021-03-11 DIAGNOSIS — H35033 Hypertensive retinopathy, bilateral: Secondary | ICD-10-CM | POA: Diagnosis not present

## 2021-03-11 DIAGNOSIS — H401130 Primary open-angle glaucoma, bilateral, stage unspecified: Secondary | ICD-10-CM

## 2021-03-11 DIAGNOSIS — H04123 Dry eye syndrome of bilateral lacrimal glands: Secondary | ICD-10-CM | POA: Diagnosis not present

## 2021-03-11 DIAGNOSIS — Z961 Presence of intraocular lens: Secondary | ICD-10-CM

## 2021-03-11 DIAGNOSIS — H353132 Nonexudative age-related macular degeneration, bilateral, intermediate dry stage: Secondary | ICD-10-CM

## 2021-03-11 DIAGNOSIS — H43823 Vitreomacular adhesion, bilateral: Secondary | ICD-10-CM

## 2021-03-15 ENCOUNTER — Encounter (INDEPENDENT_AMBULATORY_CARE_PROVIDER_SITE_OTHER): Payer: Self-pay | Admitting: Ophthalmology

## 2021-03-19 ENCOUNTER — Ambulatory Visit (INDEPENDENT_AMBULATORY_CARE_PROVIDER_SITE_OTHER): Payer: Medicare Other | Admitting: Family

## 2021-03-19 ENCOUNTER — Encounter: Payer: Self-pay | Admitting: Family

## 2021-03-19 VITALS — BP 120/70 | HR 93 | Temp 98.5°F | Ht 62.0 in | Wt 159.8 lb

## 2021-03-19 DIAGNOSIS — H6121 Impacted cerumen, right ear: Secondary | ICD-10-CM

## 2021-03-19 DIAGNOSIS — J45909 Unspecified asthma, uncomplicated: Secondary | ICD-10-CM

## 2021-03-19 DIAGNOSIS — G629 Polyneuropathy, unspecified: Secondary | ICD-10-CM

## 2021-03-19 MED ORDER — GABAPENTIN 100 MG PO CAPS: 100.0000 mg | ORAL_CAPSULE | Freq: Every day | ORAL | 3 refills | Status: DC

## 2021-03-19 MED ORDER — FLUTICASONE-SALMETEROL 250-50 MCG/ACT IN AEPB: 1.0000 | INHALATION_SPRAY | Freq: Two times a day (BID) | RESPIRATORY_TRACT | 3 refills | Status: DC

## 2021-03-19 NOTE — Progress Notes (Signed)
Ruth Wolfe is a 76 y.o. 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 4000iu     vitamin B-12 (CYANOCOBALAMIN) 1000 MCG tablet Take 1,000 mcg by mouth daily.     fluticasone-salmeterol (WIXELA INHUB) 250-50 MCG/ACT AEPB Inhale 1 puff into the lungs in the morning and at bedtime. 3 each 3   gabapentin (NEURONTIN) 100 MG capsule Take 1 capsule (100 mg total) by mouth at bedtime. 90 capsule 3   No current facility-administered medications for this visit.    Allergies: Sulfa antibiotics  Past Medical History:  Diagnosis Date   Allergy    Arthritis    Asthma    Bruit    Per patient   Environmental allergies    GERD (gastroesophageal reflux disease)    Glaucoma    POAG OU   Hyperlipidemia    Hypertensive retinopathy    OU   Macular degeneration    Neuropathy    legs and arms   Osteopenia    Paralyzed vocal cords    Per patient   Tongue atrophy    Per patient    Past Surgical History:  Procedure Laterality Date   APPENDECTOMY     CATARACT EXTRACTION     CERVICAL SPINE SURGERY  01/30/2020   Per patient   TOTAL ABDOMINAL HYSTERECTOMY  1981    Family History   Problem Relation Age of Onset   Hypertension Mother    Lung cancer Father    Glaucoma Father    Gout Father    Hypertension Sister    Liver disease Sister    Colon cancer Neg Hx    Stomach cancer Neg Hx     Social History   Tobacco Use   Smoking status: Former    Types: Cigarettes   Smokeless tobacco: Never  Substance Use Topics   Alcohol use: Yes    Alcohol/week: 5.0 - 6.0 standard drinks    Types: 5 - 6 Cans of beer per week    Subjective:  Completion of ear lavage; has been using Debrox in preparation; Also requesting refills on Gabapentin and Advair;  Planning for CPE in May 2023;    Objective:  Vitals:   03/19/21 0911  BP: 120/70  Pulse: 93  Temp: 98.5 F (36.9 C)  TempSrc: Oral  SpO2: 96%  Weight: 159 lb 12.8 oz (72.5 kg)  Height: 5\' 2"  (1.575 m)    General: Well developed, well nourished, in no acute distress  Skin : Warm and dry.  Head: Normocephalic and atraumatic  Eyes: Sclera and conjunctiva clear; pupils round and reactive to light; extraocular movements intact  Ears: After lavage, external normal; canals clear; tympanic membranes normal  Oropharynx: Pink, supple. No suspicious lesions  Neck: Supple without thyromegaly, adenopathy  Lungs: Respirations unlabored; clear to auscultation bilaterally without wheeze, rales, rhonchi  Neurologic: Alert and oriented; speech intact; face symmetrical; moves all extremities well; CNII-XII intact without focal deficit   Assessment:  1. Impacted cerumen of right ear   2. Neuropathy   3. Uncomplicated asthma, unspecified asthma severity, unspecified whether persistent     Plan:  Ear lavage completed with no difficulty; Refill updated; Refill updated; This visit occurred during the SARS-CoV-2 public health emergency.  Safety protocols were in place, including screening questions prior to the visit, additional usage of staff PPE, and extensive cleaning of exam room while observing appropriate contact time as  indicated for disinfecting solutions.    No follow-ups on file.  No orders of the defined types were placed in this encounter.   Requested Prescriptions   Signed Prescriptions Disp Refills   gabapentin (NEURONTIN) 100 MG capsule 90 capsule 3    Sig: Take 1 capsule (100 mg total) by mouth at bedtime.   fluticasone-salmeterol (WIXELA INHUB) 250-50 MCG/ACT AEPB 3 each 3    Sig: Inhale 1 puff into the lungs in the morning and at bedtime.

## 2021-04-08 DIAGNOSIS — Z20822 Contact with and (suspected) exposure to covid-19: Secondary | ICD-10-CM | POA: Diagnosis not present

## 2021-06-15 ENCOUNTER — Ambulatory Visit (INDEPENDENT_AMBULATORY_CARE_PROVIDER_SITE_OTHER): Payer: Medicare Other | Admitting: Podiatry

## 2021-06-15 VITALS — BP 141/84 | HR 78 | Temp 97.9°F

## 2021-06-15 DIAGNOSIS — G609 Hereditary and idiopathic neuropathy, unspecified: Secondary | ICD-10-CM | POA: Diagnosis not present

## 2021-06-15 DIAGNOSIS — L989 Disorder of the skin and subcutaneous tissue, unspecified: Secondary | ICD-10-CM | POA: Diagnosis not present

## 2021-06-15 DIAGNOSIS — Z20822 Contact with and (suspected) exposure to covid-19: Secondary | ICD-10-CM | POA: Diagnosis not present

## 2021-06-15 NOTE — Patient Instructions (Signed)
You can try fungi-nail on the toenails ?

## 2021-06-15 NOTE — Progress Notes (Signed)
Subjective:  ? ?Patient ID: Ruth Wolfe, female   DOB: 76 y.o.   MRN: [id number]  ? ?HPI ?76 year old female presents the office today for concerns of painful skin lesions which been ongoing for quite some time on the right foot.  Denies any swelling or redness or any drainage.  No recent treatment.  She states that she was nervous to get a pedicure as the skin started peeling. ? ?Also she describes secondary concerns of burning, tingling to her feet.  She has been on gabapentin 100 mg daily which has not been helpful.  She previously been diagnosed with neuropathy. ? ? ?Review of Systems  ?All other systems reviewed and are negative. ? ?Past Medical History:  ?Diagnosis Date  ? Allergy   ? Arthritis   ? Asthma   ? Bruit   ? Per patient  ? Environmental allergies   ? GERD (gastroesophageal reflux disease)   ? Glaucoma   ? POAG OU  ? Hyperlipidemia   ? Hypertensive retinopathy   ? OU  ? Macular degeneration   ? Neuropathy   ? legs and arms  ? Osteopenia   ? Paralyzed vocal cords   ? Per patient  ? Tongue atrophy   ? Per patient  ? ? ?Past Surgical History:  ?Procedure Laterality Date  ? APPENDECTOMY    ? CATARACT EXTRACTION    ? CERVICAL SPINE SURGERY  01/30/2020  ? Per patient  ? TOTAL ABDOMINAL HYSTERECTOMY  1981  ? ? ? ?Current Outpatient Medications:  ?  Cholecalciferol (VITAMIN D3 PO), Take by mouth. 4000iu, Disp: , Rfl:  ?  fluticasone-salmeterol (WIXELA INHUB) 250-50 MCG/ACT AEPB, Inhale 1 puff into the lungs in the morning and at bedtime., Disp: 3 each, Rfl: 3 ?  gabapentin (NEURONTIN) 100 MG capsule, Take 1 capsule (100 mg total) by mouth at bedtime., Disp: 90 capsule, Rfl: 3 ?  vitamin B-12 (CYANOCOBALAMIN) 1000 MCG tablet, Take 1,000 mcg by mouth daily., Disp: , Rfl:  ? ?Allergies  ?Allergen Reactions  ? Sulfa Antibiotics   ?  Wheezing, swelling face and ears  ? ? ? ? ? ?   ?Objective:  ?Physical Exam  ?General: AAO x3, NAD ? ?Dermatological: Hyperkeratotic lesion submetatarsal 1 as well as medial hallux  bilaterally.  No ongoing ulceration drainage or any signs of infection. ? ? ? ? ?Vascular: Dorsalis Pedis artery and Posterior Tibial artery pedal pulses are 2/4 bilateral with immedate capillary fill time. There is no pain with calf compression, swelling, warmth, erythema.  ? ?Neruologic: Sensation intact with Semmes Weinstein monofilament. ? ?Musculoskeletal: No gross boney pedal deformities bilateral. No pain, crepitus, or limitation noted with foot and ankle range of motion bilateral. Muscular strength 5/5 in all groups tested bilateral. ? ?Gait: Unassisted, Nonantalgic.  ? ? ?   ?Assessment:  ? ?Skin lesions, neuropathy ? ?   ?Plan:  ?-Treatment options discussed including all alternatives, risks, and complications ?-Etiology of symptoms were discussed ?-I also noticed a debrided the calluses without any complications or bleeding today.  Recommend moisturizer daily.  ABN was signed today.  Should she need to have further debridement in the future likely would not be covered by insurance. ?-Discussed increasing gabapentin to 100 mg daily.  Discussed side effects of medication. ? ?Trula Slade DPM ? ?   ? ?

## 2021-06-16 ENCOUNTER — Ambulatory Visit (INDEPENDENT_AMBULATORY_CARE_PROVIDER_SITE_OTHER): Payer: Medicare Other | Admitting: Family

## 2021-06-16 VITALS — BP 140/88 | HR 96 | Temp 98.5°F | Ht 62.0 in | Wt 160.0 lb

## 2021-06-16 DIAGNOSIS — G8929 Other chronic pain: Secondary | ICD-10-CM | POA: Diagnosis not present

## 2021-06-16 DIAGNOSIS — M542 Cervicalgia: Secondary | ICD-10-CM | POA: Diagnosis not present

## 2021-06-16 DIAGNOSIS — G629 Polyneuropathy, unspecified: Secondary | ICD-10-CM

## 2021-06-16 MED ORDER — KETOROLAC TROMETHAMINE 30 MG/ML IJ SOLN
30.0000 mg | Freq: Once | INTRAMUSCULAR | Status: AC
  Administered 2021-06-16: 30 mg via INTRAMUSCULAR

## 2021-06-16 MED ORDER — METHYLPREDNISOLONE ACETATE 40 MG/ML IJ SUSP
40.0000 mg | Freq: Once | INTRAMUSCULAR | Status: AC
  Administered 2021-06-16: 40 mg via INTRAMUSCULAR

## 2021-06-16 NOTE — Patient Instructions (Signed)
Please try taking 2 tablets of Gabapentin at night x 1 week; then increase to 2 tablets in the am and 2 tablets in the pm.  ?

## 2021-06-16 NOTE — Progress Notes (Signed)
?Ruth Wolfe is a 76 y.o. female with the following history as recorded in [employer]:  ?Patient Active Problem List  ? Diagnosis Date Noted  ? GERD (gastroesophageal reflux disease) 03/03/2021  ? Positive ANA (antinuclear antibody) 08/07/2020  ? Bilateral dry eyes 08/07/2020  ? Pain in right elbow 08/07/2020  ? Peripheral neuropathy 08/07/2020  ? Bilateral foot pain 08/07/2020  ? 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 Body mass index (BMI) 28.0-28.9, adult 07/02/2020  ? Elevated blood-pressure reading, without diagnosis of hypertension 12/21/2019  ? Rheumatoid arthritis ([place]) 12/21/2019  ? Chronic left shoulder pain 10/12/2019  ? Pain in joint of right hip 06/08/2019  ? Cervical radiculopathy 10/30/2015  ? Difficulty speaking 12/06/2011  ? Paralysis of vocal cords 12/06/2011  ?  ?Current Outpatient Medications  ?Medication Sig Dispense Refill  ? Cholecalciferol (VITAMIN D3 PO) Take by mouth. 4000iu    ? fluticasone-salmeterol (WIXELA INHUB) 250-50 MCG/ACT AEPB Inhale 1 puff into the lungs in the morning and at bedtime. 3 each 3  ? gabapentin (NEURONTIN) 100 MG capsule Take 1 capsule (100 mg total) by mouth at bedtime. 90 capsule 3  ? vitamin B-12 (CYANOCOBALAMIN) 1000 MCG tablet Take 1,000 mcg by mouth daily.    ? ?No current facility-administered medications for this visit.  ?  ?Allergies: Sulfa antibiotics  ?Past Medical History:  ?Diagnosis Date  ? Allergy   ? Arthritis   ? Asthma   ? Bruit   ? Per patient  ? Environmental allergies   ? GERD (gastroesophageal reflux disease)   ? Glaucoma   ? POAG OU  ? Hyperlipidemia   ? Hypertensive retinopathy   ? OU  ? Macular degeneration   ? Neuropathy   ? legs and arms  ? Osteopenia   ? Paralyzed vocal cords   ? Per patient  ? Tongue atrophy   ? Per patient  ?  ?Past Surgical History:  ?Procedure Laterality Date  ? APPENDECTOMY    ? CATARACT EXTRACTION    ? CERVICAL SPINE SURGERY  01/30/2020  ? Per patient  ? TOTAL ABDOMINAL HYSTERECTOMY  1981  ?  ?Family History   ?Problem Relation Age of Onset  ? Hypertension Mother   ? Lung cancer Father   ? Glaucoma Father   ? Gout Father   ? Hypertension Sister   ? Liver disease Sister   ? Colon cancer Neg Hx   ? Stomach cancer Neg Hx   ?  ?Social History  ? ?Tobacco Use  ? Smoking status: Former  ?  Types: Cigarettes  ? Smokeless tobacco: Never  ?Substance Use Topics  ? Alcohol use: Yes  ?  Alcohol/week: 5.0 - 6.0 standard drinks  ?  Types: 5 - 6 Cans of beer per week  ?  ?Subjective:  ? ?Patient presents with concerns for chronic neck pain. Symptoms present on and off for past 10+ years; has had treatment with neurosurgery; symptoms re-flared in past 2 weeks; very painful at night when she tries to rest her head on pillow; ?Also concerned that having increased neuropathy- "more numbness/ tingling recently in my feet." Taking Gabapentin 100 mg qhs;  ? ? ? ?Objective:  ?Vitals:  ? 06/16/21 1339  ?BP: 140/88  ?Pulse: 96  ?Temp: 98.5 ?F (36.9 ?C)  ?TempSrc: Oral  ?SpO2: 98%  ?Weight: 160 lb (72.6 kg)  ?Height: '5\' 2"'$  (1.575 m)  ?  ?General: Well developed, well nourished, in no acute distress  ?Skin : Warm and dry.  ?Head: Normocephalic  and atraumatic  ?Eyes: Sclera and conjunctiva clear; pupils round and reactive to light; extraocular movements intact  ?Ears: External normal; canals clear; tympanic membranes normal  ?Oropharynx: Pink, supple. No suspicious lesions  ?Neck: Supple without thyromegaly, adenopathy  ?Lungs: Respirations unlabored;  ?Musculoskeletal: No deformities; no active joint inflammation  ?Extremities: No edema, cyanosis, clubbing  ?Vessels: Symmetric bilaterally  ?Neurologic: Alert and oriented; speech intact; face symmetrical; moves all extremities well; CNII-XII intact without focal deficit  ? ?Assessment:  ?1. Chronic neck pain   ?2. Neuropathy   ?  ?Plan:  ?Toradol IM and Depo-Medrol IM given in office; patient is sensitive to oral prednisone; to consider referral back to neurosurgeon; ?Increase Gabapentin as  directed; will try to increase to 200 mg in the am and 200 mg in the pm for now; follow up to be determined; ? ?This visit occurred during the SARS-CoV-2 public health emergency.  Safety protocols were in place, including screening questions prior to the visit, additional usage of staff PPE, and extensive cleaning of exam room while observing appropriate contact time as indicated for disinfecting solutions.  ? ? ?No follow-ups on file.  ?No orders of the defined types were placed in this encounter. ?  ?Requested Prescriptions  ? ? No prescriptions requested or ordered in this encounter  ?  ? ?

## 2021-07-02 ENCOUNTER — Ambulatory Visit (INDEPENDENT_AMBULATORY_CARE_PROVIDER_SITE_OTHER): Payer: Medicare Other

## 2021-07-02 VITALS — Ht 61.0 in | Wt 160.0 lb

## 2021-07-02 DIAGNOSIS — Z0000 Encounter for general adult medical examination without abnormal findings: Secondary | ICD-10-CM

## 2021-07-02 NOTE — Patient Instructions (Signed)
Ms. Haran , ?Thank you for taking time to come for your Medicare Wellness Visit. I appreciate your ongoing commitment to your health goals. Please review the following plan we discussed and let me know if I can assist you in the future.  ? ?Screening recommendations/referrals: ?Colonoscopy: not required ?Mammogram: scheduled for 08/31/2021 ?Bone Density: completed 02/24/1999 ?Recommended yearly ophthalmology/optometry visit for glaucoma screening and checkup ?Recommended yearly dental visit for hygiene and checkup ? ?Vaccinations: ?Influenza vaccine: due 09/29/2021 ?Pneumococcal vaccine: completed 03/03/2021 ?Tdap vaccine: completed ?Shingles vaccine:  completed   ?Covid-19: 11/19/2020, 06/19/2020, 11/25/2019, 04/05/2019, 03/15/2019 ? ?Advanced directives: Please bring a copy of your POA (Power of Attorney) and/or Living Will to your next appointment.  ? ?Conditions/risks identified: none ? ?Next appointment: Follow up in one year for your annual wellness visit  ? ? ?Preventive Care 22 Years and Older, Female ?Preventive care refers to lifestyle choices and visits with your health care provider that can promote health and wellness. ?What does preventive care include? ?A yearly physical exam. This is also called an annual well check. ?Dental exams once or twice a year. ?Routine eye exams. Ask your health care provider how often you should have your eyes checked. ?Personal lifestyle choices, including: ?Daily care of your teeth and gums. ?Regular physical activity. ?Eating a healthy diet. ?Avoiding tobacco and drug use. ?Limiting alcohol use. ?Practicing safe sex. ?Taking low-dose aspirin every day. ?Taking vitamin and mineral supplements as recommended by your health care provider. ?What happens during an annual well check? ?The services and screenings done by your health care provider during your annual well check will depend on your age, overall health, lifestyle risk factors, and family history of disease. ?Counseling  ?Your  health care provider may ask you questions about your: ?Alcohol use. ?Tobacco use. ?Drug use. ?Emotional well-being. ?Home and relationship well-being. ?Sexual activity. ?Eating habits. ?History of falls. ?Memory and ability to understand (cognition). ?Work and work Statistician. ?Reproductive health. ?Screening  ?You may have the following tests or measurements: ?Height, weight, and BMI. ?Blood pressure. ?Lipid and cholesterol levels. These may be checked every 5 years, or more frequently if you are over 4 years old. ?Skin check. ?Lung cancer screening. You may have this screening every year starting at age 81 if you have a 30-pack-year history of smoking and currently smoke or have quit within the past 15 years. ?Fecal occult blood test (FOBT) of the stool. You may have this test every year starting at age 71. ?Flexible sigmoidoscopy or colonoscopy. You may have a sigmoidoscopy every 5 years or a colonoscopy every 10 years starting at age 84. ?Hepatitis C blood test. ?Hepatitis B blood test. ?Sexually transmitted disease (STD) testing. ?Diabetes screening. This is done by checking your blood sugar (glucose) after you have not eaten for a while (fasting). You may have this done every 1-3 years. ?Bone density scan. This is done to screen for osteoporosis. You may have this done starting at age 48. ?Mammogram. This may be done every 1-2 years. Talk to your health care provider about how often you should have regular mammograms. ?Talk with your health care provider about your test results, treatment options, and if necessary, the need for more tests. ?Vaccines  ?Your health care provider may recommend certain vaccines, such as: ?Influenza vaccine. This is recommended every year. ?Tetanus, diphtheria, and acellular pertussis (Tdap, Td) vaccine. You may need a Td booster every 10 years. ?Zoster vaccine. You may need this after age 67. ?Pneumococcal 13-valent conjugate (PCV13) vaccine. One  dose is recommended after age  64. ?Pneumococcal polysaccharide (PPSV23) vaccine. One dose is recommended after age 65. ?Talk to your health care provider about which screenings and vaccines you need and how often you need them. ?This information is not intended to replace advice given to you by your health care provider. Make sure you discuss any questions you have with your health care provider. ?Document Released: 03/14/2015 Document Revised: 11/05/2015 Document Reviewed: 12/17/2014 ?Elsevier Interactive Patient Education ? 2017 [place]. ? ?Fall Prevention in the Home ?Falls can cause injuries. They can happen to people of all ages. There are many things you can do to make your home safe and to help prevent falls. ?What can I do on the outside of my home? ?Regularly fix the edges of walkways and driveways and fix any cracks. ?Remove anything that might make you trip as you walk through a door, such as a raised step or threshold. ?Trim any bushes or trees on the path to your home. ?Use bright outdoor lighting. ?Clear any walking paths of anything that might make someone trip, such as rocks or tools. ?Regularly check to see if handrails are loose or broken. Make sure that both sides of any steps have handrails. ?Any raised decks and porches should have guardrails on the edges. ?Have any leaves, snow, or ice cleared regularly. ?Use sand or salt on walking paths during winter. ?Clean up any spills in your garage right away. This includes oil or grease spills. ?What can I do in the bathroom? ?Use night lights. ?Install grab bars by the toilet and in the tub and shower. Do not use towel bars as grab bars. ?Use non-skid mats or decals in the tub or shower. ?If you need to sit down in the shower, use a plastic, non-slip stool. ?Keep the floor dry. Clean up any water that spills on the floor as soon as it happens. ?Remove soap buildup in the tub or shower regularly. ?Attach bath mats securely with double-sided non-slip rug tape. ?Do not have throw  rugs and other things on the floor that can make you trip. ?What can I do in the bedroom? ?Use night lights. ?Make sure that you have a light by your bed that is easy to reach. ?Do not use any sheets or blankets that are too big for your bed. They should not hang down onto the floor. ?Have a firm chair that has side arms. You can use this for support while you get dressed. ?Do not have throw rugs and other things on the floor that can make you trip. ?What can I do in the kitchen? ?Clean up any spills right away. ?Avoid walking on wet floors. ?Keep items that you use a lot in easy-to-reach places. ?If you need to reach something above you, use a strong step stool that has a grab bar. ?Keep electrical cords out of the way. ?Do not use floor polish or wax that makes floors slippery. If you must use wax, use non-skid floor wax. ?Do not have throw rugs and other things on the floor that can make you trip. ?What can I do with my stairs? ?Do not leave any items on the stairs. ?Make sure that there are handrails on both sides of the stairs and use them. Fix handrails that are broken or loose. Make sure that handrails are as long as the stairways. ?Check any carpeting to make sure that it is firmly attached to the stairs. Fix any carpet that is loose or worn. ?  Avoid having throw rugs at the top or bottom of the stairs. If you do have throw rugs, attach them to the floor with carpet tape. ?Make sure that you have a light switch at the top of the stairs and the bottom of the stairs. If you do not have them, ask someone to add them for you. ?What else can I do to help prevent falls? ?Wear shoes that: ?Do not have high heels. ?Have rubber bottoms. ?Are comfortable and fit you well. ?Are closed at the toe. Do not wear sandals. ?If you use a stepladder: ?Make sure that it is fully opened. Do not climb a closed stepladder. ?Make sure that both sides of the stepladder are locked into place. ?Ask someone to hold it for you, if  possible. ?Clearly mark and make sure that you can see: ?Any grab bars or handrails. ?First and last steps. ?Where the edge of each step is. ?Use tools that help you move around (mobility aids) if they are n

## 2021-07-02 NOTE — Progress Notes (Signed)
?I connected with [name] today by telephone and verified that I am speaking with the correct person using two identifiers. ?Location patient: home ?Location provider: work ?Persons participating in the virtual visit: Jayanna Kroeger, Glenna Durand LPN. ?  ?I discussed the limitations, risks, security and privacy concerns of performing an evaluation and management service by telephone and the availability of in person appointments. I also discussed with the patient that there may be a patient responsible charge related to this service. The patient expressed understanding and verbally consented to this telephonic visit.  ?  ?Interactive audio and video telecommunications were attempted between this provider and patient, however failed, due to patient having technical difficulties OR patient did not have access to video capability.  We continued and completed visit with audio only. ? ?  ? ?Vital signs may be patient reported or missing. ? ?Subjective:  ? Ruth Wolfe is a 76 y.o. female who presents for Medicare Annual (Subsequent) preventive examination. ? ?Review of Systems    ? ?Cardiac Risk Factors include: advanced age (>74mn, >>27women);obesity (BMI >30kg/m2) ? ?   ?Objective:  ?  ?Today's Vitals  ? 07/02/21 1141 07/02/21 1142  ?Weight: 160 lb (72.6 kg)   ?Height: '5\' 1"'$  (1.549 m)   ?PainSc:  5   ? ?Body mass index is 30.23 kg/m?. ? ? ?  07/02/2021  ? 11:48 AM 11/03/2016  ?  1:45 PM 11/30/2015  ?  9:37 AM  ?Advanced Directives  ?Does Patient Have a Medical Advance Directive? Yes Yes No  ?Type of Advance Directive Living will Living will   ? ? ?Current Medications (verified) ?Outpatient Encounter Medications as of 07/02/2021  ?Medication Sig  ? Cholecalciferol (VITAMIN D3 PO) Take by mouth. 4000iu  ? fluticasone-salmeterol (WIXELA INHUB) 250-50 MCG/ACT AEPB Inhale 1 puff into the lungs in the morning and at bedtime.  ? gabapentin (NEURONTIN) 100 MG capsule Take 1 capsule (100 mg total) by mouth at bedtime.  ? vitamin  B-12 (CYANOCOBALAMIN) 1000 MCG tablet Take 1,000 mcg by mouth daily.  ? ?No facility-administered encounter medications on file as of 07/02/2021.  ? ? ?Allergies (verified) ?Sulfa antibiotics  ? ?History: ?Past Medical History:  ?Diagnosis Date  ? Allergy   ? Arthritis   ? Asthma   ? Bruit   ? Per patient  ? Environmental allergies   ? GERD (gastroesophageal reflux disease)   ? Glaucoma   ? POAG OU  ? Hyperlipidemia   ? Hypertensive retinopathy   ? OU  ? Macular degeneration   ? Neuropathy   ? legs and arms  ? Osteopenia   ? Paralyzed vocal cords   ? Per patient  ? Tongue atrophy   ? Per patient  ? ?Past Surgical History:  ?Procedure Laterality Date  ? APPENDECTOMY    ? CATARACT EXTRACTION    ? CERVICAL SPINE SURGERY  01/30/2020  ? Per patient  ? TOTAL ABDOMINAL HYSTERECTOMY  1981  ? ?Family History  ?Problem Relation Age of Onset  ? Hypertension Mother   ? Lung cancer Father   ? Glaucoma Father   ? Gout Father   ? Hypertension Sister   ? Liver disease Sister   ? Colon cancer Neg Hx   ? Stomach cancer Neg Hx   ? ?Social History  ? ?Socioeconomic History  ? Marital status: Divorced  ?  Spouse name: Not on file  ? Number of children: Not on file  ? Years of education: Not on file  ?  Highest education level: Not on file  ?Occupational History  ? Not on file  ?Tobacco Use  ? Smoking status: Former  ?  Types: Cigarettes  ? Smokeless tobacco: Never  ?Vaping Use  ? Vaping Use: Never used  ?Substance and Sexual Activity  ? Alcohol use: Yes  ?  Alcohol/week: 5.0 - 6.0 standard drinks  ?  Types: 5 - 6 Cans of beer per week  ? Drug use: No  ? Sexual activity: Not on file  ?Other Topics Concern  ? Not on file  ?Social History Narrative  ? Not on file  ? ?Social Determinants of Health  ? ?Financial Resource Strain: Low Risk   ? Difficulty of Paying Living Expenses: Not hard at all  ?Food Insecurity: No Food Insecurity  ? Worried About Charity fundraiser in the Last Year: Never true  ? Ran Out of Food in the Last Year: Never true   ?Transportation Needs: No Transportation Needs  ? Lack of Transportation (Medical): No  ? Lack of Transportation (Non-Medical): No  ?Physical Activity: Sufficiently Active  ? Days of Exercise per Week: 3 days  ? Minutes of Exercise per Session: 50 min  ?Stress: No Stress Concern Present  ? Feeling of Stress : Not at all  ?Social Connections: Not on file  ? ? ?Tobacco Counseling ?Counseling given: Not Answered ? ? ?Clinical Intake: ? ?Pre-visit preparation completed: Yes ? ?Pain : 0-10 ?Pain Score: 5  ?Pain Type: Chronic pain ?Pain Location: Generalized ?Pain Descriptors / Indicators: Aching ?Pain Onset: More than a month ago ?Pain Frequency: Constant ? ?  ? ?Nutritional Status: BMI > 30  Obese ?Nutritional Risks: None ?Diabetes: No ? ?How often do you need to have someone help you when you read instructions, pamphlets, or other written materials from your doctor or pharmacy?: 1 - Never ?What is the last grade level you completed in school?: college ? ?Diabetic? no ? ?Interpreter Needed?: No ? ?Information entered by :: NAllen LPN ? ? ?Activities of Daily Living ? ?  07/02/2021  ? 11:50 AM  ?In your present state of health, do you have any difficulty performing the following activities:  ?Hearing? 0  ?Vision? 0  ?Difficulty concentrating or making decisions? 0  ?Walking or climbing stairs? 0  ?Dressing or bathing? 0  ?Doing errands, shopping? 0  ?Preparing Food and eating ? N  ?Using the Toilet? N  ?In the past six months, have you accidently leaked urine? N  ?Do you have problems with loss of bowel control? N  ?Managing your Medications? N  ?Managing your Finances? N  ?Housekeeping or managing your Housekeeping? N  ? ? ?Patient Care Team: ?Marrian Salvage, FNP as PCP - General (Internal Medicine) ? ?Indicate any recent Medical Services you may have received from other than [facility] providers in the past year (date may be approximate). ? ?   ?Assessment:  ? This is a routine wellness examination for  [facility]. ? ?Hearing/Vision screen ?Vision Screening - Comments:: Regular eye exams, Dr. Scherrie Gerlach, [place] ? ?Dietary issues and exercise activities discussed: ?Current Exercise Habits: Structured exercise class, Type of exercise: strength training/weights, Time (Minutes): 50, Frequency (Times/Week): 3, Weekly Exercise (Minutes/Week): 150 ? ? Goals Addressed   ? ?  ?  ?  ?  ? This Visit's Progress  ?  Patient Stated     ?  07/02/2021, stay alive ?  ? ?  ? ?Depression Screen ? ?  07/02/2021  ? 11:50 AM 06/16/2021  ?  1:43 PM 03/19/2021  ?  9:13 AM 03/03/2021  ?  9:24 AM 09/19/2020  ? 10:35 AM 06/24/2020  ?  9:58 AM 09/16/2015  ?  8:11 AM  ?PHQ 2/9 Scores  ?PHQ - 2 Score 0 0 0 0 0 0 0  ?  ?Fall Risk ? ?  07/02/2021  ? 11:50 AM 06/16/2021  ?  1:42 PM 03/19/2021  ?  9:13 AM 03/03/2021  ?  9:24 AM 09/19/2020  ? 10:35 AM  ?Fall Risk   ?Falls in the past year? 0 0 0 1 0  ?Number falls in past yr: 0 0 0 0 0  ?Injury with Fall? 0 0 0 0 0  ?Risk for fall due to : Medication side effect No Fall Risks No Fall Risks Impaired balance/gait No Fall Risks  ?Follow up Falls evaluation completed;Education provided;Falls prevention discussed Falls evaluation completed Falls evaluation completed Falls evaluation completed;Falls prevention discussed Falls evaluation completed  ? ? ?FALL RISK PREVENTION PERTAINING TO THE HOME: ? ?Any stairs in or around the home? Yes  ?If so, are there any without handrails? No  ?Home free of loose throw rugs in walkways, pet beds, electrical cords, etc? Yes  ?Adequate lighting in your home to reduce risk of falls? Yes  ? ?ASSISTIVE DEVICES UTILIZED TO PREVENT FALLS: ? ?Life alert? No  ?Use of a cane, walker or w/c? No  ?Grab bars in the bathroom? No  ?Shower chair or bench in shower? No  ?Elevated toilet seat or a handicapped toilet? Yes  ? ?TIMED UP AND GO: ? ?Was the test performed? No .  ? ? ? ? ?Cognitive Function: ?  ?  ? ?  07/02/2021  ? 11:52 AM  ?6CIT Screen  ?What Year? 0 points  ?What month? 0 points  ?What  time? 0 points  ?Count back from 20 0 points  ?Months in reverse 0 points  ?Repeat phrase 2 points  ?Total Score 2 points  ? ? ?Immunizations ?Immunization History  ?Administered Date(s) Administered  ? Influenza, Hi

## 2021-07-07 ENCOUNTER — Ambulatory Visit (INDEPENDENT_AMBULATORY_CARE_PROVIDER_SITE_OTHER): Payer: Medicare Other | Admitting: Family

## 2021-07-07 VITALS — BP 122/64 | HR 74 | Temp 98.7°F | Resp 18 | Ht 62.0 in | Wt 158.6 lb

## 2021-07-07 DIAGNOSIS — H6980 Other specified disorders of Eustachian tube, unspecified ear: Secondary | ICD-10-CM

## 2021-07-07 DIAGNOSIS — E538 Deficiency of other specified B group vitamins: Secondary | ICD-10-CM | POA: Diagnosis not present

## 2021-07-07 DIAGNOSIS — E785 Hyperlipidemia, unspecified: Secondary | ICD-10-CM | POA: Diagnosis not present

## 2021-07-07 DIAGNOSIS — R5383 Other fatigue: Secondary | ICD-10-CM | POA: Diagnosis not present

## 2021-07-07 DIAGNOSIS — Z13220 Encounter for screening for lipoid disorders: Secondary | ICD-10-CM

## 2021-07-07 DIAGNOSIS — L989 Disorder of the skin and subcutaneous tissue, unspecified: Secondary | ICD-10-CM | POA: Diagnosis not present

## 2021-07-07 DIAGNOSIS — Z1159 Encounter for screening for other viral diseases: Secondary | ICD-10-CM | POA: Diagnosis not present

## 2021-07-07 LAB — COMPREHENSIVE METABOLIC PANEL
ALT: 16 U/L (ref 0–35)
AST: 18 U/L (ref 0–37)
Albumin: 4.1 g/dL (ref 3.5–5.2)
Alkaline Phosphatase: 72 U/L (ref 39–117)
BUN: 18 mg/dL (ref 6–23)
CO2: 28 mEq/L (ref 19–32)
Calcium: 9.2 mg/dL (ref 8.4–10.5)
Chloride: 100 mEq/L (ref 96–112)
Creatinine, Ser: 0.82 mg/dL (ref 0.40–1.20)
GFR: 68.97 mL/min (ref >60.00)
Glucose, Bld: 90 mg/dL (ref 70–99)
Potassium: 4.2 mEq/L (ref 3.5–5.1)
Sodium: 137 mEq/L (ref 135–145)
Total Bilirubin: 1.1 mg/dL (ref 0.2–1.2)
Total Protein: 6.8 g/dL (ref 6.0–8.3)

## 2021-07-07 LAB — LIPID PANEL
Cholesterol: 251 mg/dL — ABNORMAL HIGH (ref 0–200)
HDL: 92.6 mg/dL (ref >39.00)
LDL Cholesterol: 134 mg/dL — ABNORMAL HIGH (ref 0–99)
NonHDL: 158.75
Total CHOL/HDL Ratio: 3
Triglycerides: 124 mg/dL (ref 0.0–149.0)
VLDL: 24.8 mg/dL (ref 0.0–40.0)

## 2021-07-07 LAB — VITAMIN B12: Vitamin B-12: 526 pg/mL (ref 211–911)

## 2021-07-07 LAB — CBC WITH DIFFERENTIAL/PLATELET
Basophils Absolute: 0 10*3/uL (ref 0.0–0.1)
Basophils Relative: 0.5 % (ref 0.0–3.0)
Eosinophils Absolute: 0.2 10*3/uL (ref 0.0–0.7)
Eosinophils Relative: 2.8 % (ref 0.0–5.0)
HCT: 39.5 % (ref 36.0–46.0)
Hemoglobin: 13 g/dL (ref 12.0–15.0)
Lymphocytes Relative: 25.3 % (ref 12.0–46.0)
Lymphs Abs: 1.7 10*3/uL (ref 0.7–4.0)
MCHC: 32.9 g/dL (ref 30.0–36.0)
MCV: 99.8 fl (ref 78.0–100.0)
Monocytes Absolute: 0.9 10*3/uL (ref 0.1–1.0)
Monocytes Relative: 13 % — ABNORMAL HIGH (ref 3.0–12.0)
Neutro Abs: 3.8 10*3/uL (ref 1.4–7.7)
Neutrophils Relative %: 58.4 % (ref 43.0–77.0)
Platelets: 216 10*3/uL (ref 150.0–400.0)
RBC: 3.96 Mil/uL (ref 3.87–5.11)
RDW: 13.4 % (ref 11.5–15.5)
WBC: 6.6 10*3/uL (ref 4.0–10.5)

## 2021-07-07 MED ORDER — GABAPENTIN 100 MG PO CAPS: ORAL_CAPSULE | ORAL | 1 refills | Status: DC

## 2021-07-07 MED ORDER — FLUTICASONE PROPIONATE 50 MCG/ACT NA SUSP: 2.0000 | Freq: Every day | NASAL | 6 refills | Status: DC

## 2021-07-07 NOTE — Patient Instructions (Signed)
Eustachian Tube Dysfunction  Eustachian tube dysfunction refers to a condition in which a blockage develops in the narrow passage that connects the middle ear to the back of the nose (eustachian tube). The eustachian tube regulates air pressure in the middle ear by letting air move between the ear and nose. It also helps to drain fluid from the middle ear space. Eustachian tube dysfunction can affect one or both ears. When the eustachian tube does not function properly, air pressure, fluid, or both can build up in the middle ear. What are the causes? This condition occurs when the eustachian tube becomes blocked or cannot open normally. Common causes of this condition include: Ear infections. Colds and other infections that affect the nose, mouth, and throat (upper respiratory tract). Allergies. Irritation from cigarette smoke. Irritation from stomach acid coming up into the esophagus (gastroesophageal reflux). The esophagus is the part of the body that moves food from the mouth to the stomach. Sudden changes in air pressure, such as from descending in an airplane or scuba diving. Abnormal growths in the nose or throat, such as: Growths that line the nose (nasal polyps). Abnormal growth of cells (tumors). Enlarged tissue at the back of the throat (adenoids). What increases the risk? You are more likely to develop this condition if: You smoke. You are overweight. You are a child who has: Certain birth defects of the mouth, such as cleft palate. Large tonsils or adenoids. What are the signs or symptoms? Common symptoms of this condition include: A feeling of fullness in the ear. Ear pain. Clicking or popping noises in the ear. Ringing in the ear (tinnitus). Hearing loss. Loss of balance. Dizziness. Symptoms may get worse when the air pressure around you changes, such as when you travel to an area of high elevation, fly on an airplane, or go scuba diving. How is this diagnosed? This  condition may be diagnosed based on: Your symptoms. A physical exam of your ears, nose, and throat. Tests, such as those that measure: The movement of your eardrum. Your hearing (audiometry). How is this treated? Treatment depends on the cause and severity of your condition. In mild cases, you may relieve your symptoms by moving air into your ears. This is called "popping the ears." In more severe cases, or if you have symptoms of fluid in your ears, treatment may include: Medicines to relieve congestion (decongestants). Medicines that treat allergies (antihistamines). Nasal sprays or ear drops that contain medicines that reduce swelling (steroids). A procedure to drain the fluid in your eardrum. In this procedure, a small tube may be placed in the eardrum to: Drain the fluid. Restore the air in the middle ear space. A procedure to insert a balloon device through the nose to inflate the opening of the eustachian tube (balloon dilation). Follow these instructions at home: Lifestyle Do not do any of the following until your health care provider approves: Travel to high altitudes. Fly in airplanes. Work in a pressurized cabin or room. Scuba dive. Do not use any products that contain nicotine or tobacco. These products include cigarettes, chewing tobacco, and vaping devices, such as e-cigarettes. If you need help quitting, ask your health care provider. Keep your ears dry. Wear fitted earplugs during showering and bathing. Dry your ears completely after. General instructions Take over-the-counter and prescription medicines only as told by your health care provider. Use techniques to help pop your ears as recommended by your health care provider. These may include: Chewing gum. Yawning. Frequent, forceful swallowing.   Closing your mouth, holding your nose closed, and gently blowing as if you are trying to blow air out of your nose. Keep all follow-up visits. This is important. Contact a  health care provider if: Your symptoms do not go away after treatment. Your symptoms come back after treatment. You are unable to pop your ears. You have: A fever. Pain in your ear. Pain in your head or neck. Fluid draining from your ear. Your hearing suddenly changes. You become very dizzy. You lose your balance. Get help right away if: You have a sudden, severe increase in any of your symptoms. Summary Eustachian tube dysfunction refers to a condition in which a blockage develops in the eustachian tube. It can be caused by ear infections, allergies, inhaled irritants, or abnormal growths in the nose or throat. Symptoms may include ear pain or fullness, hearing loss, or ringing in the ears. Mild cases are treated with techniques to unblock the ears, such as yawning or chewing gum. More severe cases are treated with medicines or procedures. This information is not intended to replace advice given to you by your health care provider. Make sure you discuss any questions you have with your health care provider. Document Revised: 04/28/2020 Document Reviewed: 04/28/2020 Elsevier Patient Education  2023 [employer] Inc[employer]  

## 2021-07-07 NOTE — Progress Notes (Signed)
?Ruth Wolfe is a 76 y.o. female with the following history as recorded in Epic[facility]:  ?Patient Active Problem List  ? Diagnosis Date Noted  ? GERD (gastroesophageal reflux disease) 03/03/2021  ? Positive ANA (antinuclear antibody) 08/07/2020  ? Bilateral dry eyes 08/07/2020  ? Pain in right elbow 08/07/2020  ? Peripheral neuropathy 08/07/2020  ? Bilateral foot pain 08/07/2020  ? 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 Body mass index (BMI) 28.0-28.9, adult 07/02/2020  ? Elevated blood-pressure reading, without diagnosis of hypertension 12/21/2019  ? Rheumatoid arthritis ([place]) 12/21/2019  ? Chronic left shoulder pain 10/12/2019  ? Pain in joint of right hip 06/08/2019  ? Cervical radiculopathy 10/30/2015  ? Difficulty speaking 12/06/2011  ? Paralysis of vocal cords 12/06/2011  ?  ?Current Outpatient Medications  ?Medication Sig Dispense Refill  ? Cholecalciferol (VITAMIN D3 PO) Take by mouth. 4000iu    ? fluticasone (FLONASE) 50 MCG/ACT nasal spray Place 2 sprays into both nostrils daily. 16 g 6  ? fluticasone-salmeterol (WIXELA INHUB) 250-50 MCG/ACT AEPB Inhale 1 puff into the lungs in the morning and at bedtime. 3 each 3  ? vitamin B-12 (CYANOCOBALAMIN) 1000 MCG tablet Take 1,000 mcg by mouth daily.    ? gabapentin (NEURONTIN) 100 MG capsule Take 2 tablets in the am and take 2 in the pm as directed 360 capsule 1  ? ?No current facility-administered medications for this visit.  ?  ?Allergies: Sulfa antibiotics  ?Past Medical History:  ?Diagnosis Date  ? Allergy   ? Arthritis   ? Asthma   ? Bruit   ? Per patient  ? Environmental allergies   ? GERD (gastroesophageal reflux disease)   ? Glaucoma   ? POAG OU  ? Hyperlipidemia   ? Hypertensive retinopathy   ? OU  ? Macular degeneration   ? Neuropathy   ? legs and arms  ? Osteopenia   ? Paralyzed vocal cords   ? Per patient  ? Tongue atrophy   ? Per patient  ?  ?Past Surgical History:  ?Procedure Laterality Date  ? APPENDECTOMY    ? CATARACT EXTRACTION    ?  CERVICAL SPINE SURGERY  01/30/2020  ? Per patient  ? TOTAL ABDOMINAL HYSTERECTOMY  1981  ?  ?Family History  ?Problem Relation Age of Onset  ? Hypertension Mother   ? Lung cancer Father   ? Glaucoma Father   ? Gout Father   ? Hypertension Sister   ? Liver disease Sister   ? Colon cancer Neg Hx   ? Stomach cancer Neg Hx   ?  ?Social History  ? ?Tobacco Use  ? Smoking status: Former  ?  Types: Cigarettes  ? Smokeless tobacco: Never  ?Substance Use Topics  ? Alcohol use: Yes  ?  Alcohol/week: 5.0 - 6.0 standard drinks  ?  Types: 5 - 6 Cans of beer per week  ?  ?Subjective:  ?Presents for yearly follow up on chronic care needs;  ?Sees dermatologist yearly; mammogram is scheduled for July 2023;  ?Needs directions adjusted for Gabapentin;  ? ? ? ? ?Objective:  ?Vitals:  ? 07/07/21 0933  ?BP: 122/64  ?Pulse: 74  ?Resp: 18  ?Temp: 98.7 ?F (37.1 ?C)  ?TempSrc: Oral  ?SpO2: 98%  ?Weight: 158 lb 9.6 oz (71.9 kg)  ?Height: 5' 2"  (1.575 m)  ?  ?General: Well developed, well nourished, in no acute distress  ?Skin : Warm and dry.  ?Head: Normocephalic and atraumatic  ?Eyes: Sclera and conjunctiva  clear; pupils round and reactive to light; extraocular movements intact  ?Ears: External normal; canals clear; tympanic membranes normal  ?Oropharynx: Pink, supple. No suspicious lesions  ?Neck: Supple without thyromegaly, adenopathy  ?Lungs: Respirations unlabored; clear to auscultation bilaterally without wheeze, rales, rhonchi  ?CVS exam: normal rate and regular rhythm.  ?Abdomen: Soft; nontender; nondistended; normoactive bowel sounds; no masses or hepatosplenomegaly  ?Musculoskeletal: No deformities; no active joint inflammation  ?Extremities: No edema, cyanosis, clubbing  ?Vessels: Symmetric bilaterally  ?Neurologic: Alert and oriented; speech intact; face symmetrical; moves all extremities well; CNII-XII intact without focal deficit  ? ?Assessment:  ?1. Other fatigue   ?2. Lipid screening   ?3. Low vitamin B12 level   ?4. Need for  hepatitis C screening test   ?5. Skin lesion   ?  ?Plan:  ?Age appropriate preventive healthcare needs addressed; encouraged regular eye doctor and dental exams; encouraged regular exercise; will update labs and refills as needed today; follow-up to be determined; ? ? ?No follow-ups on file.  ?Orders Placed This Encounter  ?Procedures  ? CBC with Differential/Platelet  ? Comp Met (CMET)  ? B12  ? Lipid panel  ? Hepatitis C Antibody  ? Ambulatory referral to Dermatology  ?  Referral Priority:   Routine  ?  Referral Type:   Consultation  ?  Referral Reason:   Specialty Services Required  ?  Referred to Provider:   Warren Danes, PA-C  ?  Requested Specialty:   Dermatology  ?  Number of Visits Requested:   1  ?  ?Requested Prescriptions  ? ?Signed Prescriptions Disp Refills  ? gabapentin (NEURONTIN) 100 MG capsule 360 capsule 1  ?  Sig: Take 2 tablets in the am and take 2 in the pm as directed  ? fluticasone (FLONASE) 50 MCG/ACT nasal spray 16 g 6  ?  Sig: Place 2 sprays into both nostrils daily.  ?  ? ?

## 2021-07-08 LAB — HEPATITIS C ANTIBODY
Hepatitis C Ab: NONREACTIVE
SIGNAL TO CUT-OFF: 0.08 (ref <1.00)

## 2021-08-18 DIAGNOSIS — H401111 Primary open-angle glaucoma, right eye, mild stage: Secondary | ICD-10-CM | POA: Diagnosis not present

## 2021-08-18 DIAGNOSIS — Z961 Presence of intraocular lens: Secondary | ICD-10-CM | POA: Diagnosis not present

## 2021-08-18 DIAGNOSIS — H35031 Hypertensive retinopathy, right eye: Secondary | ICD-10-CM | POA: Diagnosis not present

## 2021-08-18 DIAGNOSIS — H43823 Vitreomacular adhesion, bilateral: Secondary | ICD-10-CM | POA: Diagnosis not present

## 2021-08-18 DIAGNOSIS — M3500 Sicca syndrome, unspecified: Secondary | ICD-10-CM | POA: Diagnosis not present

## 2021-08-18 DIAGNOSIS — H524 Presbyopia: Secondary | ICD-10-CM | POA: Diagnosis not present

## 2021-08-18 DIAGNOSIS — H401122 Primary open-angle glaucoma, left eye, moderate stage: Secondary | ICD-10-CM | POA: Diagnosis not present

## 2021-08-18 DIAGNOSIS — H04123 Dry eye syndrome of bilateral lacrimal glands: Secondary | ICD-10-CM | POA: Diagnosis not present

## 2021-08-31 NOTE — Progress Notes (Signed)
[facility]  09/08/2021     CHIEF COMPLAINT Patient presents for Retina Follow Up    HISTORY OF PRESENT ILLNESS: Ruth Wolfe is a 76 y.o. female who presents to the clinic today for:   HPI     Retina Follow Up   Patient presents with  Other (VMA).  In both eyes.  Severity is moderate.  Duration of 6 months.  Since onset it is stable.  I, the attending physician,  performed the HPI with the patient and updated documentation appropriately.        Comments   Patient states vision the same OU. Patient did mention on 07.02.23, saw oval shaped something in vision OD. Shape only stayed in vision a couple of hours. Patient has not seen since. Patient was taken off latanoprost by Dr. Lucianne Lei. Taking pataday as needed for itching and Refresh bid OU. Patient is not taking any AREDS supplements at the present time.       Last edited by Bernarda Caffey, MD on 09/09/2021  8:58 AM.     Patient states that part of her eye came out. She states that her vision is blurry. After two hours the issues was over.    Referring physician: Marrian Salvage, [address],  [place] [id number]  HISTORICAL INFORMATION:  Selected notes from the MEDICAL RECORD NUMBER Referred by Dr. Quentin Ore for concern of VMT OU LEE: 03.18.21 Read Drivers) [BCVA: OD: 20/30-- OS: 20/50] Ocular Hx-POAG OU, VMT OU, DES OU, cataracts OU, HTN ret OU PMH-HLD   CURRENT MEDICATIONS: Current Outpatient Medications (Ophthalmic Drugs)  Medication Sig   latanoprost (XALATAN) 0.005 % ophthalmic solution Place 1 drop into both eyes at bedtime. (Patient not taking: Reported on 09/08/2021)   No current facility-administered medications for this visit. (Ophthalmic Drugs)   Current Outpatient Medications (Other)  Medication Sig   Cholecalciferol (VITAMIN D3 PO) Take by mouth. 4000iu   fluticasone (FLONASE) 50 MCG/ACT nasal spray Place 2 sprays into both nostrils  daily.   fluticasone-salmeterol (WIXELA INHUB) 250-50 MCG/ACT AEPB Inhale 1 puff into the lungs in the morning and at bedtime.   gabapentin (NEURONTIN) 100 MG capsule Take 2 tablets in the am and take 2 in the pm as directed   vitamin B-12 (CYANOCOBALAMIN) 1000 MCG tablet Take 1,000 mcg by mouth daily.   No current facility-administered medications for this visit. (Other)   REVIEW OF SYSTEMS: ROS   Positive for: Gastrointestinal, Musculoskeletal, Eyes, Respiratory Negative for: Constitutional, Neurological, Skin, Genitourinary, HENT, Endocrine, Cardiovascular, Psychiatric, Allergic/Imm, Heme/Lymph Last edited by Roselee Nova D, COT on 09/08/2021  1:26 PM.     ALLERGIES Allergies  Allergen Reactions   Sulfa Antibiotics     Wheezing, swelling face and ears   PAST MEDICAL HISTORY Past Medical History:  Diagnosis Date   Allergy    Arthritis    Asthma    Bruit    Per patient   Environmental allergies    GERD (gastroesophageal reflux disease)    Glaucoma    POAG OU   Hyperlipidemia    Hypertensive retinopathy    OU   Macular degeneration    Neuropathy    legs and arms   Osteopenia    Paralyzed vocal cords    Per patient   Tongue atrophy    Per patient   Past Surgical History:  Procedure Laterality Date   APPENDECTOMY     CATARACT EXTRACTION  CERVICAL SPINE SURGERY  01/30/2020   Per patient   TOTAL ABDOMINAL HYSTERECTOMY  1981   FAMILY HISTORY Family History  Problem Relation Age of Onset   Hypertension Mother    Lung cancer Father    Glaucoma Father    Gout Father    Hypertension Sister    Liver disease Sister    Colon cancer Neg Hx    Stomach cancer Neg Hx    SOCIAL HISTORY Social History   Tobacco Use   Smoking status: Former    Types: Cigarettes   Smokeless tobacco: Never  Vaping Use   Vaping Use: Never used  Substance Use Topics   Alcohol use: Yes    Alcohol/week: 5.0 - 6.0 standard drinks of alcohol    Types: 5 - 6 Cans of beer per week    Drug use: No       OPHTHALMIC EXAM:  Base Eye Exam     Visual Acuity (Snellen - Linear)       Right Left   Dist cc 20/30 -2 20/40 -2   Dist ph cc NI NI    Correction: Glasses         Tonometry (Tonopen, 1:37 PM)       Right Left   Pressure 15 12         Pupils       Dark Light Shape React APD   Right 3 2 Round Brisk None   Left 3 2 Round Brisk None         Visual Fields (Counting fingers)       Left Right    Full Full         Neuro/Psych     Oriented x3: Yes   Mood/Affect: Normal         Dilation     Both eyes: 1.0% Mydriacyl, 2.5% Phenylephrine @ 1:38 PM           Slit Lamp and Fundus Exam     Slit Lamp Exam       Right Left   Lids/Lashes Mild Dermatochalasis - upper lid Mild Dermatochalasis - upper lid   Conjunctiva/Sclera White and quiet White and quiet   Cornea Arcus, trace Punctate epithelial erosions, well healed temporal cataract wounds Arcus, trace inferior Punctate epithelial erosions, mild tear film debris, well healed cataract wounds   Anterior Chamber deep and clear, narrow temporal angle, no cell or flare deep and clear, narrow temporal angle   Iris Round and dilated Round and dilated   Lens PC IOL in good position PC IOL in good position   Anterior Vitreous Vitreous syneresis Vitreous syneresis         Fundus Exam       Right Left   Disc Tilted disc, +cupping, temporal PPA, Sharp rim Tilted disc, +cupping, temporal PPA, mild Pallor, Sharp rim   C/D Ratio 0.6 0.6   Macula Flat, Blunted foveal reflex, central Cystic changes, Drusen, RPE mottling and clumping, no heme Flat, Blunted foveal reflex, central macular cyst, Drusen, RPE mottling and clumping, no heme, central vitelliform like lesion   Vessels attenuated, mild tortuousity, mild AV crossing changes attenuated, Tortuous   Periphery Attached, peripheral drusen, reticular degeneration, No heme Attached, peripheral drusen, reticular degeneration, No heme            Refraction     Wearing Rx       Sphere Cylinder Axis Add   Right -1.00 +1.25 022 +2.50   Left -1.50 +2.50 158 +  2.50    Type: PAL           IMAGING AND PROCEDURES  Imaging and Procedures for @TODAY @  OCT, Retina - OU - Both Eyes       Right Eye Quality was good. Central Foveal Thickness: 385. Progression has been stable. Findings include no SRF, abnormal foveal contour, myopic contour, retinal drusen , intraretinal fluid, vitreous traction (Persistent VMT with stable cystic changes).   Left Eye Quality was good. Central Foveal Thickness: 360. Progression has improved. Findings include no SRF, abnormal foveal contour, retinal drusen , intraretinal fluid, vitreous traction (Interval release of central VMT, with interval improvement in central cystic changes but some cysts still remain, stable vitelliform like lesion, partial PVD).   Notes *Images captured and stored on drive  Diagnosis / Impression:  Non-exu ARMD OU VMT with central cystic changes OU OD: Persistent VMT with stable cystic changes OS: Interval release of central VMT, with interval improvement in central cystic changes but some cysts still remain, stable vitelliform like lesion, partial PVD  Clinical management:  See below  Abbreviations: NFP - Normal foveal profile. CME - cystoid macular edema. PED - pigment epithelial detachment. IRF - intraretinal fluid. SRF - subretinal fluid. EZ - ellipsoid zone. ERM - epiretinal membrane. ORA - outer retinal atrophy. ORT - outer retinal tubulation. SRHM - subretinal hyper-reflective material            ASSESSMENT/PLAN:    ICD-10-CM   1. Vitreomacular adhesion of both eyes  H43.823 OCT, Retina - OU - Both Eyes    2. Intermediate stage nonexudative age-related macular degeneration of both eyes  H35.3132     3. Essential hypertension  I10     4. Hypertensive retinopathy of both eyes  H35.033     5. Pseudophakia, both eyes  Z96.1     6. Primary open angle  glaucoma of both eyes, unspecified glaucoma stage  H40.1130     7. Dry eyes  H04.123      1. VMT with central cystic changes OU  - BCVA 20/30 OD, 20/40 -- stable  - asymptomatic, no metamorphopsia  - OCT shows OD: Persistent VMT with interval progression of cystic changes; OS: Interval release of central VMT, with interval improvement in central cystic changes but some cysts still remain, stable vitelliform form like lesion, partial PVD  - discussed possibility of spontaneous release of VMT vs progression to macular hole  - no indication for surgery at this time and pt wishes to monitor for now -- reasonable  - f/u 6 months, sooner prn - repeat DFE/OCT  2. Age related macular degeneration, non-exudative, both eyes  - stable intermediate stage disease  - The incidence, anatomy, and pathology of dry AMD, risk of progression, and the AREDS and AREDS 2 study including smoking risks discussed with patient. Patient was instructed to continue taking AREDS despite her not liking to take pills.    - recommend Amsler grid monitoring  3,4. Hypertensive retinopathy OU  - discussed importance of tight BP control  - monitor  5. Pseudophakia OU  - s/p CE/IOL (OS: 09.01.21, OD: 09.15.21, Dr. Kathlen Mody)  - IOLs in good position, doing well  - monitor  6. POAG OU  - was under the expert management of Dr. Kathlen Mody  - IOP today: 14,13  - on Latanoprost QHS OU  7. Dry eyes OU  - recommend artificial tears and lubricating ointment as needed   Ophthalmic Meds Ordered this visit:  No orders of the  defined types were placed in this encounter.    Return for DFE, OCT.  This document serves as a record of services personally performed by Gardiner Sleeper, MD, PhD. It was created on their behalf by Renaldo Reel, [place] an ophthalmic technician. The creation of this record is the provider's dictation and/or activities during the visit.    Electronically signed by:  Renaldo Reel, COT  08/31/21  10:27  PM  This document serves as a record of services personally performed by Gardiner Sleeper, MD, PhD. It was created on their behalf by Renaldo Reel, [place] an ophthalmic technician. The creation of this record is the provider's dictation and/or activities during the visit.    Electronically signed by:  Renaldo Reel, COT  09/08/21 10:27 PM  Gardiner Sleeper, M.D., Ph.D. Diseases & Surgery of the Retina and Vitreous [facility]  I have reviewed the above documentation for accuracy and completeness, and I agree with the above. Gardiner Sleeper, M.D., Ph.D. 09/10/21 10:28 PM   Abbreviations: M myopia (nearsighted); A astigmatism; H hyperopia (farsighted); P presbyopia; Mrx spectacle prescription;  CTL contact lenses; OD right eye; OS left eye; OU both eyes  XT exotropia; ET esotropia; PEK punctate epithelial keratitis; PEE punctate epithelial erosions; DES dry eye syndrome; MGD meibomian gland dysfunction; ATs artificial tears; PFAT's preservative free artificial tears; [place] nuclear sclerotic cataract; PSC posterior subcapsular cataract; ERM epi-retinal membrane; PVD posterior vitreous detachment; RD retinal detachment; DM diabetes mellitus; DR diabetic retinopathy; NPDR non-proliferative diabetic retinopathy; PDR proliferative diabetic retinopathy; CSME clinically significant macular edema; DME diabetic macular edema; dbh dot blot hemorrhages; CWS cotton wool spot; POAG primary open angle glaucoma; C/D cup-to-disc ratio; HVF humphrey visual field; GVF goldmann visual field; OCT optical coherence tomography; IOP intraocular pressure; BRVO Branch retinal vein occlusion; CRVO central retinal vein occlusion; CRAO central retinal artery occlusion; BRAO branch retinal artery occlusion; RT retinal tear; SB scleral buckle; PPV pars plana vitrectomy; VH Vitreous hemorrhage; PRP panretinal laser photocoagulation; IVK intravitreal kenalog; VMT vitreomacular traction; MH Macular hole;  NVD  neovascularization of the disc; NVE neovascularization elsewhere; AREDS age related eye disease study; ARMD age related macular degeneration; POAG primary open angle glaucoma; EBMD epithelial/anterior basement membrane dystrophy; ACIOL anterior chamber intraocular lens; IOL intraocular lens; PCIOL posterior chamber intraocular lens; Phaco/IOL phacoemulsification with intraocular lens placement; [place] photorefractive keratectomy; LASIK laser assisted in situ keratomileusis; HTN hypertension; DM diabetes mellitus; COPD chronic obstructive pulmonary disease

## 2021-09-08 ENCOUNTER — Encounter (INDEPENDENT_AMBULATORY_CARE_PROVIDER_SITE_OTHER): Payer: Self-pay | Admitting: Ophthalmology

## 2021-09-08 ENCOUNTER — Ambulatory Visit (INDEPENDENT_AMBULATORY_CARE_PROVIDER_SITE_OTHER): Payer: Medicare Other | Admitting: Ophthalmology

## 2021-09-08 DIAGNOSIS — H43823 Vitreomacular adhesion, bilateral: Secondary | ICD-10-CM | POA: Diagnosis not present

## 2021-09-08 DIAGNOSIS — H353132 Nonexudative age-related macular degeneration, bilateral, intermediate dry stage: Secondary | ICD-10-CM

## 2021-09-08 DIAGNOSIS — H35033 Hypertensive retinopathy, bilateral: Secondary | ICD-10-CM

## 2021-09-08 DIAGNOSIS — H401130 Primary open-angle glaucoma, bilateral, stage unspecified: Secondary | ICD-10-CM | POA: Diagnosis not present

## 2021-09-08 DIAGNOSIS — Z961 Presence of intraocular lens: Secondary | ICD-10-CM

## 2021-09-08 DIAGNOSIS — H04123 Dry eye syndrome of bilateral lacrimal glands: Secondary | ICD-10-CM

## 2021-09-08 DIAGNOSIS — I10 Essential (primary) hypertension: Secondary | ICD-10-CM

## 2021-09-09 ENCOUNTER — Encounter (INDEPENDENT_AMBULATORY_CARE_PROVIDER_SITE_OTHER): Payer: Self-pay | Admitting: Ophthalmology

## 2021-09-14 ENCOUNTER — Encounter: Payer: Self-pay | Admitting: Family

## 2021-09-14 DIAGNOSIS — Z1231 Encounter for screening mammogram for malignant neoplasm of breast: Secondary | ICD-10-CM | POA: Diagnosis not present

## 2021-09-17 DIAGNOSIS — R922 Inconclusive mammogram: Secondary | ICD-10-CM | POA: Diagnosis not present

## 2021-09-17 DIAGNOSIS — R928 Other abnormal and inconclusive findings on diagnostic imaging of breast: Secondary | ICD-10-CM | POA: Diagnosis not present

## 2021-09-22 ENCOUNTER — Other Ambulatory Visit: Payer: Self-pay

## 2021-09-22 DIAGNOSIS — C50919 Malignant neoplasm of unspecified site of unspecified female breast: Secondary | ICD-10-CM

## 2021-09-22 DIAGNOSIS — D0512 Intraductal carcinoma in situ of left breast: Secondary | ICD-10-CM | POA: Diagnosis not present

## 2021-09-22 HISTORY — DX: Malignant neoplasm of unspecified site of unspecified female breast: C50.919

## 2021-09-25 ENCOUNTER — Encounter: Payer: Self-pay | Admitting: Family

## 2021-09-25 ENCOUNTER — Telehealth: Payer: Self-pay | Admitting: Hematology and Oncology

## 2021-09-25 NOTE — Telephone Encounter (Signed)
LVM for patient to return call for afternoon clinic appointment for 8/2

## 2021-09-25 NOTE — Telephone Encounter (Signed)
Spoke to patient to confirm afternoon clinic appointment for 8/2, [facility] will send paperwork

## 2021-09-28 ENCOUNTER — Encounter: Payer: Self-pay | Admitting: *Deleted

## 2021-09-28 DIAGNOSIS — D0512 Intraductal carcinoma in situ of left breast: Secondary | ICD-10-CM | POA: Insufficient documentation

## 2021-09-29 NOTE — Progress Notes (Signed)
Radiation Oncology         [phone] ________________________________  Multidisciplinary Breast Oncology Clinic [facility]) Initial Outpatient Consultation  Name: Ruth Wolfe MRN: [id number]  Date: 09/30/2021  DOB: 1945/04/07  ZY:SAYTKZ, Marvis Repress, FNP  Erroll Luna, MD   REFERRING PHYSICIAN: Erroll Luna, MD  DIAGNOSIS: There were no encounter diagnoses.  Stage *** Left Breast, Intermediate grade DCIS, ER*** / PR*** / Her2***, Grade ***   No diagnosis found.  HISTORY OF PRESENT ILLNESS::Ruth Wolfe is a 76 y.o. female who is presenting to the office today for evaluation of her newly diagnosed breast cancer. She is accompanied by ***. She is doing well overall.   She had routine screening mammography on 09/14/21 showing a possible abnormality in the left breast. She underwent unilateral left diagnostic mammography with tomography at [facility] on 09/17/21 showing: amorphous pleomorphic calcifications in the left breast suspicious of malignancy, measuring 1.1 cm.  Biopsy of the upper outer left breast on 09/22/21 showed: intermediate grade DCIS with calcifications measuring 0.2 cm in the greatest dimension. Prognostic indicators significant for: estrogen receptor, ***% {positive or negative} and progesterone receptor, ***% {positive or negative}, both with {include note beside percentage here/strong staining intensity}. Proliferation marker Ki67 at ***%. HER2 {positive or negative}.  Menarche: *** years old Age at first live birth: *** years old GP: *** LMP: *** Contraceptive: *** HRT: ***   The patient was referred today for presentation in the multidisciplinary conference.  Radiology studies and pathology slides were presented there for review and discussion of treatment options.  A consensus was discussed regarding potential next steps.  PREVIOUS RADIATION THERAPY: {EXAM; YES/NO:19492::"No"}  PAST MEDICAL HISTORY:  Past Medical History:  Diagnosis Date   Allergy     Arthritis    Asthma    Bruit    Per patient   Environmental allergies    GERD (gastroesophageal reflux disease)    Glaucoma    POAG OU   Hyperlipidemia    Hypertensive retinopathy    OU   Macular degeneration    Neuropathy    legs and arms   Osteopenia    Paralyzed vocal cords    Per patient   Tongue atrophy    Per patient    PAST SURGICAL HISTORY: Past Surgical History:  Procedure Laterality Date   APPENDECTOMY     CATARACT EXTRACTION     CERVICAL SPINE SURGERY  01/30/2020   Per patient   TOTAL ABDOMINAL HYSTERECTOMY  1981    FAMILY HISTORY:  Family History  Problem Relation Age of Onset   Hypertension Mother    Lung cancer Father    Glaucoma Father    Gout Father    Hypertension Sister    Liver disease Sister    Colon cancer Neg Hx    Stomach cancer Neg Hx     SOCIAL HISTORY:  Social History   Socioeconomic History   Marital status: Divorced    Spouse name: Not on file   Number of children: Not on file   Years of education: Not on file   Highest education level: Not on file  Occupational History   Not on file  Tobacco Use   Smoking status: Former    Types: Cigarettes   Smokeless tobacco: Never  Vaping Use   Vaping Use: Never used  Substance and Sexual Activity   Alcohol use: Yes    Alcohol/week: 5.0 - 6.0 standard drinks of alcohol    Types: 5 - 6 Cans of beer per  week   Drug use: No   Sexual activity: Not on file  Other Topics Concern   Not on file  Social History Narrative   Not on file   Social Determinants of Health   Financial Resource Strain: Low Risk  (07/02/2021)   Overall Financial Resource Strain (CARDIA)    Difficulty of Paying Living Expenses: Not hard at all  Food Insecurity: No Food Insecurity (07/02/2021)   Hunger Vital Sign    Worried About Running Out of Food in the Last Year: Never true    [place] in the Last Year: Never true  Transportation Needs: No Transportation Needs (07/02/2021)   PRAPARE - Armed forces logistics/support/administrative officer (Medical): No    Lack of Transportation (Non-Medical): No  Physical Activity: Sufficiently Active (07/02/2021)   Exercise Vital Sign    Days of Exercise per Week: 3 days    Minutes of Exercise per Session: 50 min  Stress: No Stress Concern Present (07/02/2021)   [place]    Feeling of Stress : Not at all  Social Connections: Not on file    ALLERGIES:  Allergies  Allergen Reactions   Sulfa Antibiotics     Wheezing, swelling face and ears    MEDICATIONS:  Current Outpatient Medications  Medication Sig Dispense Refill   Cholecalciferol (VITAMIN D3 PO) Take by mouth. 4000iu     fluticasone (FLONASE) 50 MCG/ACT nasal spray Place 2 sprays into both nostrils daily. 16 g 6   fluticasone-salmeterol (WIXELA INHUB) 250-50 MCG/ACT AEPB Inhale 1 puff into the lungs in the morning and at bedtime. 3 each 3   gabapentin (NEURONTIN) 100 MG capsule Take 2 tablets in the am and take 2 in the pm as directed 360 capsule 1   latanoprost (XALATAN) 0.005 % ophthalmic solution Place 1 drop into both eyes at bedtime. (Patient not taking: Reported on 09/08/2021)     vitamin B-12 (CYANOCOBALAMIN) 1000 MCG tablet Take 1,000 mcg by mouth daily.     No current facility-administered medications for this encounter.    REVIEW OF SYSTEMS: A 10+ POINT REVIEW OF SYSTEMS WAS OBTAINED including neurology, dermatology, psychiatry, cardiac, respiratory, lymph, extremities, GI, GU, musculoskeletal, constitutional, reproductive, HEENT. On the provided form, she reports ***. She denies *** and any other symptoms.    PHYSICAL EXAM:  vitals were not taken for this visit.  {may need to copy over vitals} Lungs are clear to auscultation bilaterally. Heart has regular rate and rhythm. No palpable cervical, supraclavicular, or axillary adenopathy. Abdomen soft, non-tender, normal bowel sounds. Breast: *** breast with no palpable mass, nipple  discharge, or bleeding. *** breast with ***.   KPS = ***  100 - Normal; no complaints; no evidence of disease. 90   - Able to carry on normal activity; minor signs or symptoms of disease. 80   - Normal activity with effort; some signs or symptoms of disease. 41   - Cares for self; unable to carry on normal activity or to do active work. 60   - Requires occasional assistance, but is able to care for most of his personal needs. 50   - Requires considerable assistance and frequent medical care. 70   - Disabled; requires special care and assistance. 69   - Severely disabled; hospital admission is indicated although death not imminent. 24   - Very sick; hospital admission necessary; active supportive treatment necessary. 10   - Moribund; fatal processes progressing  rapidly. 0     - Dead  Karnofsky DA, Abelmann [place], Craver LS and Burchenal [facility] [phone]) The use of the nitrogen mustards in the palliative treatment of carcinoma: with particular reference to bronchogenic carcinoma Cancer 1 634-56  LABORATORY DATA:  Lab Results  Component Value Date   WBC 6.6 07/07/2021   HGB 13.0 07/07/2021   HCT 39.5 07/07/2021   MCV 99.8 07/07/2021   PLT 216.0 07/07/2021   Lab Results  Component Value Date   NA 137 07/07/2021   K 4.2 07/07/2021   CL 100 07/07/2021   CO2 28 07/07/2021   Lab Results  Component Value Date   ALT 16 07/07/2021   AST 18 07/07/2021   ALKPHOS 72 07/07/2021   BILITOT 1.1 07/07/2021    PULMONARY FUNCTION TEST:   Review Flowsheet        No data to display          RADIOGRAPHY: OCT, Retina - OU - Both Eyes  Result Date: 09/09/2021 Right Eye Quality was good. Central Foveal Thickness: 385. Progression has been stable. Findings include no SRF, abnormal foveal contour, myopic contour, retinal drusen , intraretinal fluid, vitreous traction (Persistent VMT with stable cystic changes). Left Eye Quality was good. Central Foveal Thickness: 360. Progression has improved.  Findings include no SRF, abnormal foveal contour, retinal drusen , intraretinal fluid, vitreous traction (Interval release of central VMT, with interval improvement in central cystic changes but some cysts still remain, stable vitelliform like lesion, partial PVD). Notes *Images captured and stored on drive Diagnosis / Impression: Non-exu ARMD OU VMT with central cystic changes OU OD: Persistent VMT with stable cystic changes OS: Interval release of central VMT, with interval improvement in central cystic changes but some cysts still remain, stable vitelliform like lesion, partial PVD Clinical management: See below Abbreviations: NFP - Normal foveal profile. CME - cystoid macular edema. PED - pigment epithelial detachment. IRF - intraretinal fluid. SRF - subretinal fluid. EZ - ellipsoid zone. ERM - epiretinal membrane. ORA - outer retinal atrophy. ORT - outer retinal tubulation. SRHM - subretinal hyper-reflective material      IMPRESSION: ***   Patient will be a good candidate for breast conservation with radiotherapy to the left breast. We discussed the general course of radiation, potential side effects, and toxicities with radiation and the patient is interested in this approach. ***   PLAN:  ***    ------------------------------------------------  Blair Promise, PhD, MD  This document serves as a record of services personally performed by Gery Pray, MD. It was created on his behalf by Roney Mans, a trained medical scribe. The creation of this record is based on the scribe's personal observations and the provider's statements to them. This document has been checked and approved by the attending provider.

## 2021-09-30 ENCOUNTER — Ambulatory Visit: Payer: Self-pay | Admitting: Surgery

## 2021-09-30 ENCOUNTER — Inpatient Hospital Stay: Payer: Medicare Other | Attending: Hematology and Oncology

## 2021-09-30 ENCOUNTER — Encounter: Payer: Self-pay | Admitting: *Deleted

## 2021-09-30 ENCOUNTER — Ambulatory Visit: Payer: Medicare Other | Admitting: Physical Therapy

## 2021-09-30 ENCOUNTER — Ambulatory Visit
Admission: RE | Admit: 2021-09-30 | Discharge: 2021-09-30 | Disposition: A | Discharge status: Home / Self Care | Payer: Medicare Other | Source: Ambulatory Visit | Attending: Radiation Oncology | Admitting: Radiation Oncology

## 2021-09-30 ENCOUNTER — Telehealth: Payer: Self-pay | Admitting: Genetic Counselor

## 2021-09-30 ENCOUNTER — Encounter: Payer: Self-pay | Admitting: Hematology and Oncology

## 2021-09-30 ENCOUNTER — Inpatient Hospital Stay (HOSPITAL_BASED_OUTPATIENT_CLINIC_OR_DEPARTMENT_OTHER): Payer: Medicare Other | Admitting: Hematology and Oncology

## 2021-09-30 ENCOUNTER — Other Ambulatory Visit: Payer: Self-pay

## 2021-09-30 DIAGNOSIS — Z882 Allergy status to sulfonamides status: Secondary | ICD-10-CM | POA: Diagnosis not present

## 2021-09-30 DIAGNOSIS — Z79899 Other long term (current) drug therapy: Secondary | ICD-10-CM | POA: Insufficient documentation

## 2021-09-30 DIAGNOSIS — K219 Gastro-esophageal reflux disease without esophagitis: Secondary | ICD-10-CM | POA: Insufficient documentation

## 2021-09-30 DIAGNOSIS — J45909 Unspecified asthma, uncomplicated: Secondary | ICD-10-CM | POA: Insufficient documentation

## 2021-09-30 DIAGNOSIS — Z9049 Acquired absence of other specified parts of digestive tract: Secondary | ICD-10-CM | POA: Insufficient documentation

## 2021-09-30 DIAGNOSIS — Z87891 Personal history of nicotine dependence: Secondary | ICD-10-CM | POA: Insufficient documentation

## 2021-09-30 DIAGNOSIS — Z8249 Family history of ischemic heart disease and other diseases of the circulatory system: Secondary | ICD-10-CM

## 2021-09-30 DIAGNOSIS — E785 Hyperlipidemia, unspecified: Secondary | ICD-10-CM | POA: Insufficient documentation

## 2021-09-30 DIAGNOSIS — Z8349 Family history of other endocrine, nutritional and metabolic diseases: Secondary | ICD-10-CM | POA: Diagnosis not present

## 2021-09-30 DIAGNOSIS — Z170 Estrogen receptor positive status [ER+]: Secondary | ICD-10-CM | POA: Insufficient documentation

## 2021-09-30 DIAGNOSIS — D0512 Intraductal carcinoma in situ of left breast: Secondary | ICD-10-CM

## 2021-09-30 DIAGNOSIS — Z7289 Other problems related to lifestyle: Secondary | ICD-10-CM

## 2021-09-30 DIAGNOSIS — Z801 Family history of malignant neoplasm of trachea, bronchus and lung: Secondary | ICD-10-CM | POA: Diagnosis not present

## 2021-09-30 DIAGNOSIS — M8580 Other specified disorders of bone density and structure, unspecified site: Secondary | ICD-10-CM | POA: Insufficient documentation

## 2021-09-30 DIAGNOSIS — Z83511 Family history of glaucoma: Secondary | ICD-10-CM

## 2021-09-30 DIAGNOSIS — Z7951 Long term (current) use of inhaled steroids: Secondary | ICD-10-CM | POA: Diagnosis not present

## 2021-09-30 LAB — CMP (CANCER CENTER ONLY)
ALT: 15 U/L (ref 0–44)
AST: 17 U/L (ref 15–41)
Albumin: 4.4 g/dL (ref 3.5–5.0)
Alkaline Phosphatase: 74 U/L (ref 38–126)
Anion gap: 8 (ref 5–15)
BUN: 16 mg/dL (ref 8–23)
CO2: 27 mmol/L (ref 22–32)
Calcium: 9.4 mg/dL (ref 8.9–10.3)
Chloride: 103 mmol/L (ref 98–111)
Creatinine: 0.81 mg/dL (ref 0.44–1.00)
GFR, Estimated: >60 mL/min (ref >60)
Glucose, Bld: 97 mg/dL (ref 70–99)
Potassium: 3.9 mmol/L (ref 3.5–5.1)
Sodium: 138 mmol/L (ref 135–145)
Total Bilirubin: 1.1 mg/dL (ref 0.3–1.2)
Total Protein: 7.3 g/dL (ref 6.5–8.1)

## 2021-09-30 LAB — CBC WITH DIFFERENTIAL (CANCER CENTER ONLY)
Abs Immature Granulocytes: 0.03 10*3/uL (ref 0.00–0.07)
Basophils Absolute: 0 10*3/uL (ref 0.0–0.1)
Basophils Relative: 1 %
Eosinophils Absolute: 0.2 10*3/uL (ref 0.0–0.5)
Eosinophils Relative: 3 %
HCT: 40.5 % (ref 36.0–46.0)
Hemoglobin: 13.6 g/dL (ref 12.0–15.0)
Immature Granulocytes: 0 %
Lymphocytes Relative: 24 %
Lymphs Abs: 1.9 10*3/uL (ref 0.7–4.0)
MCH: 33.3 pg (ref 26.0–34.0)
MCHC: 33.6 g/dL (ref 30.0–36.0)
MCV: 99 fL (ref 80.0–100.0)
Monocytes Absolute: 0.9 10*3/uL (ref 0.1–1.0)
Monocytes Relative: 12 %
Neutro Abs: 4.8 10*3/uL (ref 1.7–7.7)
Neutrophils Relative %: 60 %
Platelet Count: 250 10*3/uL (ref 150–400)
RBC: 4.09 MIL/uL (ref 3.87–5.11)
RDW: 13 % (ref 11.5–15.5)
WBC Count: 7.9 10*3/uL (ref 4.0–10.5)
nRBC: 0 % (ref 0.0–0.2)

## 2021-09-30 LAB — GENETIC SCREENING ORDER

## 2021-09-30 NOTE — Research (Signed)
Trial: [id number] - Tissue and Bodily Fluids: Translational Medicine: Discovery and Evaluation of Biomarkers/Pharmacogenomics for the Diagnosis and Personalized Management of Patients    Patient Ruth Wolfe was identified by Dr. Lindi Adie as a potential candidate for the above listed study.  This Clinical Research Nurse met with [name], [id number], on 09/30/21 in a manner and location that ensures patient privacy to discuss participation in the above listed research study.  Patient is Accompanied by her friend .  A copy of the informed consent document and separate HIPAA Authorization was provided to the patient.  Patient reads, speaks, and understands [place].   Patient was provided with the business card of this Nurse and encouraged to contact the research team with any questions.  Approximately 10 minutes were spent with the patient reviewing the informed consent documents.  Patient was provided the option of taking informed consent documents home to review and was encouraged to review at their convenience with their support network, including other care providers. Patient took the consent documents home to review.  Patient states she is interested in participating and agreed for research nurse to follow up with phone call next week. [name], BSN, RN, [place] Nurse II 09/30/2021

## 2021-09-30 NOTE — Progress Notes (Signed)
[facility] NOTE  Patient Care Team: Marrian Salvage, Erda as PCP - General (Internal Medicine) Mauro Kaufmann, RN as Oncology Nurse Navigator Rockwell Germany, RN as Oncology Nurse Navigator Erroll Luna, MD as Consulting Physician (General Surgery) Nicholas Lose, MD as Consulting Physician (Hematology and Oncology) Gery Pray, MD as Consulting Physician (Radiation Oncology)  CHIEF COMPLAINTS/PURPOSE OF CONSULTATION:  Newly diagnosed breast cancer  HISTORY OF PRESENTING ILLNESS:  Ruth Wolfe 76 y.o. female is here because of recent diagnosis of left breast cancer.  She had a screening mammogram that detected left breast calcifications measuring 1 cm.  Stereotactic biopsy was performed which revealed intermediate grade DCIS.  ER/PR were positive.  She was presented in the multidisciplinary tumor board and she is here today to discuss treatment plan accompanied by her friend.  I reviewed her records extensively and collaborated the history with the patient.  SUMMARY OF ONCOLOGIC HISTORY: Oncology History  Ductal carcinoma in situ (DCIS) of left breast  09/22/2021 Initial Diagnosis   Screening mammogram detected left breast calcifications UOQ 1 cm, stereotactic biopsy revealed intermediate grade DCIS, ER/PR positive   09/30/2021 Cancer Staging   Staging form: Breast, AJCC 8th Edition - Clinical: Stage 0 (cTis (DCIS), cN0, cM0, G2, ER: Unknown, PR: Unknown) - Signed by Nicholas Lose, MD on 09/30/2021 Stage prefix: Initial diagnosis Histologic grading system: 3 grade system      MEDICAL HISTORY:  Past Medical History:  Diagnosis Date   Allergy    Arthritis    Asthma    Breast cancer ([place])    Bruit    Per patient   Environmental allergies    GERD (gastroesophageal reflux disease)    Glaucoma    POAG OU   Hyperlipidemia    Hypertensive retinopathy    OU   Macular degeneration    Neuropathy    legs and arms   Osteopenia    Paralyzed vocal  cords    Per patient   Tongue atrophy    Per patient    SURGICAL HISTORY: Past Surgical History:  Procedure Laterality Date   APPENDECTOMY     CATARACT EXTRACTION     CERVICAL SPINE SURGERY  01/30/2020   Per patient   TOTAL ABDOMINAL HYSTERECTOMY  1981    SOCIAL HISTORY: Social History   Socioeconomic History   Marital status: Divorced    Spouse name: Not on file   Number of children: Not on file   Years of education: Not on file   Highest education level: Not on file  Occupational History   Not on file  Tobacco Use   Smoking status: Former    Types: Cigarettes   Smokeless tobacco: Never  Vaping Use   Vaping Use: Never used  Substance and Sexual Activity   Alcohol use: Yes    Alcohol/week: 5.0 - 6.0 standard drinks of alcohol    Types: 5 - 6 Cans of beer per week   Drug use: No   Sexual activity: Not on file  Other Topics Concern   Not on file  Social History Narrative   Not on file   Social Determinants of Health   Financial Resource Strain: Low Risk  (07/02/2021)   Overall Financial Resource Strain (CARDIA)    Difficulty of Paying Living Expenses: Not hard at all  Food Insecurity: No Food Insecurity (07/02/2021)   Hunger Vital Sign    Worried About Running Out of Food in the Last Year: Never true  Ran Out of Food in the Last Year: Never true  Transportation Needs: No Transportation Needs (07/02/2021)   PRAPARE - Hydrologist (Medical): No    Lack of Transportation (Non-Medical): No  Physical Activity: Sufficiently Active (07/02/2021)   Exercise Vital Sign    Days of Exercise per Week: 3 days    Minutes of Exercise per Session: 50 min  Stress: No Stress Concern Present (07/02/2021)   [place]    Feeling of Stress : Not at all  Social Connections: Not on file  Intimate Partner Violence: Not on file    FAMILY HISTORY: Family History  Problem Relation Age of  Onset   Hypertension Mother    Lung cancer Father    Glaucoma Father    Gout Father    Hypertension Sister    Liver disease Sister    Colon cancer Neg Hx    Stomach cancer Neg Hx     ALLERGIES:  is allergic to sulfa antibiotics.  MEDICATIONS:  Current Outpatient Medications  Medication Sig Dispense Refill   Cholecalciferol (VITAMIN D3 PO) Take by mouth. 4000iu     fluticasone (FLONASE) 50 MCG/ACT nasal spray Place 2 sprays into both nostrils daily. 16 g 6   fluticasone-salmeterol (WIXELA INHUB) 250-50 MCG/ACT AEPB Inhale 1 puff into the lungs in the morning and at bedtime. 3 each 3   gabapentin (NEURONTIN) 100 MG capsule Take 2 tablets in the am and take 2 in the pm as directed 360 capsule 1   latanoprost (XALATAN) 0.005 % ophthalmic solution Place 1 drop into both eyes at bedtime. (Patient not taking: Reported on 09/08/2021)     vitamin B-12 (CYANOCOBALAMIN) 1000 MCG tablet Take 1,000 mcg by mouth daily.     No current facility-administered medications for this visit.    REVIEW OF SYSTEMS:   Constitutional: Denies fevers, chills or abnormal night sweats Breast:  Denies any palpable lumps or discharge All other systems were reviewed with the patient and are negative.  PHYSICAL EXAMINATION: ECOG PERFORMANCE STATUS: 0 - Asymptomatic  Vitals:   09/30/21 1235  BP: (!) 153/89  Pulse: 86  Resp: 18  Temp: (!) 97.2 F (36.2 C)  SpO2: 98%   Filed Weights   09/30/21 1235  Weight: 158 lb 12.8 oz (72 kg)    GENERAL:alert, no distress and comfortable    LABORATORY DATA:  I have reviewed the data as listed Lab Results  Component Value Date   WBC 7.9 09/30/2021   HGB 13.6 09/30/2021   HCT 40.5 09/30/2021   MCV 99.0 09/30/2021   PLT 250 09/30/2021   Lab Results  Component Value Date   NA 138 09/30/2021   K 3.9 09/30/2021   CL 103 09/30/2021   CO2 27 09/30/2021    RADIOGRAPHIC STUDIES: I have personally reviewed the radiological reports and agreed with the findings  in the report.  ASSESSMENT AND PLAN:  Ductal carcinoma in situ (DCIS) of left breast 09/22/2021:Screening mammogram detected left breast calcifications UOQ 1 cm, stereotactic biopsy revealed intermediate grade DCIS, ER/PR pending  Pathology review: I discussed with the patient the difference between DCIS and invasive breast cancer. It is considered a precancerous lesion. DCIS is classified as a 0. It is generally detected through mammograms as calcifications. We discussed the significance of grades and its impact on prognosis. We also discussed the importance of ER and PR receptors and their implications to adjuvant treatment options. Prognosis of  DCIS dependence on grade, comedo necrosis. It is anticipated that if not treated, 20-30% of DCIS can develop into invasive breast cancer.  Recommendation: 1. Breast conserving surgery 2. +/-  adjuvant radiation therapy 3. Followed by antiestrogen therapy with tamoxifen 5 years     Tamoxifen counseling: We discussed the risks and benefits of tamoxifen. These include but not limited to insomnia, hot flashes, mood changes, vaginal dryness, and weight gain. Although rare, serious side effects including endometrial cancer, risk of blood clots were also discussed. We strongly believe that the benefits far outweigh the risks.  Planned treatment duration is 5 years.  Return to clinic after surgery to discuss the final pathology report and come up with an adjuvant treatment plan.  All questions were answered. The patient knows to call the clinic with any problems, questions or concerns.    Harriette Ohara, MD 09/30/21

## 2021-09-30 NOTE — Telephone Encounter (Signed)
Ruth Wolfe was seen by a genetic counselor during the breast multidisciplinary clinic on 09/30/2021. In addition to her personal history of breast cancer, she reported a family history of a father with lung cancer (he smoked) and a paternal cousin with a glioblastoma. She does not meet NCCN criteria for genetic testing at this time. She was still offered genetic counseling and testing but declined. We encourage her to contact us if there are any changes to her personal or family history of cancer. If she meets NCCN criteria based on the updated personal/family history, she would be recommended to have genetic counseling and testing.   Ruth Passy, MS, [facility] Genetic Counselor [email or web address] (P) [phone]

## 2021-09-30 NOTE — Assessment & Plan Note (Signed)
09/22/2021:Screening mammogram detected left breast calcifications UOQ 1 cm, stereotactic biopsy revealed intermediate grade DCIS, ER/PR pending  Pathology review: I discussed with the patient the difference between DCIS and invasive breast cancer. It is considered a precancerous lesion. DCIS is classified as a 0. It is generally detected through mammograms as calcifications. We discussed the significance of grades and its impact on prognosis. We also discussed the importance of ER and PR receptors and their implications to adjuvant treatment options. Prognosis of DCIS dependence on grade, comedo necrosis. It is anticipated that if not treated, 20-30% of DCIS can develop into invasive breast cancer.  Recommendation: 1. Breast conserving surgery 2. Followed by adjuvant radiation therapy 3. Followed by antiestrogen therapy with tamoxifen 5 years (if she is ER/PR positive)   Tamoxifen counseling: We discussed the risks and benefits of tamoxifen. These include but not limited to insomnia, hot flashes, mood changes, vaginal dryness, and weight gain. Although rare, serious side effects including endometrial cancer, risk of blood clots were also discussed. We strongly believe that the benefits far outweigh the risks. Patient understands these risks and consented to starting treatment. Planned treatment duration is 5 years.  Return to clinic after surgery to discuss the final pathology report and come up with an adjuvant treatment plan.

## 2021-10-02 ENCOUNTER — Telehealth: Payer: Self-pay | Admitting: Hematology and Oncology

## 2021-10-02 ENCOUNTER — Telehealth: Payer: Self-pay | Admitting: *Deleted

## 2021-10-02 ENCOUNTER — Encounter: Payer: Self-pay | Admitting: *Deleted

## 2021-10-02 NOTE — Telephone Encounter (Signed)
Scheduled per 8/4 in basket, pt has been called and confirmed

## 2021-10-02 NOTE — Telephone Encounter (Signed)
Spoke to pt concerning Foley from 09/30/21. Denies questions or concern regarding dx or treatment care plan. Encourage pt call with needs. Received verbal understanding.

## 2021-10-05 ENCOUNTER — Telehealth: Payer: Self-pay | Admitting: *Deleted

## 2021-10-05 DIAGNOSIS — Z006 Encounter for examination for normal comparison and control in clinical research program: Secondary | ICD-10-CM

## 2021-10-05 NOTE — Telephone Encounter (Signed)
MTG-015 - Tissue and Bodily Fluids: Translational Medicine: Discovery and Evaluation of Biomarkers/Pharmacogenomics for the Diagnosis and Personalized Management of Patients   Called patient to follow up on the MT3505-A blood and data collection study.  Patient states she does not have any questions and would like to participate. She can come in tomorrow morning at 8:30 am.  Thanked patient for her time and look forward to seeing her in the morning. Patient will meet with research for consent and data collection at 8:30 am with Lab for blood collection at 9 am.  [name], BSN, RN, [place] Nurse II 10/05/2021 2:07 PM

## 2021-10-06 ENCOUNTER — Inpatient Hospital Stay: Payer: Medicare Other

## 2021-10-06 ENCOUNTER — Encounter: Payer: Self-pay | Admitting: *Deleted

## 2021-10-06 ENCOUNTER — Other Ambulatory Visit: Payer: Self-pay

## 2021-10-06 ENCOUNTER — Inpatient Hospital Stay: Payer: Medicare Other | Admitting: *Deleted

## 2021-10-06 DIAGNOSIS — M25551 Pain in right hip: Secondary | ICD-10-CM | POA: Diagnosis not present

## 2021-10-06 DIAGNOSIS — D0512 Intraductal carcinoma in situ of left breast: Secondary | ICD-10-CM

## 2021-10-06 DIAGNOSIS — Z006 Encounter for examination for normal comparison and control in clinical research program: Secondary | ICD-10-CM

## 2021-10-06 LAB — RESEARCH LABS

## 2021-10-06 NOTE — Research (Signed)
Trial Name:  MTG-015 - Tissue and Bodily Fluids: Translational Medicine: Discovery and Evaluation of Biomarkers/Pharmacogenomics for the Diagnosis and Personalized Management of Patients    Patient Ruth Wolfe was identified by Dr. Gladstone Pih as a potential candidate for the above listed study.  This Clinical Research Nurse met with [name], [id number] on 10/06/21 in a manner and location that ensures patient privacy to discuss participation in the above listed research study.  Patient is Unaccompanied.  Patient was previously provided with informed consent documents.  Patient has not yet read the informed consent documents and so documents were reviewed page by page today.  As outlined in the informed consent form, this Nurse and [name] discussed the purpose of the research study, the investigational nature of the study, study procedures and requirements for study participation, potential risks and benefits of study participation, as well as alternatives to participation.  This study is not blinded or double-blinded. The patient understands participation is voluntary and they may withdraw from study participation at any time.  This study does not involve randomization.  This study does not involve an investigational drug or device. This study does not involve a placebo. Patient understands enrollment is pending full eligibility review.   Confidentiality and how the patient's information will be used as part of study participation were discussed.  Patient was informed there is reimbursement provided for their time and effort spent on trial participation.  The patient is encouraged to discuss research study participation with their insurance provider to determine what costs they may incur as part of study participation, including research related injury.    All questions were answered to patient's satisfaction.  The informed consent and separate HIPAA Authorization was reviewed page by page.  The  patient's mental and emotional status is appropriate to provide informed consent, and the patient verbalizes an understanding of study participation.  Patient has agreed to participate in the above listed research study and has voluntarily signed the informed consent IRB approved Jan 19, 2021 and separate HIPAA Authorization, version 5, revised 02/14/19, IRB approved Jan 19, 2021  on 10/06/21 at 8:35AM.  The patient was provided with a copy of the signed informed consent form and separate HIPAA Authorization for their reference.  No study specific procedures were obtained prior to the signing of the informed consent document.  Approximately 20 minutes were spent with the patient reviewing the informed consent documents.  Patient was not requested to complete a Release of Information form.   The following social, medical, and family history was collected from the patient on 10/06/21.  Date and time of last meal prior to blood collection: _6:00 AM  Does the patient drink beer, wine, or liquor?  Yes       No       Occasional       Declined to Answer       Unknown If yes, number of drinks per week:    <1 1-3 4-6 7 or more  Does the patient smoke?        No      Current         Former         If former, date quit: _can't remember, in college Select all that apply:      Cigarettes         Cigars         Chewing Tobacco Number of years smoked: _____3_       Packs per  day: 3 cigarettes per day (.15 packs) Pack years (years smoked x packs per day): __3 x 0.15 packs per day = .45  Menopausal status?      Premenopausal           Perimenopausal              Postmenopausal          N/A Date of last menstrual cycle: __1981- total hysterectomy__     OR   Unknown  Family history of cancer in immediate family (grandparents, parents, and/or siblings)? Relationship:    Father_      Cancer Type:    Lung_  Date of diagnosis:    2000_     Outcome:    Died_  Vaccinated for COVID-19?    YES     NO    If yes,  vaccine manufacturer: _Pfizer______________ Number of doses received: _5_________ Date of last dose: 9/21/22________________ Have you ever tested positive for COVID? NO_________________ Date of positive COVID test? _______________ Date of last COVID test taken? ____________________ Other vaccinations in past year? _YES________  If yes, which vaccinations and dates? FLU- Fall 2022, TDAP 2022__________________________  Concurrent Medications (including those taken in past 30 days) and Medical History reviewed with patient and updated in EMR.   Eligibility: Eligibility criteria reviewed with patient. This Nurse has reviewed this patient's inclusion and exclusion criteria and confirmed patient is eligible for study participation. Eligibility confirmed by treating investigator, who also agrees that patient should proceed with enrollment. Patient will continue with enrollment.  Blood Collection: Research blood obtained by [facility] venipuncture. Patient tolerated well without any adverse events.  Gift Card: $50 gift card given to patient for her participation in this study.    Thanked patient for her participation in this study.  [name], BSN, RN, [employer] Research Nurse II 10/06/2021

## 2021-10-06 NOTE — Research (Signed)
MTG-015 - Tissue and Bodily Fluids: Translational Medicine: Discovery and Evaluation of Biomarkers/Pharmacogenomics for the Diagnosis and Personalized Management of Patients    10/06/21  ELIGIBILITY:  This Coordinator has reviewed this patient's inclusion and exclusion criteria and confirmed Ruth Wolfe is eligible for study participation.  Patient will continue with enrollment.  Eligibility confirmed by treating investigator, who also agrees that patient should proceed with enrollment.   Carol Ada, RT(R)(T) Clinical Research Coordinator

## 2021-10-08 ENCOUNTER — Encounter: Payer: Self-pay | Admitting: General Practice

## 2021-10-08 NOTE — Progress Notes (Signed)
[facility] Psychosocial Distress Screening Spiritual Care  Met with Rosann Auerbach by phone following Breast Multidisciplinary Clinic to introduce [facility] team/resources, reviewing distress screen per protocol.  The patient scored a 5 on the Psychosocial Distress Thermometer which indicates moderate distress. Also assessed for distress and other psychosocial needs.    10/08/2021  ONCBCN DISTRESS SCREENING   Screening Type Initial Screening   Distress experienced in past week (1-10) 5   Emotional problem type Nervousness/Anxiety   Referral to support programs Yes    Ms Buttermore notes that she is "doing fine" about her diagnosis--the most stressful part is managing all the information and appointments. She describes herself as a private person and prefers not to share her health information with many people. Her faith and prayer are very important to her coping and meaning-making. Ms Verbeek is interested in cooking class and plans to investigate [employer] [facility] support programming.  Follow up needed: No. Ms Belland has direct Spiritual Care number in case needs arise or circumstances change.   [place], [place], [facility] Pager [phone] Voicemail [phone]

## 2021-10-21 NOTE — Pre-Procedure Instructions (Signed)
Surgical Instructions    Your procedure is scheduled on Wednesday 10/28/21.   Report to [facility] Main Entrance "A" at 07:30 A.M., then check in with the Admitting office.  Call this number if you have problems the morning of surgery:  [phone]   If you have any questions prior to your surgery date call [phone]: Open Monday-Friday 8am-4pm    Remember:  Do not eat after midnight the night before your surgery  You may drink clear liquids until 06:30 A.M. the morning of your surgery.   Clear liquids allowed are: Water, Non-Citrus Juices (without pulp), Carbonated Beverages, Clear Tea, Black Coffee ONLY (NO MILK, CREAM OR POWDERED CREAMER of any kind), and Gatorade    Take these medicines the morning of surgery with A SIP OF WATER:   gabapentin (NEURONTIN)  Polyethyl Glycol-Propyl Glycol (SYSTANE OP)  fluticasone-salmeterol (WIXELA INHUB)   Take these medicines if needed:   acetaminophen (TYLENOL)   fluticasone (FLONASE) 50 MCG/ACT    As of today, STOP taking any Aspirin (unless otherwise instructed by your surgeon) Aleve, Naproxen, Ibuprofen, Motrin, Advil, Goody's, BC's, all herbal medications, fish oil, and all vitamins.           Do not wear jewelry or makeup. Do not wear lotions, powders, perfumes/cologne or deodorant. Do not shave 48 hours prior to surgery.  Men may shave face and neck. Do not bring valuables to the hospital. Do not wear nail polish, gel polish, artificial nails, or any other type of covering on natural nails (fingers and toes) If you have artificial nails or gel coating that need to be removed by a nail salon, please have this removed prior to surgery. Artificial nails or gel coating may interfere with anesthesia's ability to adequately monitor your vital signs.  [facility] is not responsible for any belongings or valuables.    Do NOT Smoke (Tobacco/Vaping)  24 hours prior to your procedure  If you use a CPAP at night, you may bring your mask  for your overnight stay.   Contacts, glasses, hearing aids, dentures or partials may not be worn into surgery, please bring cases for these belongings   For patients admitted to the hospital, discharge time will be determined by your treatment team.   Patients discharged the day of surgery will not be allowed to drive home, and someone needs to stay with them for 24 hours.   SURGICAL WAITING ROOM VISITATION Patients having surgery or a procedure may have no more than 2 support people in the waiting area - these visitors may rotate.   Children under the age of 55 must have an adult with them who is not the patient. If the patient needs to stay at the hospital during part of their recovery, the visitor guidelines for inpatient rooms apply. Pre-op nurse will coordinate an appropriate time for 1 support person to accompany patient in pre-op.  This support person may not rotate.   Please refer to the [facility] website for the visitor guidelines for Inpatients (after your surgery is over and you are in a regular room).    Special instructions:    Oral Hygiene is also important to reduce your risk of infection.  Remember - BRUSH YOUR TEETH THE MORNING OF SURGERY WITH YOUR REGULAR TOOTHPASTE   [facility]- Preparing For Surgery  Before surgery, you can play an important role. Because skin is not sterile, your skin needs to be as free of germs as possible. You can reduce the number of germs  on your skin by washing with CHG (chlorahexidine gluconate) Soap before surgery.  CHG is an antiseptic cleaner which kills germs and bonds with the skin to continue killing germs even after washing.     Please do not use if you have an allergy to CHG or antibacterial soaps. If your skin becomes reddened/irritated stop using the CHG.  Do not shave (including legs and underarms) for at least 48 hours prior to first CHG shower. It is OK to shave your face.  Please follow these instructions carefully.      Shower the NIGHT BEFORE SURGERY and the MORNING OF SURGERY with CHG Soap.   If you chose to wash your hair, wash your hair first as usual with your normal shampoo. After you shampoo, rinse your hair and body thoroughly to remove the shampoo.  Then [employer] and genitals (private parts) with your normal soap and rinse thoroughly to remove soap.  After that Use CHG Soap as you would any other liquid soap. You can apply CHG directly to the skin and wash gently with a scrungie or a clean washcloth.   Apply the CHG Soap to your body ONLY FROM THE NECK DOWN.  Do not use on open wounds or open sores. Avoid contact with your eyes, ears, mouth and genitals (private parts). Wash Face and genitals (private parts)  with your normal soap.   Wash thoroughly, paying special attention to the area where your surgery will be performed.  Thoroughly rinse your body with warm water from the neck down.  DO NOT shower/wash with your normal soap after using and rinsing off the CHG Soap.  Pat yourself dry with a CLEAN TOWEL.  Wear CLEAN PAJAMAS to bed the night before surgery  Place CLEAN SHEETS on your bed the night before your surgery  DO NOT SLEEP WITH PETS.   Day of Surgery:  Take a shower with CHG soap. Wear Clean/Comfortable clothing the morning of surgery Do not apply any deodorants/lotions.   Remember to brush your teeth WITH YOUR REGULAR TOOTHPASTE.    If you received a COVID test during your pre-op visit, it is requested that you wear a mask when out in public, stay away from anyone that may not be feeling well, and notify your surgeon if you develop symptoms. If you have been in contact with anyone that has tested positive in the last 10 days, please notify your surgeon.    Please read over the following fact sheets that you were given.

## 2021-10-22 ENCOUNTER — Encounter (HOSPITAL_COMMUNITY): Payer: Self-pay

## 2021-10-22 ENCOUNTER — Encounter (HOSPITAL_COMMUNITY)
Admission: RE | Admit: 2021-10-22 | Discharge: 2021-10-22 | Disposition: A | Discharge status: Home / Self Care | Payer: Medicare Other | Source: Ambulatory Visit | Attending: Surgery | Admitting: Surgery

## 2021-10-22 ENCOUNTER — Other Ambulatory Visit: Payer: Self-pay

## 2021-10-22 DIAGNOSIS — Z01818 Encounter for other preprocedural examination: Secondary | ICD-10-CM | POA: Diagnosis not present

## 2021-10-22 HISTORY — DX: Fibromyalgia: M79.7

## 2021-10-22 HISTORY — DX: Personal history of other diseases of the digestive system: Z87.19

## 2021-10-22 NOTE — Progress Notes (Signed)
PCP - Jodi Mourning Cardiologist - denies Neurologist: Kristeen Miss  PPM/ICD - denies   Chest x-ray - n/a EKG - n/a Stress Test - denies ECHO - denies Cardiac Cath - denies  Sleep Study - denies   NO diabetes  As of today, STOP taking any Aspirin (unless otherwise instructed by your surgeon) Aleve, Naproxen, Ibuprofen, Motrin, Advil, Goody's, BC's, all herbal medications, fish oil, and all vitamins.  ERAS Protcol -yes PRE-SURGERY Ensure or G2- none ordered  COVID TEST- n/a   Anesthesia review: no  Patient denies shortness of breath, fever, cough and chest pain at PAT appointment   All instructions explained to the patient, with a verbal understanding of the material. Patient agrees to go over the instructions while at home for a better understanding. Patient also instructed to self quarantine after being tested for COVID-19. The opportunity to ask questions was provided.

## 2021-10-27 DIAGNOSIS — D0511 Intraductal carcinoma in situ of right breast: Secondary | ICD-10-CM | POA: Diagnosis not present

## 2021-10-28 ENCOUNTER — Ambulatory Visit (HOSPITAL_COMMUNITY): Payer: Medicare Other | Admitting: Physician Assistant

## 2021-10-28 ENCOUNTER — Other Ambulatory Visit: Payer: Self-pay

## 2021-10-28 ENCOUNTER — Encounter (HOSPITAL_COMMUNITY): Payer: Self-pay | Admitting: Surgery

## 2021-10-28 ENCOUNTER — Ambulatory Visit (HOSPITAL_COMMUNITY)
Admission: RE | Admit: 2021-10-28 | Discharge: 2021-10-28 | Disposition: A | Discharge status: Home / Self Care | Payer: Medicare Other | Attending: Surgery | Admitting: Surgery

## 2021-10-28 ENCOUNTER — Encounter (HOSPITAL_COMMUNITY)
Admission: RE | Disposition: A | Discharge status: Home / Self Care | Payer: Self-pay | Source: Home / Self Care | Attending: Surgery

## 2021-10-28 ENCOUNTER — Ambulatory Visit (HOSPITAL_BASED_OUTPATIENT_CLINIC_OR_DEPARTMENT_OTHER): Payer: Medicare Other | Admitting: Certified Registered Nurse Anesthetist

## 2021-10-28 DIAGNOSIS — N6032 Fibrosclerosis of left breast: Secondary | ICD-10-CM | POA: Diagnosis not present

## 2021-10-28 DIAGNOSIS — Z87891 Personal history of nicotine dependence: Secondary | ICD-10-CM | POA: Diagnosis not present

## 2021-10-28 DIAGNOSIS — K219 Gastro-esophageal reflux disease without esophagitis: Secondary | ICD-10-CM | POA: Insufficient documentation

## 2021-10-28 DIAGNOSIS — D0512 Intraductal carcinoma in situ of left breast: Secondary | ICD-10-CM | POA: Insufficient documentation

## 2021-10-28 DIAGNOSIS — C50912 Malignant neoplasm of unspecified site of left female breast: Secondary | ICD-10-CM | POA: Diagnosis not present

## 2021-10-28 DIAGNOSIS — K449 Diaphragmatic hernia without obstruction or gangrene: Secondary | ICD-10-CM | POA: Diagnosis not present

## 2021-10-28 HISTORY — PX: BREAST LUMPECTOMY WITH RADIOACTIVE SEED LOCALIZATION: SHX6424

## 2021-10-28 SURGERY — BREAST LUMPECTOMY WITH RADIOACTIVE SEED LOCALIZATION
Anesthesia: General | Site: Breast | Laterality: Left

## 2021-10-28 MED ORDER — PROPOFOL 10 MG/ML IV BOLUS
INTRAVENOUS | Status: DC | PRN
  Administered 2021-10-28: 80 mg via INTRAVENOUS
  Administered 2021-10-28: 110 mg via INTRAVENOUS

## 2021-10-28 MED ORDER — CEFAZOLIN SODIUM-DEXTROSE 2-4 GM/100ML-% IV SOLN
2.0000 g | INTRAVENOUS | Status: AC
  Administered 2021-10-28: 2 g via INTRAVENOUS
  Filled 2021-10-28: qty 100

## 2021-10-28 MED ORDER — OXYCODONE HCL 5 MG PO TABS: 5.0000 mg | ORAL_TABLET | Freq: Four times a day (QID) | ORAL | 0 refills | Status: DC | PRN

## 2021-10-28 MED ORDER — ONDANSETRON HCL 4 MG/2ML IJ SOLN
INTRAMUSCULAR | Status: DC | PRN
  Administered 2021-10-28: 4 mg via INTRAVENOUS

## 2021-10-28 MED ORDER — FENTANYL CITRATE (PF) 250 MCG/5ML IJ SOLN
INTRAMUSCULAR | Status: DC | PRN
  Administered 2021-10-28: 100 ug via INTRAVENOUS
  Administered 2021-10-28: 50 ug via INTRAVENOUS

## 2021-10-28 MED ORDER — LACTATED RINGERS IV SOLN: INTRAVENOUS | Status: DC

## 2021-10-28 MED ORDER — ACETAMINOPHEN 500 MG PO TABS
1000.0000 mg | ORAL_TABLET | ORAL | Status: AC
  Administered 2021-10-28: 1000 mg via ORAL
  Filled 2021-10-28: qty 2

## 2021-10-28 MED ORDER — OXYCODONE HCL 5 MG PO TABS: 5.0000 mg | ORAL_TABLET | Freq: Once | ORAL | Status: DC | PRN

## 2021-10-28 MED ORDER — ORAL CARE MOUTH RINSE: 15.0000 mL | Freq: Once | OROMUCOSAL | Status: DC

## 2021-10-28 MED ORDER — FENTANYL CITRATE (PF) 100 MCG/2ML IJ SOLN: 25.0000 ug | INTRAMUSCULAR | Status: DC | PRN

## 2021-10-28 MED ORDER — FENTANYL CITRATE (PF) 250 MCG/5ML IJ SOLN
INTRAMUSCULAR | Status: AC
  Filled 2021-10-28: qty 5

## 2021-10-28 MED ORDER — 0.9 % SODIUM CHLORIDE (POUR BTL) OPTIME
TOPICAL | Status: DC | PRN
  Administered 2021-10-28: 1000 mL

## 2021-10-28 MED ORDER — ACETAMINOPHEN 500 MG PO TABS: 1000.0000 mg | ORAL_TABLET | Freq: Once | ORAL | Status: DC | PRN

## 2021-10-28 MED ORDER — ACETAMINOPHEN 10 MG/ML IV SOLN: 1000.0000 mg | Freq: Once | INTRAVENOUS | Status: DC | PRN

## 2021-10-28 MED ORDER — CHLORHEXIDINE GLUCONATE CLOTH 2 % EX PADS: 6.0000 | MEDICATED_PAD | Freq: Once | CUTANEOUS | Status: DC

## 2021-10-28 MED ORDER — OXYCODONE HCL 5 MG/5ML PO SOLN: 5.0000 mg | Freq: Once | ORAL | Status: DC | PRN

## 2021-10-28 MED ORDER — LIDOCAINE 2% (20 MG/ML) 5 ML SYRINGE
INTRAMUSCULAR | Status: DC | PRN
  Administered 2021-10-28: 60 mg via INTRAVENOUS

## 2021-10-28 MED ORDER — CHLORHEXIDINE GLUCONATE 0.12 % MT SOLN: 15.0000 mL | Freq: Once | OROMUCOSAL | Status: DC

## 2021-10-28 MED ORDER — BUPIVACAINE-EPINEPHRINE 0.25% -1:200000 IJ SOLN
INTRAMUSCULAR | Status: DC | PRN
  Administered 2021-10-28: 20 mL

## 2021-10-28 MED ORDER — ACETAMINOPHEN 160 MG/5ML PO SOLN: 1000.0000 mg | Freq: Once | ORAL | Status: DC | PRN

## 2021-10-28 MED ORDER — ORAL CARE MOUTH RINSE: 15.0000 mL | Freq: Once | OROMUCOSAL | Status: AC

## 2021-10-28 MED ORDER — DEXAMETHASONE SODIUM PHOSPHATE 10 MG/ML IJ SOLN
INTRAMUSCULAR | Status: DC | PRN
  Administered 2021-10-28: 10 mg via INTRAVENOUS

## 2021-10-28 MED ORDER — PROPOFOL 500 MG/50ML IV EMUL
INTRAVENOUS | Status: DC | PRN
  Administered 2021-10-28: 150 ug/kg/min via INTRAVENOUS

## 2021-10-28 MED ORDER — CHLORHEXIDINE GLUCONATE 0.12 % MT SOLN
15.0000 mL | Freq: Once | OROMUCOSAL | Status: AC
  Administered 2021-10-28: 15 mL via OROMUCOSAL
  Filled 2021-10-28: qty 15

## 2021-10-28 MED ORDER — PHENYLEPHRINE HCL-NACL 20-0.9 MG/250ML-% IV SOLN
INTRAVENOUS | Status: DC | PRN
  Administered 2021-10-28: 30 ug/min via INTRAVENOUS

## 2021-10-28 MED ORDER — LACTATED RINGERS IV SOLN
INTRAVENOUS | Status: DC
Start: 2021-10-28 — End: 2021-10-28

## 2021-10-28 SURGICAL SUPPLY — 43 items
ADH SKN CLS APL DERMABOND .7 (GAUZE/BANDAGES/DRESSINGS) ×1
APL PRP STRL LF DISP 70% ISPRP (MISCELLANEOUS) ×1
APPLIER CLIP 9.375 MED OPEN (MISCELLANEOUS)
APR CLP MED 9.3 20 MLT OPN (MISCELLANEOUS)
BAG COUNTER SPONGE SURGICOUNT (BAG) ×1 IMPLANT
BAG SPNG CNTER NS LX DISP (BAG) ×1
BINDER BREAST LRG (GAUZE/BANDAGES/DRESSINGS) IMPLANT
BINDER BREAST XLRG (GAUZE/BANDAGES/DRESSINGS) IMPLANT
CANISTER SUCT 3000ML PPV (MISCELLANEOUS) IMPLANT
CHLORAPREP W/TINT 26 (MISCELLANEOUS) ×1 IMPLANT
CLIP APPLIE 9.375 MED OPEN (MISCELLANEOUS) IMPLANT
COVER PROBE W GEL 5X96 (DRAPES) ×1 IMPLANT
COVER SURGICAL LIGHT HANDLE (MISCELLANEOUS) ×1 IMPLANT
DERMABOND ADVANCED (GAUZE/BANDAGES/DRESSINGS) ×1
DERMABOND ADVANCED .7 DNX12 (GAUZE/BANDAGES/DRESSINGS) ×1 IMPLANT
DEVICE DUBIN SPECIMEN MAMMOGRA (MISCELLANEOUS) ×1 IMPLANT
DRAPE CHEST BREAST 15X10 FENES (DRAPES) ×1 IMPLANT
ELECT CAUTERY BLADE 6.4 (BLADE) ×1 IMPLANT
ELECT REM PT RETURN 9FT ADLT (ELECTROSURGICAL) ×1
ELECTRODE REM PT RTRN 9FT ADLT (ELECTROSURGICAL) ×1 IMPLANT
GLOVE BIO SURGEON STRL SZ8 (GLOVE) ×1 IMPLANT
GLOVE BIOGEL PI IND STRL 8 (GLOVE) ×1 IMPLANT
GLOVE BIOGEL PI INDICATOR 8 (GLOVE) ×1
GOWN STRL REUS W/ TWL LRG LVL3 (GOWN DISPOSABLE) ×1 IMPLANT
GOWN STRL REUS W/ TWL XL LVL3 (GOWN DISPOSABLE) ×1 IMPLANT
GOWN STRL REUS W/TWL LRG LVL3 (GOWN DISPOSABLE) ×1
GOWN STRL REUS W/TWL XL LVL3 (GOWN DISPOSABLE) ×1
KIT BASIN OR (CUSTOM PROCEDURE TRAY) ×1 IMPLANT
KIT MARKER MARGIN INK (KITS) IMPLANT
LIGHT WAVEGUIDE WIDE FLAT (MISCELLANEOUS) IMPLANT
NDL HYPO 25GX1X1/2 BEV (NEEDLE) IMPLANT
NEEDLE HYPO 25GX1X1/2 BEV (NEEDLE) IMPLANT
NS IRRIG 1000ML POUR BTL (IV SOLUTION) IMPLANT
PACK GENERAL/GYN (CUSTOM PROCEDURE TRAY) ×1 IMPLANT
SUT MNCRL AB 4-0 PS2 18 (SUTURE) ×1 IMPLANT
SUT SILK 2 0 SH (SUTURE) IMPLANT
SUT VIC AB 2-0 SH 27 (SUTURE)
SUT VIC AB 2-0 SH 27XBRD (SUTURE) IMPLANT
SUT VIC AB 3-0 SH 27 (SUTURE)
SUT VIC AB 3-0 SH 27X BRD (SUTURE) IMPLANT
SUT VIC AB 3-0 SH 8-18 (SUTURE) ×1 IMPLANT
SYR CONTROL 10ML LL (SYRINGE) IMPLANT
TOWEL GREEN STERILE FF (TOWEL DISPOSABLE) IMPLANT

## 2021-10-28 NOTE — Interval H&P Note (Signed)
History and Physical Interval Note:  10/28/2021 9:28 AM  Ruth Wolfe  has presented today for surgery, with the diagnosis of LEFT BREAST DUCTAL CARCINOMA IN SITU.  The various methods of treatment have been discussed with the patient and family. After consideration of risks, benefits and other options for treatment, the patient has consented to  Procedure(s): LEFT BREAST SEED LUMPECTOMY (Left) as a surgical intervention.  The patient's history has been reviewed, patient examined, no change in status, stable for surgery.  I have reviewed the patient's chart and labs.  Questions were answered to the patient's satisfaction.     [place]

## 2021-10-28 NOTE — Discharge Instructions (Signed)
[facility] Office Phone Number [phone]  BREAST BIOPSY/ PARTIAL MASTECTOMY: POST OP INSTRUCTIONS  Always review your discharge instruction sheet given to you by the facility where your surgery was performed.  IF YOU HAVE DISABILITY OR FAMILY LEAVE FORMS, YOU MUST BRING THEM TO THE OFFICE FOR PROCESSING.  DO NOT GIVE THEM TO YOUR DOCTOR.  A prescription for pain medication may be given to you upon discharge.  Take your pain medication as prescribed, if needed.  If narcotic pain medicine is not needed, then you may take acetaminophen (Tylenol) or ibuprofen (Advil) as needed. Take your usually prescribed medications unless otherwise directed If you need a refill on your pain medication, please contact your pharmacy.  They will contact our office to request authorization.  Prescriptions will not be filled after 5pm or on week-ends. You should eat very light the first 24 hours after surgery, such as soup, crackers, pudding, etc.  Resume your normal diet the day after surgery. Most patients will experience some swelling and bruising in the breast.  Ice packs and a good support bra will help.  Swelling and bruising can take several days to resolve.  It is common to experience some constipation if taking pain medication after surgery.  Increasing fluid intake and taking a stool softener will usually help or prevent this problem from occurring.  A mild laxative (Milk of Magnesia or Miralax) should be taken according to package directions if there are no bowel movements after 48 hours. Unless discharge instructions indicate otherwise, you may remove your bandages 24-48 hours after surgery, and you may shower at that time.  You may have steri-strips (small skin tapes) in place directly over the incision.  These strips should be left on the skin for 7-10 days.  If your surgeon used skin glue on the incision, you may shower in 24 hours.  The glue will flake off over the next 2-3 weeks.  Any  sutures or staples will be removed at the office during your follow-up visit. ACTIVITIES:  You may resume regular daily activities (gradually increasing) beginning the next day.  Wearing a good support bra or sports bra minimizes pain and swelling.  You may have sexual intercourse when it is comfortable. You may drive when you no longer are taking prescription pain medication, you can comfortably wear a seatbelt, and you can safely maneuver your car and apply brakes. RETURN TO WORK:  ______________________________________________________________________________________ You should see your doctor in the office for a follow-up appointment approximately two weeks after your surgery.  Your doctor's nurse will typically make your follow-up appointment when she calls you with your pathology report.  Expect your pathology report 2-3 business days after your surgery.  You may call to check if you do not hear from us after three days. OTHER INSTRUCTIONS: _______________________________________________________________________________________________ _____________________________________________________________________________________________________________________________________ _____________________________________________________________________________________________________________________________________ _____________________________________________________________________________________________________________________________________  WHEN TO CALL YOUR DOCTOR: Fever over 101.0 Nausea and/or vomiting. Extreme swelling or bruising. Continued bleeding from incision. Increased pain, redness, or drainage from the incision.  The clinic staff is available to answer your questions during regular business hours.  Please don't hesitate to call and ask to speak to one of the nurses for clinical concerns.  If you have a medical emergency, go to the nearest emergency room or call 911.  A surgeon from [facility] is always on call at the hospital.  For further questions, please visit [email or web address]   

## 2021-10-28 NOTE — Transfer of Care (Signed)
Immediate Anesthesia Transfer of Care Note  Patient: Ruth Wolfe  Procedure(s) Performed: LEFT BREAST SEED LUMPECTOMY (Left: Breast)  Patient Location: PACU  Anesthesia Type:General  Level of Consciousness: awake, alert  and oriented  Airway & Oxygen Therapy: Patient Spontanous Breathing  Post-op Assessment: Report given to RN and Post -op Vital signs reviewed and stable  Post vital signs: Reviewed and stable  Last Vitals:  Vitals Value Taken Time  BP 111/65 10/28/21 1102  Temp    Pulse 76 10/28/21 1102  Resp 14 10/28/21 1102  SpO2 94 % 10/28/21 1102  Vitals shown include unvalidated device data.  Last Pain:  Vitals:   10/28/21 0735  TempSrc: Oral         Complications: No notable events documented.

## 2021-10-28 NOTE — Anesthesia Preprocedure Evaluation (Signed)
Anesthesia Evaluation  Patient identified by MRN, date of birth, ID band Patient awake    Reviewed: Allergy & Precautions, NPO status , Patient's Chart, lab work & pertinent test results  History of Anesthesia Complications Negative for: history of anesthetic complications  Airway Mallampati: III  TM Distance: >3 FB Neck ROM: Full    Dental  (+) Teeth Intact, Dental Advisory Given   Pulmonary former smoker,    breath sounds clear to auscultation       Cardiovascular negative cardio ROS   Rhythm:Regular     Neuro/Psych  Neuromuscular disease negative psych ROS   GI/Hepatic Neg liver ROS, hiatal hernia, GERD  ,  Endo/Other  negative endocrine ROS  Renal/GU Lab Results      Component                Value               Date                      CREATININE               0.81                09/30/2021                Musculoskeletal  (+) Arthritis , Fibromyalgia -  Abdominal   Peds  Hematology negative hematology ROS (+)   Anesthesia Other Findings   Reproductive/Obstetrics                            Anesthesia Physical Anesthesia Plan  ASA: 2  Anesthesia Plan: General   Post-op Pain Management: Tylenol PO (pre-op)*   Induction: Intravenous  PONV Risk Score and Plan: 3 and Ondansetron, Dexamethasone, Propofol infusion and TIVA  Airway Management Planned: LMA  Additional Equipment: None  Intra-op Plan:   Post-operative Plan: Extubation in OR  Informed Consent: I have reviewed the patients History and Physical, chart, labs and discussed the procedure including the risks, benefits and alternatives for the proposed anesthesia with the patient or authorized representative who has indicated his/her understanding and acceptance.     Dental advisory given  Plan Discussed with: CRNA  Anesthesia Plan Comments:         Anesthesia Quick Evaluation

## 2021-10-28 NOTE — Op Note (Signed)
eoperative diagnosis: Left breast DCIS upper outer quadrant   Postoperative diagnosis: Same   Procedure: Left breast seed localized lumpectomy  Surgeon: Erroll Luna M.D.  Anesthesia: Gen. With 0.25% Sensorcaine local  EBL: 20 cc  Specimen: Left breast tissue with clip and radioactive seed in the specimen. Verified with neoprobe and radiographic image showing both seed and clip in specimen  Indications for procedure: The patient presents for left breast  lumpectomy after core biopsy showed DCIS. Discussed wire/SEED localization.  Discussed mastectomy as well. Seen by [facility] and opted for breast conserving surgery. The procedure has been discussed with the patient. Alternatives to surgery have been discussed with the patient.  Risks of surgery include bleeding,  Infection,  Seroma formation, death,  cosmetic deformity and the need for further surgery.   The patient understands and wishes to proceed.   Description of procedure: Patient underwent seed placement as an outpatient. Patient presents today for left breast seed localized lumpectomy. Patient and holding area. Questions are answered. Patient taken back to the operating room and placed  supine upon the OR table. After induction of general anesthesia, left breast prepped and draped in a sterile fashion. Timeout was done to verify proper procedure. Neoprobe used and hot spot identified and left breast upper-outer quadrant. This was marked with pen. Curvilinear incision made left upper outer quadrant breast. Dissection used with the help of a neoprobe around the tissue where the seed and clip were located. Tissue removed in its entirety with gross negative  margins.Marlis Edelson used and seed within the specimen. Radiographs taken which show clip and seed in the specimen.  Local anesthetic infiltrated of 0.25% marcaine.     Clips used to mark the cavity. Hemostasis achieved and cavity closed with 3-0 Vicryl and 4-0 Monocryl. Dermabond applied. All  final counts found to be correct. Specimen transported to pathology. Patient awoke extubated taken to recovery in satisfactory condition.

## 2021-10-28 NOTE — H&P (Signed)
History of Present Illness: Ruth Wolfe is a 75 y.o. female who is seen today as an office consultation for evaluation of Breast Cancer .   Patient seen today in the [facility] for her abnormal mammogram. She had a cluster of microcalcifications left breast upper inner to upper central breast core biopsy proven to be DCIS. She has no history of breast pain, breast mass or nipple discharge.  Review of Systems: A complete review of systems was obtained from the patient. I have reviewed this information and discussed as appropriate with the patient. See HPI as well for other ROS.    Medical History: Past Medical History:  Diagnosis Date  Arthritis  GERD (gastroesophageal reflux disease)  History of cancer   There is no problem list on file for this patient.  Past Surgical History:  Procedure Laterality Date  APPENDECTOMY  CATARACT EXTRACTION  HYSTERECTOMY    Allergies  Allergen Reactions  Sulfa (Sulfonamide Antibiotics) Unknown   Current Outpatient Medications on File Prior to Visit  Medication Sig Dispense Refill  cholecalciferol (VITAMIN D3) 2,000 unit capsule  cyanocobalamin (VITAMIN B12) 1000 MCG tablet Take by mouth  fluticasone (FLOVENT HFA) 110 mcg/actuation inhaler  gabapentin (NEURONTIN) 100 MG capsule Take 2 tablets in the am and take 2 in the pm as directed  latanoprost (XALATAN) 0.005 % ophthalmic solution  *calcium carbonate oral 1 tab by mouth 3 times a day  *omega-3 fatty acids oral  aspirin 81 mg tablet 1 tab by mouth daily  multivitamin (THERAGRAN) tablet 1 tab by mouth 2 times a day  simvastatin (ZOCOR) 40 MG tablet 1 tab by mouth daily   No current facility-administered medications on file prior to visit.   History reviewed. No pertinent family history.   Social History   Tobacco Use  Smoking Status Unknown  Smokeless Tobacco Never    Social History   Socioeconomic History  Marital status: Single  Tobacco Use  Smoking status: Unknown  Smokeless  tobacco: Never   Objective:  There were no vitals filed for this visit.  There is no height or weight on file to calculate BMI.  Physical Exam Cardiovascular:  Rate and Rhythm: Normal rate.  Pulmonary:  Effort: Pulmonary effort is normal.  Breath sounds: No stridor.  Chest:  Breasts: Right: Normal.  Left: Normal.  Musculoskeletal:  Cervical back: Normal range of motion.  Lymphadenopathy:  Upper Body:  Right upper body: No supraclavicular or axillary adenopathy.  Left upper body: No supraclavicular or axillary adenopathy.  Neurological:  Mental Status: She is alert.  Psychiatric:  Mood and Affect: Mood normal.  Behavior: Behavior normal.    Labs, Imaging and Diagnostic Testing:  Ultrasound reveals a 1.4 cm calcifications in the upper inner and upper central left breast. Core biopsy showed DCIS, ER positive PR positive HER2/neu negative.  Assessment and Plan:   Diagnoses and all orders for this visit:  Ductal carcinoma in situ (DCIS) of left breast    Discussed breast conserving surgery versus mastectomy and reconstruction. She is opted for left breast seed localized lumpectomy.The procedure has been discussed with the patient. Alternatives to surgery have been discussed with the patient. Risks of surgery include bleeding, Infection, Seroma formation, death, and the need for further surgery. The patient understands and wishes to proceed.   Discussed other treatment options such as mastectomy and reconstruction. Discussed long-term survival, local regional recurrence and complications of each. She has agreed to proceed with breast conserving surgery.  No follow-ups on file.  [place],  MD

## 2021-10-28 NOTE — Anesthesia Procedure Notes (Signed)
Procedure Name: LMA Insertion Date/Time: 10/28/2021 10:15 AM  Performed by: Minerva Ends, CRNAPre-anesthesia Checklist: Patient identified, Emergency Drugs available, Suction available and Patient being monitored Patient Re-evaluated:Patient Re-evaluated prior to induction Oxygen Delivery Method: Circle system utilized Preoxygenation: Pre-oxygenation with 100% oxygen Induction Type: IV induction Ventilation: Mask ventilation without difficulty LMA: LMA inserted LMA Size: 4.0 Tube type: Oral Number of attempts: 1 Placement Confirmation: positive ETCO2 and breath sounds checked- equal and bilateral Tube secured with: Tape Dental Injury: Teeth and Oropharynx as per pre-operative assessment

## 2021-10-29 ENCOUNTER — Encounter (HOSPITAL_COMMUNITY): Payer: Self-pay | Admitting: Surgery

## 2021-10-29 LAB — SURGICAL PATHOLOGY

## 2021-10-29 NOTE — Anesthesia Postprocedure Evaluation (Signed)
Anesthesia Post Note  Patient: Ruth Wolfe  Procedure(s) Performed: LEFT BREAST SEED LUMPECTOMY (Left: Breast)     Patient location during evaluation: PACU Anesthesia Type: General Level of consciousness: awake and alert Pain management: pain level controlled Vital Signs Assessment: post-procedure vital signs reviewed and stable Respiratory status: spontaneous breathing, nonlabored ventilation and respiratory function stable Cardiovascular status: blood pressure returned to baseline and stable Postop Assessment: no apparent nausea or vomiting Anesthetic complications: no   No notable events documented.  Last Vitals:  Vitals:   10/28/21 1105 10/28/21 1130  BP: 111/65 125/74  Pulse: 74 62  Resp: 20 13  Temp: 36.4 C 36.5 C  SpO2: 95% 97%    Last Pain:  Vitals:   10/28/21 1130  TempSrc:   PainSc: 3                  Baldomero Mirarchi

## 2021-11-03 ENCOUNTER — Encounter: Payer: Self-pay | Admitting: Surgery

## 2021-11-04 ENCOUNTER — Encounter (HOSPITAL_COMMUNITY): Payer: Self-pay

## 2021-11-04 ENCOUNTER — Encounter: Payer: Self-pay | Admitting: *Deleted

## 2021-11-06 NOTE — Progress Notes (Signed)
Patient Care Team: Marrian Salvage, [place] as PCP - General (Internal Medicine) Mauro Kaufmann, RN as Oncology Nurse Navigator Rockwell Germany, RN as Oncology Nurse Navigator Erroll Luna, MD as Consulting Physician (General Surgery) Nicholas Lose, MD as Consulting Physician (Hematology and Oncology) Gery Pray, MD as Consulting Physician (Radiation Oncology)  DIAGNOSIS: No diagnosis found.  SUMMARY OF ONCOLOGIC HISTORY: Oncology History  Ductal carcinoma in situ (DCIS) of left breast  09/22/2021 Initial Diagnosis   Screening mammogram detected left breast calcifications UOQ 1 cm, stereotactic biopsy revealed intermediate grade DCIS, ER/PR positive   09/30/2021 Cancer Staging   Staging form: Breast, AJCC 8th Edition - Clinical: Stage 0 (cTis (DCIS), cN0, cM0, G2, ER+, PR+, HER2: Not Assessed) - Signed by Nicholas Lose, MD on 09/30/2021 Stage prefix: Initial diagnosis Histologic grading system: 3 grade system     CHIEF COMPLIANT: Follow-up after surgery   INTERVAL HISTORY: Ruth Wolfe is a 76 y.o. female is here because of recent diagnosis of left breast cancer. She presents to the clinic today for a follow-up.    ALLERGIES:  is allergic to sulfa antibiotics.  MEDICATIONS:  Current Outpatient Medications  Medication Sig Dispense Refill   acetaminophen (TYLENOL) 500 MG tablet Take 1,000 mg by mouth every 6 (six) hours as needed for moderate pain.     Cholecalciferol (VITAMIN D) 50 MCG (2000 UT) tablet Take 4,000 Units by mouth daily.     fluticasone (FLONASE) 50 MCG/ACT nasal spray Place 2 sprays into both nostrils daily. (Patient taking differently: Place 2 sprays into both nostrils daily as needed for allergies.) 16 g 6   fluticasone-salmeterol (WIXELA INHUB) 250-50 MCG/ACT AEPB Inhale 1 puff into the lungs in the morning and at bedtime. (Patient taking differently: Inhale 1 puff into the lungs at bedtime.) 3 each 3   gabapentin (NEURONTIN) 100 MG capsule Take 2  tablets in the am and take 2 in the pm as directed (Patient taking differently: Take 400 mg by mouth at bedtime.) 360 capsule 1   oxyCODONE (OXY IR/ROXICODONE) 5 MG immediate release tablet Take 1 tablet (5 mg total) by mouth every 6 (six) hours as needed for severe pain. 15 tablet 0   Polyethyl Glycol-Propyl Glycol (SYSTANE OP) Place 1 drop into both eyes 2 (two) times daily.     vitamin B-12 (CYANOCOBALAMIN) 1000 MCG tablet Take 1,000 mcg by mouth daily.     No current facility-administered medications for this visit.    PHYSICAL EXAMINATION: ECOG PERFORMANCE STATUS: {CHL ONC ECOG PS:[phone]}  There were no vitals filed for this visit. There were no vitals filed for this visit.  BREAST:*** No palpable masses or nodules in either right or left breasts. No palpable axillary supraclavicular or infraclavicular adenopathy no breast tenderness or nipple discharge. (exam performed in the presence of a chaperone)  LABORATORY DATA:  I have reviewed the data as listed    Latest Ref Rng & Units 09/30/2021   12:30 PM 07/07/2021   10:12 AM 06/24/2020   10:54 AM  CMP  Glucose 70 - 99 mg/dL 97  90  93   BUN 8 - 23 mg/dL _0 Creatinine 0.44 - 1.00 mg/dL 0.81  0.82  0.75   Sodium 135 - 145 mmol/L 138  137  141   Potassium 3.5 - 5.1 mmol/L 3.9  4.2  4.6   Chloride 98 - 111 mmol/L 103  100  106   CO2 22 - 32 mmol/L 27  28  29   Calcium 8.9 - 10.3 mg/dL 9.4  9.2  9.0   Total Protein 6.5 - 8.1 g/dL 7.3  6.8  6.4   Total Bilirubin 0.3 - 1.2 mg/dL 1.1  1.1  1.1   Alkaline Phos 38 - 126 U/L 74  72  70   AST 15 - 41 U/L _0 ALT 0 - 44 U/L _1 Lab Results  Component Value Date   WBC 7.9 09/30/2021   HGB 13.6 09/30/2021   HCT 40.5 09/30/2021   MCV 99.0 09/30/2021   PLT 250 09/30/2021   NEUTROABS 4.8 09/30/2021    ASSESSMENT & PLAN:  No problem-specific Assessment & Plan notes found for this encounter.    No orders of the defined types were placed in this  encounter.  The patient has a good understanding of the overall plan. she agrees with it. she will call with any problems that may develop before the next visit here. Total time spent: 30 mins including face to face time and time spent for planning, charting and co-ordination of care   Suzzette Righter, [place] 11/06/21    I Gardiner Coins am scribing for Dr. Lindi Adie  ***

## 2021-11-09 DIAGNOSIS — M7061 Trochanteric bursitis, right hip: Secondary | ICD-10-CM | POA: Diagnosis not present

## 2021-11-09 DIAGNOSIS — M545 Low back pain, unspecified: Secondary | ICD-10-CM | POA: Diagnosis not present

## 2021-11-10 ENCOUNTER — Other Ambulatory Visit: Payer: Self-pay

## 2021-11-10 ENCOUNTER — Inpatient Hospital Stay: Payer: Medicare Other | Attending: Hematology and Oncology | Admitting: Hematology and Oncology

## 2021-11-10 ENCOUNTER — Encounter: Payer: Self-pay | Admitting: *Deleted

## 2021-11-10 DIAGNOSIS — Z882 Allergy status to sulfonamides status: Secondary | ICD-10-CM | POA: Insufficient documentation

## 2021-11-10 DIAGNOSIS — D0512 Intraductal carcinoma in situ of left breast: Secondary | ICD-10-CM | POA: Diagnosis not present

## 2021-11-10 DIAGNOSIS — Z79899 Other long term (current) drug therapy: Secondary | ICD-10-CM | POA: Diagnosis not present

## 2021-11-10 MED ORDER — TAMOXIFEN CITRATE 20 MG PO TABS: 20.0000 mg | ORAL_TABLET | Freq: Every day | ORAL | 3 refills | Status: DC

## 2021-11-10 NOTE — Assessment & Plan Note (Addendum)
10/28/2021:Left lumpectomy: Area of fibrosis, fat necrosis and hemorrhage consistent with the biopsy changes, benign, no residual DCIS identified (final size is 0.2 cm based on the biopsy) previously ER/PR positive  Pathology counseling: I discussed the final pathology report of the patient provided  a copy of this report. I discussed the margins.  We also discussed the final staging along with previously performed ER/PR testing.  Treatment plan: 1. +/-  adjuvant radiation therapy (patient is not interested in doing radiation) 2. Followed by antiestrogen therapy with tamoxifen 5 years    We discussed the risks and benefits of tamoxifen. These include but not limited to insomnia, hot flashes, mood changes, vaginal dryness, and weight gain. Although rare, serious side effects including risk of blood clots were also discussed. We strongly believe that the benefits far outweigh the risks. Patient understands these risks and consented to starting treatment. Planned treatment duration is 5 years.  She will start up to 10 mg daily and then increase it to 20 mg if she tolerates it well. Telephone visit in 1 month to discuss tolerance to tamoxifen at half dose

## 2021-11-18 DIAGNOSIS — M542 Cervicalgia: Secondary | ICD-10-CM | POA: Diagnosis not present

## 2021-11-18 DIAGNOSIS — M4802 Spinal stenosis, cervical region: Secondary | ICD-10-CM | POA: Diagnosis not present

## 2021-11-19 ENCOUNTER — Telehealth: Payer: Self-pay | Admitting: *Deleted

## 2021-11-19 NOTE — Telephone Encounter (Signed)
Patient called with concern of burning with urination and discharge. States urine clear. Discharge not visible, but noticed small area fabric of underwear slightly stiff. She states she started Tamoxifen about a week ago and wondered if it might be a side effect.  Advised her that Tamoxifen can cause dryness in urethral tissues with similar symptoms, but that she should first contact PCP w/symptoms and ask if she can have urine tested for UTI If  she has a UTI, her PCP can treat.  Asked her to please contact this office if she does not have a UTI or if PCP unable to provide urine test. She verbalized understanding.

## 2021-11-20 ENCOUNTER — Ambulatory Visit (INDEPENDENT_AMBULATORY_CARE_PROVIDER_SITE_OTHER): Payer: Medicare Other | Admitting: Family

## 2021-11-20 ENCOUNTER — Ambulatory Visit: Payer: Self-pay | Admitting: Surgery

## 2021-11-20 ENCOUNTER — Encounter: Payer: Self-pay | Admitting: Family

## 2021-11-20 ENCOUNTER — Other Ambulatory Visit (HOSPITAL_COMMUNITY)
Admission: RE | Admit: 2021-11-20 | Discharge: 2021-11-20 | Disposition: A | Discharge status: Home / Self Care | Payer: Medicare Other | Source: Ambulatory Visit | Attending: Family | Admitting: Family

## 2021-11-20 VITALS — BP 136/84 | HR 77 | Temp 98.2°F | Ht 62.0 in | Wt 157.8 lb

## 2021-11-20 DIAGNOSIS — N760 Acute vaginitis: Secondary | ICD-10-CM | POA: Diagnosis not present

## 2021-11-20 DIAGNOSIS — R300 Dysuria: Secondary | ICD-10-CM | POA: Insufficient documentation

## 2021-11-20 MED ORDER — NITROFURANTOIN MONOHYD MACRO 100 MG PO CAPS: 100.0000 mg | ORAL_CAPSULE | Freq: Two times a day (BID) | ORAL | 0 refills | Status: DC

## 2021-11-20 MED ORDER — FLUCONAZOLE 150 MG PO TABS: ORAL_TABLET | ORAL | 0 refills | Status: DC

## 2021-11-20 NOTE — Progress Notes (Signed)
Ruth Wolfe is a 76 y.o. 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     Cholecalciferol (VITAMIN D) 50 MCG (2000 UT) tablet Take 4,000 Units by mouth daily.     fluconazole (DIFLUCAN) 150 MG tablet Take 1 tablet today; repeat after 72 hours 2 tablet 0   fluticasone (FLONASE) 50 MCG/ACT nasal spray Place 2 sprays into both nostrils daily. (Patient taking differently: Place 2 sprays into both nostrils daily as needed for allergies.) 16 g 6   fluticasone-salmeterol (WIXELA INHUB) 250-50 MCG/ACT AEPB Inhale 1 puff into the lungs in the morning and at bedtime. (Patient taking differently: Inhale 1 puff into the lungs at bedtime.) 3 each 3   gabapentin (NEURONTIN) 100 MG capsule Take 2 tablets in the am and take 2 in the pm as directed (Patient taking differently: Take 400 mg by mouth at bedtime.) 360 capsule 1   nitrofurantoin, macrocrystal-monohydrate, (MACROBID) 100 MG capsule Take 1 capsule (100 mg total) by mouth 2 (two) times daily. 10 capsule 0   Polyethyl  Glycol-Propyl Glycol (SYSTANE OP) Place 1 drop into both eyes 2 (two) times daily.     tamoxifen (NOLVADEX) 20 MG tablet Take 1 tablet (20 mg total) by mouth daily. 90 tablet 3   vitamin B-12 (CYANOCOBALAMIN) 1000 MCG tablet Take 1,000 mcg by mouth daily.     No current facility-administered medications for this visit.    Allergies: Sulfa antibiotics  Past Medical History:  Diagnosis Date   Allergy 2013   Arthritis 1980   Asthma 2000   Breast cancer ([place]) 09/22/2021   DCIS   Bruit    Per patient   Cataracts, both eyes 2021   surgery 2022   Environmental allergies 2013   Fibromyalgia    GERD (gastroesophageal reflux disease) 2013   Glaucoma 2016   POAG OU   History of hiatal hernia    Hyperlipidemia 2014   Hypertensive retinopathy 2021   OU   Macular degeneration 2014   Neuropathy 2013   legs and arms   Osteopenia 2000   Paralyzed vocal cords 2013   Per patient   Tongue atrophy 2013   Per patient r/t paralyzed vocal cords   Vitamin B12 deficiency 2021   Vitamin D deficiency 2021    Past Surgical History:  Procedure Laterality Date   APPENDECTOMY     BREAST LUMPECTOMY WITH RADIOACTIVE SEED LOCALIZATION Left 10/28/2021   Procedure: LEFT BREAST SEED LUMPECTOMY;  Surgeon: Erroll Luna, MD;  Location: [place];  Service: General;  Laterality: Left;   CATARACT EXTRACTION  CERVICAL SPINE SURGERY  01/30/2020   Per patient   TOTAL ABDOMINAL HYSTERECTOMY  1981    Family History  Problem Relation Age of Onset   Hypertension Mother    Lung cancer Father        he smoked   Glaucoma Father    Gout Father    Hypertension Sister    Liver disease Sister    Brain cancer Cousin        glioblastoma, paternal first cousin   Colon cancer Neg Hx    Stomach cancer Neg Hx     Social History   Tobacco Use   Smoking status: Former    Types: Cigarettes   Smokeless tobacco: Never  Substance Use Topics   Alcohol use: Yes    Alcohol/week: 5.0 - 6.0 standard drinks of alcohol     Types: 5 - 6 Cans of beer per week    Subjective:   Patient concerned for 2 day history of burning with urination/ vaginal discharge; does note that has started Tamoxifen in the past 2 week; no fever;     Objective:  Vitals:   11/20/21 1553  BP: 136/84  Pulse: 77  Temp: 98.2 F (36.8 C)  TempSrc: Oral  SpO2: 98%  Weight: 157 lb 12.8 oz (71.6 kg)  Height: '5\' 2"'$  (1.575 m)    General: Well developed, well nourished, in no acute distress  Skin : Warm and dry.  Head: Normocephalic and atraumatic  Lungs: Respirations unlabored;  Neurologic: Alert and oriented; speech intact; face symmetrical; moves all extremities well; CNII-XII intact without focal deficit  Assessment:  1. Dysuria   2. Acute vaginitis     Plan:  Concern for possible yeast infection; will check urine culture and vaginal swab; due to upcoming weekend, will cover for UTI and yeast infection; follow up to be determined.   No follow-ups on file.  Orders Placed This Encounter  Procedures   Urine Culture    Requested Prescriptions   Signed Prescriptions Disp Refills   nitrofurantoin, macrocrystal-monohydrate, (MACROBID) 100 MG capsule 10 capsule 0    Sig: Take 1 capsule (100 mg total) by mouth 2 (two) times daily.   fluconazole (DIFLUCAN) 150 MG tablet 2 tablet 0    Sig: Take 1 tablet today; repeat after 72 hours

## 2021-11-22 LAB — URINE CULTURE
MICRO NUMBER:: 13955951
SPECIMEN QUALITY:: ADEQUATE

## 2021-11-23 LAB — CERVICOVAGINAL ANCILLARY ONLY
Bacterial Vaginitis (gardnerella): NEGATIVE
Candida Glabrata: NEGATIVE
Candida Vaginitis: NEGATIVE
Chlamydia: NEGATIVE
Comment: NEGATIVE
Comment: NEGATIVE
Comment: NEGATIVE
Comment: NEGATIVE
Comment: NEGATIVE
Comment: NORMAL
Neisseria Gonorrhea: NEGATIVE
Trichomonas: NEGATIVE

## 2021-12-08 ENCOUNTER — Telehealth: Payer: Medicare Other | Admitting: Hematology and Oncology

## 2021-12-10 ENCOUNTER — Inpatient Hospital Stay: Payer: Medicare Other | Admitting: Adult Health

## 2021-12-17 DIAGNOSIS — Z23 Encounter for immunization: Secondary | ICD-10-CM | POA: Diagnosis not present

## 2021-12-18 ENCOUNTER — Encounter: Payer: Self-pay | Admitting: *Deleted

## 2021-12-29 DIAGNOSIS — Z23 Encounter for immunization: Secondary | ICD-10-CM | POA: Diagnosis not present

## 2022-01-18 ENCOUNTER — Other Ambulatory Visit: Payer: Self-pay | Admitting: Family

## 2022-02-09 ENCOUNTER — Telehealth: Payer: Self-pay | Admitting: Adult Health

## 2022-02-09 NOTE — Telephone Encounter (Signed)
Rescheduled appointment per room/resource. Left voicemail. 

## 2022-02-11 ENCOUNTER — Inpatient Hospital Stay: Payer: Medicare Other | Admitting: Adult Health

## 2022-02-11 ENCOUNTER — Other Ambulatory Visit: Payer: Self-pay

## 2022-02-11 ENCOUNTER — Encounter: Payer: Self-pay | Admitting: Adult Health

## 2022-02-11 ENCOUNTER — Inpatient Hospital Stay: Payer: Medicare Other | Attending: Hematology and Oncology | Admitting: Adult Health

## 2022-02-11 VITALS — BP 127/79 | HR 85 | Temp 97.3°F | Resp 18 | Wt 157.2 lb

## 2022-02-11 DIAGNOSIS — M8589 Other specified disorders of bone density and structure, multiple sites: Secondary | ICD-10-CM | POA: Diagnosis not present

## 2022-02-11 DIAGNOSIS — Z8349 Family history of other endocrine, nutritional and metabolic diseases: Secondary | ICD-10-CM | POA: Diagnosis not present

## 2022-02-11 DIAGNOSIS — Z882 Allergy status to sulfonamides status: Secondary | ICD-10-CM | POA: Diagnosis not present

## 2022-02-11 DIAGNOSIS — Z7289 Other problems related to lifestyle: Secondary | ICD-10-CM | POA: Diagnosis not present

## 2022-02-11 DIAGNOSIS — E2839 Other primary ovarian failure: Secondary | ICD-10-CM

## 2022-02-11 DIAGNOSIS — Z87891 Personal history of nicotine dependence: Secondary | ICD-10-CM | POA: Insufficient documentation

## 2022-02-11 DIAGNOSIS — Z8379 Family history of other diseases of the digestive system: Secondary | ICD-10-CM | POA: Diagnosis not present

## 2022-02-11 DIAGNOSIS — Z808 Family history of malignant neoplasm of other organs or systems: Secondary | ICD-10-CM | POA: Diagnosis not present

## 2022-02-11 DIAGNOSIS — D0512 Intraductal carcinoma in situ of left breast: Secondary | ICD-10-CM | POA: Diagnosis not present

## 2022-02-11 DIAGNOSIS — Z9049 Acquired absence of other specified parts of digestive tract: Secondary | ICD-10-CM | POA: Insufficient documentation

## 2022-02-11 DIAGNOSIS — Z83511 Family history of glaucoma: Secondary | ICD-10-CM | POA: Diagnosis not present

## 2022-02-11 DIAGNOSIS — Z170 Estrogen receptor positive status [ER+]: Secondary | ICD-10-CM | POA: Insufficient documentation

## 2022-02-11 DIAGNOSIS — Z79810 Long term (current) use of selective estrogen receptor modulators (SERMs): Secondary | ICD-10-CM | POA: Diagnosis not present

## 2022-02-11 DIAGNOSIS — Z801 Family history of malignant neoplasm of trachea, bronchus and lung: Secondary | ICD-10-CM | POA: Insufficient documentation

## 2022-02-11 DIAGNOSIS — Z8249 Family history of ischemic heart disease and other diseases of the circulatory system: Secondary | ICD-10-CM | POA: Diagnosis not present

## 2022-02-15 DIAGNOSIS — H401122 Primary open-angle glaucoma, left eye, moderate stage: Secondary | ICD-10-CM | POA: Diagnosis not present

## 2022-02-15 DIAGNOSIS — H04123 Dry eye syndrome of bilateral lacrimal glands: Secondary | ICD-10-CM | POA: Diagnosis not present

## 2022-02-15 DIAGNOSIS — M3500 Sicca syndrome, unspecified: Secondary | ICD-10-CM | POA: Diagnosis not present

## 2022-02-15 DIAGNOSIS — H401111 Primary open-angle glaucoma, right eye, mild stage: Secondary | ICD-10-CM | POA: Diagnosis not present

## 2022-02-16 NOTE — Progress Notes (Signed)
SURVIVORSHIP VISIT:   BRIEF ONCOLOGIC HISTORY:  Oncology History  Ductal carcinoma in situ (DCIS) of left breast  09/22/2021 Initial Diagnosis   Screening mammogram detected left breast calcifications UOQ 1 cm, stereotactic biopsy revealed intermediate grade DCIS, ER/PR positive   09/30/2021 Cancer Staging   Staging form: Breast, AJCC 8th Edition - Clinical: Stage 0 (cTis (DCIS), cN0, cM0, G2, ER+, PR+, HER2: Not Assessed) - Signed by Nicholas Lose, MD on 09/30/2021 Stage prefix: Initial diagnosis Histologic grading system: 3 grade system   10/28/2021 Surgery   Left lumpectomy: Area of fibrosis, fat necrosis and hemorrhage consistent with the biopsy changes, benign, no residual DCIS identified   10/2021 -  Anti-estrogen oral therapy   Tamoxifen 5 years       INTERVAL HISTORY:  Ruth Wolfe to review her survivorship care plan detailing her treatment course for breast cancer, as well as monitoring long-term side effects of that treatment, education regarding health maintenance, screening, and overall wellness and health promotion.     Overall, Ruth Wolfe reports feeling moderately well.  She tells me that one to two weeks after starting tamoxifen in September she developed vaginal discharge.  She felt she had a UTI, went to MD and was diagnosed with vaginal candida infection.  The discharge has continued.  She has experienced gas and her stomach has felt as if she swallowed cement, and her bowel movements have changed.  She stopped the pill on 12/6 and her GI symptoms have improved somewhat.    REVIEW OF SYSTEMS:  Review of Systems  Constitutional:  Negative for appetite change, chills, fatigue, fever and unexpected weight change.  HENT:   Negative for hearing loss, lump/mass and trouble swallowing.   Eyes:  Negative for eye problems and icterus.  Respiratory:  Negative for chest tightness, cough and shortness of breath.   Cardiovascular:  Negative for chest pain, leg swelling and palpitations.   Gastrointestinal:  Negative for abdominal distention, abdominal pain, constipation, diarrhea, nausea and vomiting.  Endocrine: Negative for hot flashes.  Genitourinary:  Negative for difficulty urinating.   Musculoskeletal:  Negative for arthralgias.  Skin:  Negative for itching and rash.  Neurological:  Negative for dizziness, extremity weakness, headaches and numbness.  Hematological:  Negative for adenopathy. Does not bruise/bleed easily.  Psychiatric/Behavioral:  Negative for depression. The patient is not nervous/anxious.    Breast: Denies any new nodularity, masses, tenderness, nipple changes, or nipple discharge.          PAST MEDICAL/SURGICAL HISTORY:  Past Medical History:  Diagnosis Date   Allergy 2013   Arthritis 1980   Asthma 2000   Breast cancer ([place]) 09/22/2021   DCIS   Bruit    Per patient   Cataracts, both eyes 2021   surgery 2022   Environmental allergies 2013   Fibromyalgia    GERD (gastroesophageal reflux disease) 2013   Glaucoma 2016   POAG OU   History of hiatal hernia    Hyperlipidemia 2014   Hypertensive retinopathy 2021   OU   Macular degeneration 2014   Neuropathy 2013   legs and arms   Osteopenia 2000   Paralyzed vocal cords 2013   Per patient   Tongue atrophy 2013   Per patient r/t paralyzed vocal cords   Vitamin B12 deficiency 2021   Vitamin D deficiency 2021   Past Surgical History:  Procedure Laterality Date   APPENDECTOMY     BREAST LUMPECTOMY WITH RADIOACTIVE SEED LOCALIZATION Left 10/28/2021   Procedure: LEFT BREAST SEED  LUMPECTOMY;  Surgeon: Erroll Luna, MD;  Location: [place];  Service: General;  Laterality: Left;   CATARACT EXTRACTION     CERVICAL SPINE SURGERY  01/30/2020   Per patient   TOTAL ABDOMINAL HYSTERECTOMY  1981     ALLERGIES:  Allergies  Allergen Reactions   Misc. Sulfonamide Containing Compounds Other (See Comments)   Sulfa Antibiotics     Wheezing, swelling face and ears     CURRENT MEDICATIONS:   Outpatient Encounter Medications as of 02/11/2022  Medication Sig   acetaminophen (TYLENOL) 500 MG tablet Take 1,000 mg by mouth every 6 (six) hours as needed for moderate pain.   Cholecalciferol (VITAMIN D) 50 MCG (2000 UT) tablet Take 4,000 Units by mouth daily.   fluticasone (FLONASE) 50 MCG/ACT nasal spray SPRAY 2 SPRAYS INTO EACH NOSTRIL EVERY DAY   fluticasone-salmeterol (WIXELA INHUB) 250-50 MCG/ACT AEPB Inhale 1 puff into the lungs in the morning and at bedtime. (Patient taking differently: Inhale 1 puff into the lungs at bedtime.)   Polyethyl Glycol-Propyl Glycol (SYSTANE OP) Place 1 drop into both eyes 2 (two) times daily.   vitamin B-12 (CYANOCOBALAMIN) 1000 MCG tablet Take 1,000 mcg by mouth daily.   gabapentin (NEURONTIN) 100 MG capsule Take 2 tablets in the am and take 2 in the pm as directed (Patient not taking: Reported on 02/11/2022)   [DISCONTINUED] fluconazole (DIFLUCAN) 150 MG tablet Take 1 tablet today; repeat after 72 hours (Patient not taking: Reported on 02/11/2022)   [DISCONTINUED] nitrofurantoin, macrocrystal-monohydrate, (MACROBID) 100 MG capsule Take 1 capsule (100 mg total) by mouth 2 (two) times daily. (Patient not taking: Reported on 02/11/2022)   [DISCONTINUED] tamoxifen (NOLVADEX) 20 MG tablet Take 1 tablet (20 mg total) by mouth daily. (Patient not taking: Reported on 02/11/2022)   No facility-administered encounter medications on file as of 02/11/2022.     ONCOLOGIC FAMILY HISTORY:  Family History  Problem Relation Age of Onset   Hypertension Mother    Lung cancer Father        he smoked   Glaucoma Father    Gout Father    Hypertension Sister    Liver disease Sister    Brain cancer Cousin        glioblastoma, paternal first cousin   Colon cancer Neg Hx    Stomach cancer Neg Hx      SOCIAL HISTORY:  Social History   Socioeconomic History   Marital status: Divorced    Spouse name: Not on file   Number of children: Not on file   Years of  education: Not on file   Highest education level: Not on file  Occupational History   Not on file  Tobacco Use   Smoking status: Former    Types: Cigarettes   Smokeless tobacco: Never  Vaping Use   Vaping Use: Never used  Substance and Sexual Activity   Alcohol use: Yes    Alcohol/week: 5.0 - 6.0 standard drinks of alcohol    Types: 5 - 6 Cans of beer per week   Drug use: No   Sexual activity: Not on file  Other Topics Concern   Not on file  Social History Narrative   Not on file   Social Determinants of Health   Financial Resource Strain: Low Risk  (07/02/2021)   Overall Financial Resource Strain (CARDIA)    Difficulty of Paying Living Expenses: Not hard at all  Food Insecurity: No Food Insecurity (07/02/2021)   Hunger Vital Sign    Worried About  Running Out of Food in the Last Year: Never true    [place] in the Last Year: Never true  Transportation Needs: No Transportation Needs (07/02/2021)   PRAPARE - Hydrologist (Medical): No    Lack of Transportation (Non-Medical): No  Physical Activity: Sufficiently Active (07/02/2021)   Exercise Vital Sign    Days of Exercise per Week: 3 days    Minutes of Exercise per Session: 50 min  Stress: No Stress Concern Present (07/02/2021)   [place]    Feeling of Stress : Not at all  Social Connections: Not on file  Intimate Partner Violence: Not on file     OBSERVATIONS/OBJECTIVE:  BP 127/79 (BP Location: Left Arm, Patient Position: Sitting)   Pulse 85   Temp (!) 97.3 F (36.3 C) (Temporal)   Resp 18   Wt 157 lb 3 oz (71.3 kg)   SpO2 100%   BMI 28.75 kg/m  GENERAL: Patient is a well appearing female in no acute distress HEENT:  Sclerae anicteric.  Oropharynx clear and moist. No ulcerations or evidence of oropharyngeal candidiasis. Neck is supple.  NODES:  No cervical, supraclavicular, or axillary lymphadenopathy palpated.  BREAST  EXAM:  Deferred. LUNGS:  Clear to auscultation bilaterally.  No wheezes or rhonchi. HEART:  Regular rate and rhythm. No murmur appreciated. ABDOMEN:  Soft, nontender.  Positive, normoactive bowel sounds. No organomegaly palpated. MSK:  No focal spinal tenderness to palpation. Full range of motion bilaterally in the upper extremities. EXTREMITIES:  No peripheral edema.   SKIN:  Clear with no obvious rashes or skin changes. No nail dyscrasia. NEURO:  Nonfocal. Well oriented.  Appropriate affect.   LABORATORY DATA:  None for this visit.  DIAGNOSTIC IMAGING:  None for this visit.      ASSESSMENT AND PLAN:  Ms.. Wolfe is a pleasant 76 y.o. female with Stage 0 left breast DCIS, ER+/PR+, diagnosed in 09/2021, treated with lumpectomy, adjuvant radiation therapy.  She presents to the Survivorship Clinic for our initial meeting and routine follow-up post-completion of treatment for breast cancer.    1. Stage 0 left breast cancer:  Ruth Wolfe is continuing to recover from definitive treatment for breast cancer.  She is experiencing difficulty in tolerating the tamoxifen and has stopped taking the therapy.  We discussed the option of anastrozole or foregoing antiestrogen therapy altogether decided that we will revisit this subject in January 2024 after the holidays.  Her mammogram is due July 2024; orders placed today. Today, a comprehensive survivorship care plan and treatment summary was reviewed with the patient today detailing her breast cancer diagnosis, treatment course, potential late/long-term effects of treatment, appropriate follow-up care with recommendations for the future, and patient education resources.  A copy of this summary, along with a letter will be sent to the patient's primary care provider via mail/fax/In Basket message after today's visit.    2. Bone health:  Given Ruth Wolfe's age/history of breast cancer and the potential treatment regimen including anti-estrogen therapy with  aromatase inhibitors, she is at risk for bone demineralization.  She cannot recall her last bone density testing, therefore I placed orders for this to occur in the next few weeks.  She was given education on specific activities to promote bone health.  3. Cancer screening:  Due to Ruth Wolfe's history and her age, she should receive screening for skin cancers, colon cancer, and gynecologic cancers.  The information and  recommendations are listed on the patient's comprehensive care plan/treatment summary and were reviewed in detail with the patient.    4. Health maintenance and wellness promotion: Ruth Wolfe was encouraged to consume 5-7 servings of fruits and vegetables per day. We reviewed the "Nutrition Rainbow" handout.  She was also encouraged to engage in moderate to vigorous exercise for 30 minutes per day most days of the week. We discussed the [employer] fitness program, which is designed for cancer survivors to help them become more physically fit after cancer treatments.  She was instructed to limit her alcohol consumption and continue to abstain from tobacco use.     5. Support services/counseling: It is not uncommon for this period of the patient's cancer care trajectory to be one of many emotions and stressors.  She was given information regarding our available services and encouraged to contact me with any questions or for help enrolling in any of our support group/programs.    Follow up instructions:    -Return to cancer center in 4 weeks for follow-up -Mammogram due in July 2024 -Bone density testing in the next 4 weeks. -She is welcome to return back to the Survivorship Clinic at any time; no additional follow-up needed at this time.  -Consider referral back to survivorship as a long-term survivor for continued surveillance  The patient was provided an opportunity to ask questions and all were answered. The patient agreed with the plan and demonstrated an understanding of the  instructions.   Total encounter time:40 minutes*in face-to-face visit time, chart review, lab review, care coordination, order entry, and documentation of the encounter time.   Wilber Bihari, NP 02/16/22 1:32 PM Medical Oncology and Hematology [facility] [place], [place] [address] Tel. [phone]    Fax. [phone]  *Total Encounter Time as defined by the Centers for Medicare and Medicaid Services includes, in addition to the face-to-face time of a patient visit (documented in the note above) non-face-to-face time: obtaining and reviewing outside history, ordering and reviewing medications, tests or procedures, care coordination (communications with other health care professionals or caregivers) and documentation in the medical record.

## 2022-02-24 ENCOUNTER — Ambulatory Visit (HOSPITAL_BASED_OUTPATIENT_CLINIC_OR_DEPARTMENT_OTHER)
Admission: RE | Admit: 2022-02-24 | Discharge: 2022-02-24 | Disposition: A | Discharge status: Home / Self Care | Payer: Medicare Other | Source: Ambulatory Visit | Attending: Adult Health | Admitting: Adult Health

## 2022-02-24 ENCOUNTER — Telehealth: Payer: Self-pay

## 2022-02-24 DIAGNOSIS — E2839 Other primary ovarian failure: Secondary | ICD-10-CM | POA: Diagnosis not present

## 2022-02-24 DIAGNOSIS — M8589 Other specified disorders of bone density and structure, multiple sites: Secondary | ICD-10-CM | POA: Insufficient documentation

## 2022-02-24 DIAGNOSIS — M810 Age-related osteoporosis without current pathological fracture: Secondary | ICD-10-CM | POA: Diagnosis not present

## 2022-02-24 NOTE — Telephone Encounter (Signed)
Called and given below message, Pt verbalized understanding.

## 2022-02-24 NOTE — Telephone Encounter (Signed)
-----   Message from Gardenia Phlegm, NP sent at 02/24/2022  1:14 PM EST ----- Bone density shows osteoporosis.  Please let her know that we can discuss further at her next appt ----- Message ----- From: Interface, Rad Results In Sent: 02/24/2022  11:35 AM EST To: Gardenia Phlegm, NP

## 2022-02-25 NOTE — Progress Notes (Signed)
[facility] Note  03/09/2022     CHIEF COMPLAINT Patient presents for Retina Follow Up    HISTORY OF PRESENT ILLNESS: Ruth Wolfe is a 76 y.o. female who presents to the clinic today for:   HPI     Retina Follow Up   Patient presents with  Other.  In both eyes.  This started 6 months ago.  I, the attending physician,  performed the HPI with the patient and updated documentation appropriately.        Comments   Patient here for 6 months retina follow up for VMT OU. Patient states vision can still read and drive. Eyes get tired. The glaucoma has come back in OS. Was put on Dorzolamide BID OS. Been on for 2 weeks. Sees little flashes of light OS lower area temporally. DX with osteoporosis a month ago. Taking melatonin 5 mg daily.       Last edited by Bernarda Caffey, MD on 03/09/2022  4:40 PM.    Patient states Dr. Lucianne Lei told her she has glaucoma OS, she is using dorzolamide BID OS only   Referring physician: Lisabeth Pick, MD [address] [place],  [place] [address]  HISTORICAL INFORMATION:  Selected notes from the MEDICAL RECORD NUMBER Referred by Dr. Quentin Ore for concern of VMT OU LEE: 03.18.21 Read Drivers) [BCVA: OD: 20/30-- OS: 20/50] Ocular Hx-POAG OU, VMT OU, DES OU, cataracts OU, HTN ret OU PMH-HLD   CURRENT MEDICATIONS: Current Outpatient Medications (Ophthalmic Drugs)  Medication Sig   dorzolamide (TRUSOPT) 2 % ophthalmic solution Place 1 drop into the left eye 2 (two) times daily.   Polyethyl Glycol-Propyl Glycol (SYSTANE OP) Place 1 drop into both eyes 2 (two) times daily.   No current facility-administered medications for this visit. (Ophthalmic Drugs)   Current Outpatient Medications (Other)  Medication Sig   acetaminophen (TYLENOL) 500 MG tablet Take 1,000 mg by mouth every 6 (six) hours as needed for moderate pain.   Cholecalciferol (VITAMIN D) 50 MCG (2000 UT) tablet Take 4,000 Units by mouth daily.   fluticasone  (FLONASE) 50 MCG/ACT nasal spray SPRAY 2 SPRAYS INTO EACH NOSTRIL EVERY DAY   fluticasone-salmeterol (WIXELA INHUB) 250-50 MCG/ACT AEPB Inhale 1 puff into the lungs in the morning and at bedtime. (Patient taking differently: Inhale 1 puff into the lungs at bedtime.)   vitamin B-12 (CYANOCOBALAMIN) 1000 MCG tablet Take 1,000 mcg by mouth daily.   gabapentin (NEURONTIN) 100 MG capsule Take 2 tablets in the am and take 2 in the pm as directed (Patient not taking: Reported on 02/11/2022)   No current facility-administered medications for this visit. (Other)   REVIEW OF SYSTEMS: ROS   Positive for: Gastrointestinal, Musculoskeletal, Eyes, Respiratory Negative for: Constitutional, Neurological, Skin, Genitourinary, HENT, Endocrine, Cardiovascular, Psychiatric, Allergic/Imm, Heme/Lymph Last edited by Theodore Demark, COA on 03/09/2022  1:37 PM.     ALLERGIES Allergies  Allergen Reactions   Misc. Sulfonamide Containing Compounds Other (See Comments)   Sulfa Antibiotics     Wheezing, swelling face and ears   PAST MEDICAL HISTORY Past Medical History:  Diagnosis Date   Allergy 2013   Arthritis 1980   Asthma 2000   Breast cancer ([place]) 09/22/2021   DCIS   Bruit    Per patient   Cataracts, both eyes 2021   surgery 2022   Environmental allergies 2013   Fibromyalgia    GERD (gastroesophageal reflux disease) 2013   Glaucoma 2016   POAG OU  History of hiatal hernia    Hyperlipidemia 2014   Hypertensive retinopathy 2021   OU   Macular degeneration 2014   Neuropathy 2013   legs and arms   Osteopenia 2000   Paralyzed vocal cords 2013   Per patient   Tongue atrophy 2013   Per patient r/t paralyzed vocal cords   Vitamin B12 deficiency 2021   Vitamin D deficiency 2021   Past Surgical History:  Procedure Laterality Date   APPENDECTOMY     BREAST LUMPECTOMY WITH RADIOACTIVE SEED LOCALIZATION Left 10/28/2021   Procedure: LEFT BREAST SEED LUMPECTOMY;  Surgeon: Erroll Luna, MD;   Location: [facility];  Service: General;  Laterality: Left;   CATARACT EXTRACTION     CERVICAL SPINE SURGERY  01/30/2020   Per patient   TOTAL ABDOMINAL HYSTERECTOMY  1981   FAMILY HISTORY Family History  Problem Relation Age of Onset   Hypertension Mother    Lung cancer Father        he smoked   Glaucoma Father    Gout Father    Hypertension Sister    Liver disease Sister    Brain cancer Cousin        glioblastoma, paternal first cousin   Colon cancer Neg Hx    Stomach cancer Neg Hx    SOCIAL HISTORY Social History   Tobacco Use   Smoking status: Former    Types: Cigarettes   Smokeless tobacco: Never  Vaping Use   Vaping Use: Never used  Substance Use Topics   Alcohol use: Yes    Alcohol/week: 5.0 - 6.0 standard drinks of alcohol    Types: 5 - 6 Cans of beer per week   Drug use: No       OPHTHALMIC EXAM:  Base Eye Exam     Visual Acuity (Snellen - Linear)       Right Left   Dist cc 20/25 20/40 -2   Dist ph cc NI NI    Correction: Glasses         Tonometry (Tonopen, 1:32 PM)       Right Left   Pressure 15 13         Pupils       Dark Light Shape React APD   Right 3 2 Round Brisk None   Left 3 2 Round Brisk None         Visual Fields (Counting fingers)       Left Right    Full Full         Extraocular Movement       Right Left    Full, Ortho Full, Ortho         Neuro/Psych     Oriented x3: Yes   Mood/Affect: Normal         Dilation     Both eyes: 1.0% Mydriacyl, 2.5% Phenylephrine @ 1:32 PM           Slit Lamp and Fundus Exam     Slit Lamp Exam       Right Left   Lids/Lashes Mild Dermatochalasis - upper lid, mild MGD Mild Dermatochalasis - upper lid   Conjunctiva/Sclera White and quiet White and quiet   Cornea Arcus, trace Punctate epithelial erosions, well healed temporal cataract wounds Arcus, 1+Punctate epithelial erosions, mild tear film debris, well healed cataract wounds   Anterior Chamber deep and clear,  narrow temporal angle, no cell or flare deep and clear, narrow temporal angle   Iris Round and  dilated Round and dilated   Lens PC IOL in good position PC IOL in good position   Anterior Vitreous Vitreous syneresis Vitreous syneresis         Fundus Exam       Right Left   Disc Tilted disc, +cupping, temporal PPA, Sharp rim, +disc heme at 0700, thin inferior rim Tilted disc, +cupping, temporal PPA, mild Pallor, Sharp rim   C/D Ratio 0.7 0.6   Macula Flat, Blunted foveal reflex, central Cystic changes, Drusen, RPE mottling and clumping, no heme Flat, Blunted foveal reflex, central macular cyst -- improved, Drusen, RPE mottling and clumping, no heme, central vitelliform like lesion   Vessels attenuated, mild tortuousity, mild AV crossing changes attenuated, Tortuous   Periphery Attached, peripheral drusen, reticular degeneration, No heme Attached, peripheral drusen, reticular degeneration, No heme           Refraction     Wearing Rx       Sphere Cylinder Axis Add   Right -1.00 +1.25 022 +2.50   Left -1.50 +2.50 158 +2.50    Type: PAL           IMAGING AND PROCEDURES  Imaging and Procedures for _0 @  OCT, Retina - OU - Both Eyes       Right Eye Quality was good. Central Foveal Thickness: 385. Progression has worsened. Findings include no SRF, abnormal foveal contour, myopic contour, retinal drusen , intraretinal fluid, vitreous traction (Progression of VMT with mild interval increase in cystic changes).   Left Eye Quality was good. Central Foveal Thickness: 293. Progression has improved. Findings include no SRF, abnormal foveal contour, retinal drusen , intraretinal fluid, vitreous traction (stable release of central VMT with interval improvement in central cystic changes, stable vitelliform like lesion, partial PVD).   Notes *Images captured and stored on drive  Diagnosis / Impression:  Non-exu ARMD OU VMT with central cystic changes OU OD: Progression of VMT  with mild interval increase in cystic changes OS: stable release of central VMT with interval improvement in central cystic changes, stable vitelliform like lesion, partial PVD  Clinical management:  See below  Abbreviations: NFP - Normal foveal profile. CME - cystoid macular edema. PED - pigment epithelial detachment. IRF - intraretinal fluid. SRF - subretinal fluid. EZ - ellipsoid zone. ERM - epiretinal membrane. ORA - outer retinal atrophy. ORT - outer retinal tubulation. SRHM - subretinal hyper-reflective material            ASSESSMENT/PLAN:    ICD-10-CM   1. Vitreomacular adhesion of both eyes  H43.823 OCT, Retina - OU - Both Eyes    2. Intermediate stage nonexudative age-related macular degeneration of both eyes  H35.3132     3. Essential hypertension  I10     4. Hypertensive retinopathy of both eyes  H35.033     5. Pseudophakia, both eyes  Z96.1     6. Primary open angle glaucoma of both eyes, unspecified glaucoma stage  H40.1130     7. Dry eyes  H04.123      1. VMT with central cystic changes OU  - BCVA 20/30 OD, 20/40 -- stable  - asymptomatic, no metamorphopsia  - OCT shows OD: Progression of VMT with mild interval increase in cystic changes; OS: stable release of central VMT with interval improvement in central cystic changes, stable vitelliform like lesion, partial PVD  - discussed possibility of spontaneous release of VMT vs progression to macular hole  - no indication for surgery at this time and  pt wishes to monitor for now -- reasonable  - f/u 6 months, sooner prn - repeat DFE/OCT  2. Age related macular degeneration, non-exudative, both eyes  - stable intermediate stage disease  - The incidence, anatomy, and pathology of dry AMD, risk of progression, and the AREDS and AREDS 2 study including smoking risks discussed with patient. Patient was instructed to continue taking AREDS despite her not liking to take pills.    - recommend Amsler grid  monitoring  3,4. Hypertensive retinopathy OU  - discussed importance of tight BP control  - monitor  5. Pseudophakia OU  - s/p CE/IOL (OS: 09.01.21, OD: 09.15.21, Dr. Kathlen Mody)  - IOLs in good position, doing well  - monitor  6. POAG OU  - under the expert management of Dr. Lucianne Lei  - IOP today: 15,13  - on dorzolamide BID OS only -- will start in OD also due to new disc heme today 01.09.24  7. Dry eyes OU  - recommend artificial tears and lubricating ointment as needed   Ophthalmic Meds Ordered this visit:  No orders of the defined types were placed in this encounter.    Return in about 6 months (around 09/07/2022) for f/u VMT OU, DFE, OCT.  This document serves as a record of services personally performed by Gardiner Sleeper, MD, PhD. It was created on their behalf by Renaldo Reel, [place] an ophthalmic technician. The creation of this record is the provider's dictation and/or activities during the visit.    Electronically signed by:  Renaldo Reel, COT  12.28.23  This document serves as a record of services personally performed by Gardiner Sleeper, MD, PhD. It was created on their behalf by San Jetty. Owens Shark, OA an ophthalmic technician. The creation of this record is the provider's dictation and/or activities during the visit.    Electronically signed by: San Jetty. Owens Shark, [place] 01.09.2024 4:41 PM  Gardiner Sleeper, M.D., Ph.D. Diseases & Surgery of the Retina and Vitreous [facility]  I have reviewed the above documentation for accuracy and completeness, and I agree with the above. Gardiner Sleeper, M.D., Ph.D. 03/09/22 4:42 PM   Abbreviations: M myopia (nearsighted); A astigmatism; H hyperopia (farsighted); P presbyopia; Mrx spectacle prescription;  CTL contact lenses; OD right eye; OS left eye; OU both eyes  XT exotropia; ET esotropia; PEK punctate epithelial keratitis; PEE punctate epithelial erosions; DES dry eye syndrome; MGD meibomian gland  dysfunction; ATs artificial tears; PFAT's preservative free artificial tears; [place] nuclear sclerotic cataract; PSC posterior subcapsular cataract; ERM epi-retinal membrane; PVD posterior vitreous detachment; RD retinal detachment; DM diabetes mellitus; DR diabetic retinopathy; NPDR non-proliferative diabetic retinopathy; PDR proliferative diabetic retinopathy; CSME clinically significant macular edema; DME diabetic macular edema; dbh dot blot hemorrhages; CWS cotton wool spot; POAG primary open angle glaucoma; C/D cup-to-disc ratio; HVF humphrey visual field; GVF goldmann visual field; OCT optical coherence tomography; IOP intraocular pressure; BRVO Branch retinal vein occlusion; CRVO central retinal vein occlusion; CRAO central retinal artery occlusion; BRAO branch retinal artery occlusion; RT retinal tear; SB scleral buckle; PPV pars plana vitrectomy; VH Vitreous hemorrhage; PRP panretinal laser photocoagulation; IVK intravitreal kenalog; VMT vitreomacular traction; MH Macular hole;  NVD neovascularization of the disc; NVE neovascularization elsewhere; AREDS age related eye disease study; ARMD age related macular degeneration; POAG primary open angle glaucoma; EBMD epithelial/anterior basement membrane dystrophy; ACIOL anterior chamber intraocular lens; IOL intraocular lens; PCIOL posterior chamber intraocular lens; Phaco/IOL phacoemulsification with intraocular lens placement; PRK photorefractive keratectomy; LASIK  laser assisted in situ keratomileusis; HTN hypertension; DM diabetes mellitus; COPD chronic obstructive pulmonary disease

## 2022-03-08 DIAGNOSIS — M542 Cervicalgia: Secondary | ICD-10-CM | POA: Diagnosis not present

## 2022-03-09 ENCOUNTER — Encounter (INDEPENDENT_AMBULATORY_CARE_PROVIDER_SITE_OTHER): Payer: Self-pay | Admitting: Ophthalmology

## 2022-03-09 ENCOUNTER — Ambulatory Visit (INDEPENDENT_AMBULATORY_CARE_PROVIDER_SITE_OTHER): Payer: Medicare Other | Admitting: Ophthalmology

## 2022-03-09 DIAGNOSIS — Z961 Presence of intraocular lens: Secondary | ICD-10-CM | POA: Diagnosis not present

## 2022-03-09 DIAGNOSIS — H43823 Vitreomacular adhesion, bilateral: Secondary | ICD-10-CM

## 2022-03-09 DIAGNOSIS — H401130 Primary open-angle glaucoma, bilateral, stage unspecified: Secondary | ICD-10-CM

## 2022-03-09 DIAGNOSIS — H353132 Nonexudative age-related macular degeneration, bilateral, intermediate dry stage: Secondary | ICD-10-CM

## 2022-03-09 DIAGNOSIS — H04123 Dry eye syndrome of bilateral lacrimal glands: Secondary | ICD-10-CM

## 2022-03-09 DIAGNOSIS — H35033 Hypertensive retinopathy, bilateral: Secondary | ICD-10-CM

## 2022-03-09 DIAGNOSIS — I10 Essential (primary) hypertension: Secondary | ICD-10-CM | POA: Diagnosis not present

## 2022-03-10 ENCOUNTER — Ambulatory Visit: Payer: Medicare Other | Admitting: Physician Assistant

## 2022-03-12 ENCOUNTER — Encounter: Payer: Self-pay | Admitting: Adult Health

## 2022-03-12 ENCOUNTER — Inpatient Hospital Stay: Payer: Medicare Other | Attending: Hematology and Oncology | Admitting: Adult Health

## 2022-03-12 ENCOUNTER — Other Ambulatory Visit: Payer: Self-pay

## 2022-03-12 VITALS — BP 125/79 | HR 105 | Temp 97.9°F | Resp 18 | Ht 62.0 in | Wt 156.5 lb

## 2022-03-12 DIAGNOSIS — K219 Gastro-esophageal reflux disease without esophagitis: Secondary | ICD-10-CM | POA: Diagnosis not present

## 2022-03-12 DIAGNOSIS — Z8349 Family history of other endocrine, nutritional and metabolic diseases: Secondary | ICD-10-CM | POA: Diagnosis not present

## 2022-03-12 DIAGNOSIS — Z90710 Acquired absence of both cervix and uterus: Secondary | ICD-10-CM | POA: Insufficient documentation

## 2022-03-12 DIAGNOSIS — Z83511 Family history of glaucoma: Secondary | ICD-10-CM | POA: Insufficient documentation

## 2022-03-12 DIAGNOSIS — Z9049 Acquired absence of other specified parts of digestive tract: Secondary | ICD-10-CM | POA: Insufficient documentation

## 2022-03-12 DIAGNOSIS — Z1239 Encounter for other screening for malignant neoplasm of breast: Secondary | ICD-10-CM | POA: Diagnosis not present

## 2022-03-12 DIAGNOSIS — D0512 Intraductal carcinoma in situ of left breast: Secondary | ICD-10-CM | POA: Insufficient documentation

## 2022-03-12 DIAGNOSIS — Z87891 Personal history of nicotine dependence: Secondary | ICD-10-CM | POA: Diagnosis not present

## 2022-03-12 DIAGNOSIS — Z801 Family history of malignant neoplasm of trachea, bronchus and lung: Secondary | ICD-10-CM | POA: Diagnosis not present

## 2022-03-12 DIAGNOSIS — Z79899 Other long term (current) drug therapy: Secondary | ICD-10-CM | POA: Insufficient documentation

## 2022-03-12 DIAGNOSIS — Z8249 Family history of ischemic heart disease and other diseases of the circulatory system: Secondary | ICD-10-CM | POA: Diagnosis not present

## 2022-03-12 NOTE — Progress Notes (Signed)
Ruth Wolfe Follow up:    [facility] [place] [address]   DIAGNOSIS: Wolfe Staging  Ductal carcinoma in situ (DCIS) of left breast Staging form: Breast, AJCC 8th Edition - Clinical: Stage 0 (cTis (DCIS), cN0, cM0, G2, ER+, PR+, HER2: Not Assessed) - Signed by Nicholas Lose, MD on 09/30/2021 Stage prefix: Initial diagnosis Histologic grading system: 3 grade system   SUMMARY OF ONCOLOGIC HISTORY: Oncology History  Ductal carcinoma in situ (DCIS) of left breast  09/22/2021 Initial Diagnosis   Screening mammogram detected left breast calcifications UOQ 1 cm, stereotactic biopsy revealed intermediate grade DCIS, ER/PR positive   09/30/2021 Wolfe Staging   Staging form: Breast, AJCC 8th Edition - Clinical: Stage 0 (cTis (DCIS), cN0, cM0, G2, ER+, PR+, HER2: Not Assessed) - Signed by Nicholas Lose, MD on 09/30/2021 Stage prefix: Initial diagnosis Histologic grading system: 3 grade system   10/28/2021 Surgery   Left lumpectomy: Area of fibrosis, fat necrosis and hemorrhage consistent with the biopsy changes, benign, no residual DCIS identified   10/2021 -  Anti-estrogen oral therapy   Tamoxifen 5 years       CURRENT THERAPY:  INTERVAL HISTORY: Ruth Wolfe 77 y.o. female returns for follow-up of her history of not base of breast Wolfe.  Since her last visit she underwent bone density testing which demonstrated osteoporosis with a T-score of -2.8 in the left forearm.   Patient Active Problem List   Diagnosis Date Noted  . Ductal carcinoma in situ (DCIS) of left breast 09/28/2021  . GERD (gastroesophageal reflux disease) 03/03/2021  . Positive ANA (antinuclear antibody) 08/07/2020  . Bilateral dry eyes 08/07/2020  . Pain in right elbow 08/07/2020  . Peripheral neuropathy 08/07/2020  . Bilateral foot pain 08/07/2020  . 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 Body mass index (BMI) 28.0-28.9, adult  07/02/2020  . Elevated blood-pressure reading, without diagnosis of hypertension 12/21/2019  . Rheumatoid arthritis ([place]) 12/21/2019  . Chronic left shoulder pain 10/12/2019  . Pain in joint of right hip 06/08/2019  . Cervical radiculopathy 10/30/2015  . Difficulty speaking 12/06/2011  . Paralysis of vocal cords 12/06/2011    is allergic to misc. sulfonamide containing compounds and sulfa antibiotics.  MEDICAL HISTORY: Past Medical History:  Diagnosis Date  . Allergy 2013  . Arthritis 1980  . Asthma 2000  . Breast Wolfe ([place]) 09/22/2021   DCIS  . Bruit    Per patient  . Cataracts, both eyes 2021   surgery 2022  . Environmental allergies 2013  . Fibromyalgia   . GERD (gastroesophageal reflux disease) 2013  . Glaucoma 2016   POAG OU  . History of hiatal hernia   . Hyperlipidemia 2014  . Hypertensive retinopathy 2021   OU  . Macular degeneration 2014  . Neuropathy 2013   legs and arms  . Osteopenia 2000  . Paralyzed vocal cords 2013   Per patient  . Tongue atrophy 2013   Per patient r/t paralyzed vocal cords  . Vitamin B12 deficiency 2021  . Vitamin D deficiency 2021    SURGICAL HISTORY: Past Surgical History:  Procedure Laterality Date  . APPENDECTOMY    . BREAST LUMPECTOMY WITH RADIOACTIVE SEED LOCALIZATION Left 10/28/2021   Procedure: LEFT BREAST SEED LUMPECTOMY;  Surgeon: Erroll Luna, MD;  Location: [facility];  Service: General;  Laterality: Left;  . CATARACT EXTRACTION    . CERVICAL SPINE SURGERY  01/30/2020   Per patient  .  TOTAL ABDOMINAL HYSTERECTOMY  1981    SOCIAL HISTORY: Social History   Socioeconomic History  . Marital status: Divorced    Spouse name: Not on file  . Number of children: Not on file  . Years of education: Not on file  . Highest education level: Not on file  Occupational History  . Not on file  Tobacco Use  . Smoking status: Former    Types: Cigarettes  . Smokeless tobacco: Never  Vaping Use  . Vaping Use: Never used   Substance and Sexual Activity  . Alcohol use: Yes    Alcohol/week: 5.0 - 6.0 standard drinks of alcohol    Types: 5 - 6 Cans of beer per week  . Drug use: No  . Sexual activity: Not on file  Other Topics Concern  . Not on file  Social History Narrative  . Not on file   Social Determinants of Health   Financial Resource Strain: Low Risk  (07/02/2021)   Overall Financial Resource Strain (CARDIA)   . Difficulty of Paying Living Expenses: Not hard at all  Food Insecurity: No Food Insecurity (07/02/2021)   Hunger Vital Sign   . Worried About Charity fundraiser in the Last Year: Never true   . Ran Out of Food in the Last Year: Never true  Transportation Needs: No Transportation Needs (07/02/2021)   PRAPARE - Transportation   . Lack of Transportation (Medical): No   . Lack of Transportation (Non-Medical): No  Physical Activity: Sufficiently Active (07/02/2021)   Exercise Vital Sign   . Days of Exercise per Week: 3 days   . Minutes of Exercise per Session: 50 min  Stress: No Stress Concern Present (07/02/2021)   [place]   . Feeling of Stress : Not at all  Social Connections: Not on file  Intimate Partner Violence: Not on file    FAMILY HISTORY: Family History  Problem Relation Age of Onset  . Hypertension Mother   . Lung Wolfe Father        he smoked  . Glaucoma Father   . Gout Father   . Hypertension Sister   . Liver disease Sister   . Brain Wolfe Cousin        glioblastoma, paternal first cousin  . Colon Wolfe Neg Hx   . Stomach Wolfe Neg Hx     Review of Systems - Oncology    PHYSICAL EXAMINATION  ECOG PERFORMANCE STATUS: {CHL ONC ECOG YQ:[id number]}  Vitals:   03/12/22 1016  BP: 125/79  Pulse: (!) 105  Resp: 18  Temp: 97.9 F (36.6 C)  SpO2: 100%    Physical Exam  LABORATORY DATA:  CBC    Component Value Date/Time   WBC 7.9 09/30/2021 1209   WBC 6.6 07/07/2021 1012   RBC 4.09  09/30/2021 1209   HGB 13.6 09/30/2021 1209   HCT 40.5 09/30/2021 1209   PLT 250 09/30/2021 1209   MCV 99.0 09/30/2021 1209   MCV 95.6 04/04/2015 1023   MCH 33.3 09/30/2021 1209   MCHC 33.6 09/30/2021 1209   RDW 13.0 09/30/2021 1209   LYMPHSABS 1.9 09/30/2021 1209   MONOABS 0.9 09/30/2021 1209   EOSABS 0.2 09/30/2021 1209   BASOSABS 0.0 09/30/2021 1209    CMP     Component Value Date/Time   NA 138 09/30/2021 1230   K 3.9 09/30/2021 1230   CL 103 09/30/2021 1230   CO2 27 09/30/2021 1230   GLUCOSE  97 09/30/2021 1230   BUN 16 09/30/2021 1230   CREATININE 0.81 09/30/2021 1230   CALCIUM 9.4 09/30/2021 1230   PROT 7.3 09/30/2021 1230   ALBUMIN 4.4 09/30/2021 1230   AST 17 09/30/2021 1230   ALT 15 09/30/2021 1230   ALKPHOS 74 09/30/2021 1230   BILITOT 1.1 09/30/2021 1230   GFRNONAA >60 09/30/2021 1230       PENDING LABS:   RADIOGRAPHIC STUDIES:  No results found.   PATHOLOGY:     ASSESSMENT and THERAPY PLAN:   No problem-specific Assessment & Plan notes found for this encounter.   No orders of the defined types were placed in this encounter.   All questions were answered. The patient knows to call the clinic with any problems, questions or concerns. We can certainly see the patient much sooner if necessary. This note was electronically signed. Scot Dock, NP 03/12/2022

## 2022-03-12 NOTE — Patient Instructions (Signed)
Bone Health Bones protect organs, store calcium, anchor muscles, and support the whole body. Keeping your bones strong is important, especially as you get older. You can take actions to help keep your bones strong and healthy. Why is keeping my bones healthy important?  Keeping your bones healthy is important because your body constantly replaces bone cells. Cells get old, and new cells take their place. As we age, we lose bone cells because the body may not be able to make enough new cells to replace the old cells. The amount of bone cells and bone tissue you have is referred to as bone mass. The higher your bone mass, the stronger your bones. The aging process leads to an overall loss of bone mass in the body, which can increase the likelihood of: Broken bones. A condition in which the bones become weak and brittle (osteoporosis). A large decline in bone mass occurs in older adults. In women, it occurs about the time of menopause. What actions can I take to keep my bones healthy? Good health habits are important for maintaining healthy bones. This includes eating nutritious foods and exercising regularly. To have healthy bones, you need to get enough of the right minerals and vitamins. Most nutrition experts recommend getting these nutrients from the foods that you eat. In some cases, taking supplements may also be recommended. Doing certain types of exercise is also important for bone health. What are the nutritional recommendations for healthy bones?  Eating a well-balanced diet with plenty of calcium and vitamin D will help to protect your bones. Nutritional recommendations vary from person to person. Ask your health care provider what is healthy for you. Here are some general guidelines. Get enough calcium Calcium is the most important (essential) mineral for bone health. Most people can get enough calcium from their diet, but supplements may be recommended for people who are at risk for  osteoporosis. Good sources of calcium include: Dairy products, such as low-fat or nonfat milk, cheese, and yogurt. Dark green leafy vegetables, such as bok choy and broccoli. Foods that have calcium added to them (are fortified). Foods that may be fortified with calcium include orange juice, cereal, bread, soy beverages, and tofu products. Nuts, such as almonds. Follow these recommended amounts for daily calcium intake: Infants, 0-6 months: 200 mg. Infants, 6-12 months: 260 mg. Children, age 1-3: 700 mg. Children, age 4-8: 1,000 mg. Children, age 9-13: 1,300 mg. Teens, age 14-18: 1,300 mg. Adults, age 19-50: 1,000 mg. Adults, age 51-70: Men: 1,000 mg. Women: 1,200 mg. Adults, age 71 or older: 1,200 mg. Pregnant and breastfeeding females: Teens: 1,300 mg. Adults: 1,000 mg. Get enough vitamin D Vitamin D is the most essential vitamin for bone health. It helps the body absorb calcium. Sunlight stimulates the skin to make vitamin D, so be sure to get enough sunlight. If you live in a cold climate or you do not get outside often, your health care provider may recommend that you take vitamin D supplements. Good sources of vitamin D in your diet include: Egg yolks. Saltwater fish. Milk and cereal fortified with vitamin D. Follow these recommended amounts for daily vitamin D intake: Infants, 0-12 months: 400 international units (IU). Children and teens, age 1-18: 600 international units. Adults, age 59 or younger: 600 international units. Adults, age 60 or older: 600-1,000 international units. Get other important nutrients Other nutrients that are important for bone health include: Phosphorus. This mineral is found in meat, poultry, dairy foods, nuts, and legumes. The   recommended daily intake for adult men and adult women is 700 mg. Magnesium. This mineral is found in seeds, nuts, dark green vegetables, and legumes. The recommended daily intake for adult men is 400-420 mg. For adult women,  it is 310-320 mg. Vitamin K. This vitamin is found in green leafy vegetables. The recommended daily intake is 120 mcg for adult men and 90 mcg for adult women. What type of physical activity is best for building and maintaining healthy bones? Weight-bearing and strength-building activities are important for building and maintaining healthy bones. Weight-bearing activities cause muscles and bones to work against gravity. Strength-building activities increase the strength of the muscles that support bones. Weight-bearing and muscle-building activities include: Walking and hiking. Jogging and running. Dancing. Gym exercises. Lifting weights. Tennis and racquetball. Climbing stairs. Aerobics. Adults should get at least 30 minutes of moderate physical activity on most days. Children should get at least 60 minutes of moderate physical activity on most days. Ask your health care provider what type of exercise is best for you. How can I find out if my bone mass is low? Bone mass can be measured with an X-ray test called a bone mineral density (BMD) test. This test is recommended for all women who are age 65 or older. It may also be recommended for: Men who are age 70 or older. People who are at risk for osteoporosis because of: Having a long-term disease that weakens bones, such as kidney disease or rheumatoid arthritis. Having menopause earlier than normal. Taking medicine that weakens bones, such as steroids, thyroid hormones, or hormone treatment for breast cancer or prostate cancer. Smoking. Drinking three or more alcoholic drinks a day. Being underweight. Sedentary lifestyle. If you find that you have a low bone mass, you may be able to prevent osteoporosis or further bone loss by changing your diet and lifestyle. Where can I find more information? Bone Health & Osteoporosis Foundation: www.nof.org/patients National Institutes of Health: www.bones.nih.gov International Osteoporosis  Foundation: www.iofbonehealth.org Summary The aging process leads to an overall loss of bone mass in the body, which can increase the likelihood of broken bones and osteoporosis. Eating a well-balanced diet with plenty of calcium and vitamin D will help to protect your bones. Weight-bearing and strength-building activities are also important for building and maintaining strong bones. Bone mass can be measured with an X-ray test called a bone mineral density (BMD) test. This information is not intended to replace advice given to you by your health care provider. Make sure you discuss any questions you have with your health care provider. Document Revised: 07/30/2020 Document Reviewed: 07/30/2020 Elsevier Patient Education  2023 Elsevier Inc.  

## 2022-03-15 ENCOUNTER — Encounter: Payer: Self-pay | Admitting: Adult Health

## 2022-03-15 NOTE — Assessment & Plan Note (Signed)
Ruth Wolfe is a 77 year old woman with history of stage 0 breast cancer that was diagnosed in July 2023 she is status post left lumpectomy.  She has opted to forego radiation and antiestrogen therapy.  Due to the side effects that she had with tamoxifen and her decreased bone density she is opted to forego adjuvant antiestrogen therapy.   He does have increased risk of breast cancer recurrence considering her noninvasive breast cancer diagnosis.  I recommended that she undergo mammogram every July alternating with breast MRI every January.  She would like to proceed with this.  We also discussed her osteoporosis.  She is hoping to manage the osteoporosis with lifestyle modifications rather than bisphosphonate therapy.  We discussed this in thorough detail and I gave her information in her after visit summary about how to best optimize her calcium, vitamin D, and weightbearing exercises.  I recommended continued healthy diet and exercise.  She will undergo her mammogram in July and we will see her back here in August for her follow-up and surveillance.

## 2022-03-16 DIAGNOSIS — M542 Cervicalgia: Secondary | ICD-10-CM | POA: Diagnosis not present

## 2022-03-19 DIAGNOSIS — M542 Cervicalgia: Secondary | ICD-10-CM | POA: Diagnosis not present

## 2022-03-23 ENCOUNTER — Ambulatory Visit (INDEPENDENT_AMBULATORY_CARE_PROVIDER_SITE_OTHER): Payer: Medicare Other | Admitting: Family

## 2022-03-23 ENCOUNTER — Encounter: Payer: Self-pay | Admitting: Family

## 2022-03-23 VITALS — BP 117/74 | HR 86 | Temp 97.9°F | Ht 62.0 in | Wt 155.8 lb

## 2022-03-23 DIAGNOSIS — R109 Unspecified abdominal pain: Secondary | ICD-10-CM

## 2022-03-23 DIAGNOSIS — K219 Gastro-esophageal reflux disease without esophagitis: Secondary | ICD-10-CM

## 2022-03-23 DIAGNOSIS — R1310 Dysphagia, unspecified: Secondary | ICD-10-CM | POA: Diagnosis not present

## 2022-03-23 DIAGNOSIS — M542 Cervicalgia: Secondary | ICD-10-CM | POA: Diagnosis not present

## 2022-03-23 LAB — CBC WITH DIFFERENTIAL/PLATELET
Basophils Absolute: 0 10*3/uL (ref 0.0–0.1)
Basophils Relative: 0.3 % (ref 0.0–3.0)
Eosinophils Absolute: 0.1 10*3/uL (ref 0.0–0.7)
Eosinophils Relative: 1.8 % (ref 0.0–5.0)
HCT: 40.7 % (ref 36.0–46.0)
Hemoglobin: 13.5 g/dL (ref 12.0–15.0)
Lymphocytes Relative: 20.6 % (ref 12.0–46.0)
Lymphs Abs: 1.6 10*3/uL (ref 0.7–4.0)
MCHC: 33.3 g/dL (ref 30.0–36.0)
MCV: 101.3 fl — ABNORMAL HIGH (ref 78.0–100.0)
Monocytes Absolute: 0.8 10*3/uL (ref 0.1–1.0)
Monocytes Relative: 10.8 % (ref 3.0–12.0)
Neutro Abs: 5.1 10*3/uL (ref 1.4–7.7)
Neutrophils Relative %: 66.5 % (ref 43.0–77.0)
Platelets: 251 10*3/uL (ref 150.0–400.0)
RBC: 4.02 Mil/uL (ref 3.87–5.11)
RDW: 12 % (ref 11.5–15.5)
WBC: 7.6 10*3/uL (ref 4.0–10.5)

## 2022-03-23 LAB — COMPREHENSIVE METABOLIC PANEL
ALT: 17 U/L (ref 0–35)
AST: 19 U/L (ref 0–37)
Albumin: 4.2 g/dL (ref 3.5–5.2)
Alkaline Phosphatase: 50 U/L (ref 39–117)
BUN: 12 mg/dL (ref 6–23)
CO2: 28 mEq/L (ref 19–32)
Calcium: 9.3 mg/dL (ref 8.4–10.5)
Chloride: 104 mEq/L (ref 96–112)
Creatinine, Ser: 0.81 mg/dL (ref 0.40–1.20)
GFR: 69.64 mL/min (ref >60.00)
Glucose, Bld: 83 mg/dL (ref 70–99)
Potassium: 4.6 mEq/L (ref 3.5–5.1)
Sodium: 140 mEq/L (ref 135–145)
Total Bilirubin: 0.8 mg/dL (ref 0.2–1.2)
Total Protein: 6.9 g/dL (ref 6.0–8.3)

## 2022-03-23 MED ORDER — AMOXICILLIN-POT CLAVULANATE 875-125 MG PO TABS: 1.0000 | ORAL_TABLET | Freq: Two times a day (BID) | ORAL | 0 refills | Status: AC

## 2022-03-23 MED ORDER — PANTOPRAZOLE SODIUM 40 MG PO TBEC: 40.0000 mg | DELAYED_RELEASE_TABLET | Freq: Two times a day (BID) | ORAL | 0 refills | Status: DC

## 2022-03-23 NOTE — Progress Notes (Signed)
Ruth Wolfe is a 77 y.o. 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 20 tablet 0   pantoprazole (PROTONIX) 40 MG tablet Take 1 tablet (40 mg total) by mouth 2 (two) times daily before a meal. 30 tablet 0   acetaminophen (TYLENOL) 500 MG tablet Take 1,000 mg by mouth every 6 (six) hours as needed for moderate pain. (Patient not taking: Reported on 03/12/2022)     Cholecalciferol (VITAMIN D) 50 MCG (2000 UT) tablet Take 4,000 Units by mouth daily.     dorzolamide (TRUSOPT) 2 % ophthalmic solution Place 1 drop into both eyes 2 (two) times daily.     fluticasone (FLONASE) 50 MCG/ACT nasal spray SPRAY 2 SPRAYS INTO EACH NOSTRIL EVERY DAY 48 mL 2   fluticasone-salmeterol (WIXELA INHUB) 250-50 MCG/ACT AEPB Inhale 1 puff into the lungs in the morning and at bedtime. (Patient taking differently: Inhale 1 puff into the lungs at bedtime.) 3 each 3   Polyethyl Glycol-Propyl Glycol (SYSTANE OP) Place 1 drop into both eyes 2 (two) times daily. (Patient not taking: Reported on  03/12/2022)     vitamin B-12 (CYANOCOBALAMIN) 1000 MCG tablet Take 1,000 mcg by mouth daily.     No current facility-administered medications for this visit.    Allergies: Misc. sulfonamide containing compounds and Sulfa antibiotics  Past Medical History:  Diagnosis Date   Allergy 2013   Arthritis 1980   Asthma 2000   Breast cancer ([place]) 09/22/2021   DCIS   Bruit    Per patient   Cataracts, both eyes 2021   surgery 2022   Environmental allergies 2013   Fibromyalgia    GERD (gastroesophageal reflux disease) 2013   Glaucoma 2016   POAG OU   History of hiatal hernia    Hyperlipidemia 2014   Hypertensive retinopathy 2021   OU   Macular degeneration 2014   Neuropathy 2013   legs and arms   Osteopenia 2000   Paralyzed vocal cords 2013   Per patient   Tongue atrophy 2013   Per patient r/t paralyzed vocal cords   Vitamin B12 deficiency 2021   Vitamin D deficiency 2021    Past Surgical History:  Procedure Laterality Date   APPENDECTOMY     BREAST LUMPECTOMY WITH RADIOACTIVE SEED LOCALIZATION Left 10/28/2021   Procedure: LEFT BREAST SEED LUMPECTOMY;  Surgeon: Erroll Luna, MD;  Location: [facility];  Service: General;  Laterality: Left;   CATARACT EXTRACTION     CERVICAL SPINE SURGERY  01/30/2020   Per patient   TOTAL ABDOMINAL HYSTERECTOMY  1981    Family History  Problem  Relation Age of Onset   Hypertension Mother    Lung cancer Father        he smoked   Glaucoma Father    Gout Father    Hypertension Sister    Liver disease Sister    Brain cancer Cousin        glioblastoma, paternal first cousin   Colon cancer Neg Hx    Stomach cancer Neg Hx     Social History   Tobacco Use   Smoking status: Former    Types: Cigarettes   Smokeless tobacco: Never  Substance Use Topics   Alcohol use: Yes    Alcohol/week: 5.0 - 6.0 standard drinks of alcohol    Types: 5 - 6 Cans of beer per week    Subjective:   Presents with concerns for reflux type symptoms; symptoms x 1  week; "feels like my bowels aren't right." Started after eating some almonds last week- had to lie down and then persisting aching sensation in upper abdomen; known history of diverticulosis on colonoscopy in 2018;   Objective:  Vitals:   03/23/22 1029  BP: 117/74  Pulse: 86  Temp: 97.9 F (36.6 C)  SpO2: 97%  Weight: 155 lb 12.8 oz (70.7 kg)  Height: '5\' 2"'$  (1.575 m)    General: Well developed, well nourished, in no acute distress  Skin : Warm and dry.  Head: Normocephalic and atraumatic  Lungs: Respirations unlabored; clear to auscultation bilaterally without wheeze, rales, rhonchi  CVS exam: normal rate and regular rhythm.  Abdomen: Soft; nontender; nondistended; normoactive bowel sounds; no masses or hepatosplenomegaly  Neurologic: Alert and oriented; speech intact; face symmetrical; moves all extremities well; CNII-XII intact without focal deficit   Assessment:  1. Abdominal pain, unspecified abdominal location   2. Gastroesophageal reflux disease, unspecified whether esophagitis present   3. Dysphagia, unspecified type     Plan:  Concern for diverticulitis; check CBC, CMP today; will update abd/ pelvic CT; Rx for Augmentin 875 mg bid x 10 days; follow up to be determined; Will also start Protonix 40 mg bid; refer to GI to discuss esophageal dilatation;   No follow-ups on file.  Orders Placed This Encounter  Procedures   CT Abdomen Pelvis W Contrast    Standing Status:   Future    Standing Expiration Date:   03/24/2023    Order Specific Question:   If indicated for the ordered procedure, I authorize the administration of contrast media per Radiology protocol    Answer:   Yes    Order Specific Question:   Does the patient have a contrast media/X-ray dye allergy?    Answer:   No    Order Specific Question:   Preferred imaging location?    Answer:   [id number]    Order Specific Question:   Is Oral Contrast requested for this exam?    Answer:   Yes, Per Radiology  protocol   CBC with Differential/Platelet   Comp Met (CMET)   Ambulatory referral to Gastroenterology    Referral Priority:   Routine    Referral Type:   Consultation    Referral Reason:   Specialty Services Required    Referred to Provider:   Ladene Artist, MD    Number of Visits Requested:   1    Requested Prescriptions   Signed Prescriptions Disp Refills   amoxicillin-clavulanate (AUGMENTIN) 875-125 MG tablet 20 tablet 0    Sig: Take 1 tablet by mouth 2 (two) times daily  for 10 days.   pantoprazole (PROTONIX) 40 MG tablet 30 tablet 0    Sig: Take 1 tablet (40 mg total) by mouth 2 (two) times daily before a meal.

## 2022-03-24 DIAGNOSIS — H401111 Primary open-angle glaucoma, right eye, mild stage: Secondary | ICD-10-CM | POA: Diagnosis not present

## 2022-03-24 DIAGNOSIS — H401122 Primary open-angle glaucoma, left eye, moderate stage: Secondary | ICD-10-CM | POA: Diagnosis not present

## 2022-03-24 DIAGNOSIS — H04123 Dry eye syndrome of bilateral lacrimal glands: Secondary | ICD-10-CM | POA: Diagnosis not present

## 2022-03-25 DIAGNOSIS — M542 Cervicalgia: Secondary | ICD-10-CM | POA: Diagnosis not present

## 2022-03-29 ENCOUNTER — Encounter: Payer: Self-pay | Admitting: Physician Assistant

## 2022-03-30 ENCOUNTER — Other Ambulatory Visit: Payer: Self-pay | Admitting: Family

## 2022-03-30 DIAGNOSIS — M542 Cervicalgia: Secondary | ICD-10-CM | POA: Diagnosis not present

## 2022-04-01 DIAGNOSIS — M542 Cervicalgia: Secondary | ICD-10-CM | POA: Diagnosis not present

## 2022-04-02 ENCOUNTER — Ambulatory Visit
Admission: RE | Admit: 2022-04-02 | Discharge: 2022-04-02 | Disposition: A | Discharge status: Home / Self Care | Payer: Medicare Other | Source: Ambulatory Visit | Attending: Family | Admitting: Family

## 2022-04-02 DIAGNOSIS — R109 Unspecified abdominal pain: Secondary | ICD-10-CM

## 2022-04-02 DIAGNOSIS — K5730 Diverticulosis of large intestine without perforation or abscess without bleeding: Secondary | ICD-10-CM | POA: Diagnosis not present

## 2022-04-02 DIAGNOSIS — I700 Atherosclerosis of aorta: Secondary | ICD-10-CM | POA: Diagnosis not present

## 2022-04-02 DIAGNOSIS — Z90710 Acquired absence of both cervix and uterus: Secondary | ICD-10-CM | POA: Diagnosis not present

## 2022-04-02 MED ORDER — IOPAMIDOL (ISOVUE-300) INJECTION 61%
100.0000 mL | Freq: Once | INTRAVENOUS | Status: AC | PRN
  Administered 2022-04-02: 100 mL via INTRAVENOUS

## 2022-04-06 DIAGNOSIS — M542 Cervicalgia: Secondary | ICD-10-CM | POA: Diagnosis not present

## 2022-04-08 DIAGNOSIS — M542 Cervicalgia: Secondary | ICD-10-CM | POA: Diagnosis not present

## 2022-04-19 NOTE — Progress Notes (Unsigned)
04/20/2022 [name] [id number] [birth year]  Referring provider: Marrian Salvage,* Primary GI doctor: Dr. Fuller Plan  ASSESSMENT AND PLAN:  Esophageal dysphagia, history of hiatal hernia with reflux, now with epigastric discomfort and abdominal fullness Previous barium swallow 2013 showed hiatal hernia with slight disruption of tablet secondary to hiatal hernia and moderate to severe reflux. Patient's had dysphagia since that time, does not want to take any medications. Most likely the story is consistent with hiatal hernia with or without obstruction however with severity of her symptoms, how long she has had the symptoms, I advised endoscopy to evaluate for Barrett's esophagus, hiatal hernia, Cameron lesions, peptic ulcer disease, gastritis, esophagitis but patient declined. Will start with barium swallow initially and potential EGD afterwards. Patient has high level of anxiety and prefers not to be put to sleep or have endoscopy time if not needed. Patient will avoid lactose Will check for H. pylori with stool, patient's not on PPI and wishes not to get on 1. Given Pepcid trial  Abnormal stools alternating hard to soft mushy stools No hematochezia, no alarm symptoms, most recent colonoscopy 2018 unremarkable other than diverticulosis and tortuous colon No recent addition of medications Likely has to do with tortuous colon/pelvic floor get squatty potty/stool Do trial off of lactose FODMAP given Can add on low dose fiber   Patient Care Team: Marrian Salvage, FNP as PCP - General (Internal Medicine) Erroll Luna, MD as Consulting Physician (General Surgery) Nicholas Lose, MD as Consulting Physician (Hematology and Oncology) Gery Pray, MD as Consulting Physician (Radiation Oncology)  HISTORY OF PRESENT ILLNESS: 77 y.o. female with a past medical history of hyperlipidemia, hypertensive retinopathy, breast cancer, B12 deficiency, vitamin D deficiency,  history of hiatal hernia with GERD and others listed below presents for evaluation of GERD.   11/17/2016 colonoscopy Dr. Fuller Plan for personal history of adenomatous polyps, good prep difficult due to tortuous colon moderate diverticulosis with narrowing in the colon associated diverticular opening peridiverticular erythema internal hemorrhoids no repeat: Due to age. 04/02/2022 CT abdomen pelvis with contrast for abdominal pain for 3 weeks showed no acute intra-abdominal or intrapelvic process.  She states she prides herself on staying healthy but lately has not been feeling well.  Very anxious about talking with me and about medications. States she has very bad reactions to medications.  03/15/22 Monday after eating almonds, had severe epigastric AB pain that lasted for a week,  Now feels that her food stops at top of her stomach, and will get full quickly.  She wants to eat but gets full very quickly, can feel a pill, steak, chicken and will get stuck, can not even swallow her saliva and will have to vomit a bolus of food. No nausea associated with this.   She states this has been going on since 2013, she had a barium swallow that showed small hiatal hernia mild/mod GERD, barium pill lodges at hiatal hernia but does pass, mild tertiary contractions.  She never started on nexium because she did not want to take calcium with it.  She denies GERD/heart burn.  She is having joint pain all over, she is on aleve AS needed, very rare.  She drinks ETOH daily, one beer at lunch and 2 glasses of wine with dinner.  No tobacco use, no drug use.   She states she has 3 kinds of Bm's.  Can have normal BM after breakfast, small push, comes out without being messy, well formed.  She will then have  a week or so of constipation, dry small hard piece. She can have soft mushy stools, feels incomplete Bm's, messy, this happens more often other than the other 2 forms.  Just once a day, regular after breakfast, once a day,  never diarrhea.  She states Oct 2022 had almost a month without BM, started around that time, taking dulcolax and had "wash out"  No blood in the stool, no melena, no lower AB pain.  No urgency, no fecal incontinence.   She states she does not tolerate medications well.  She was new eye drops and having funny feelings, was on antiestrogen and had gas/AB pain.   She  reports that she has quit smoking. Her smoking use included cigarettes. She has never used smokeless tobacco. She reports current alcohol use of about 5.0 - 6.0 standard drinks of alcohol per week. She reports that she does not use drugs.  RELEVANT LABS AND IMAGING: CBC    Component Value Date/Time   WBC 7.6 03/23/2022 1107   RBC 4.02 03/23/2022 1107   HGB 13.5 03/23/2022 1107   HGB 13.6 09/30/2021 1209   HCT 40.7 03/23/2022 1107   PLT 251.0 03/23/2022 1107   PLT 250 09/30/2021 1209   MCV 101.3 (H) 03/23/2022 1107   MCV 95.6 04/04/2015 1023   MCH 33.3 09/30/2021 1209   MCHC 33.3 03/23/2022 1107   RDW 12.0 03/23/2022 1107   LYMPHSABS 1.6 03/23/2022 1107   MONOABS 0.8 03/23/2022 1107   EOSABS 0.1 03/23/2022 1107   BASOSABS 0.0 03/23/2022 1107   Recent Labs    07/07/21 1012 09/30/21 1209 03/23/22 1107  HGB 13.0 13.6 13.5     CMP     Component Value Date/Time   NA 140 03/23/2022 1107   K 4.6 03/23/2022 1107   CL 104 03/23/2022 1107   CO2 28 03/23/2022 1107   GLUCOSE 83 03/23/2022 1107   BUN 12 03/23/2022 1107   CREATININE 0.81 03/23/2022 1107   CREATININE 0.81 09/30/2021 1230   CALCIUM 9.3 03/23/2022 1107   PROT 6.9 03/23/2022 1107   ALBUMIN 4.2 03/23/2022 1107   AST 19 03/23/2022 1107   AST 17 09/30/2021 1230   ALT 17 03/23/2022 1107   ALT 15 09/30/2021 1230   ALKPHOS 50 03/23/2022 1107   BILITOT 0.8 03/23/2022 1107   BILITOT 1.1 09/30/2021 1230   GFRNONAA >60 09/30/2021 1230      Latest Ref Rng & Units 03/23/2022   11:07 AM 09/30/2021   12:30 PM 07/07/2021   10:12 AM  Hepatic Function  Total  Protein 6.0 - 8.3 g/dL 6.9  7.3  6.8   Albumin 3.5 - 5.2 g/dL 4.2  4.4  4.1   AST 0 - 37 U/L 19  17  18   $ ALT 0 - 35 U/L 17  15  16   $ Alk Phosphatase 39 - 117 U/L 50  74  72   Total Bilirubin 0.2 - 1.2 mg/dL 0.8  1.1  1.1       Current Medications:     Current Outpatient Medications (Respiratory):    fluticasone (FLONASE) 50 MCG/ACT nasal spray, SPRAY 2 SPRAYS INTO EACH NOSTRIL EVERY DAY   fluticasone-salmeterol (WIXELA INHUB) 250-50 MCG/ACT AEPB, Inhale 1 puff into the lungs in the morning and at bedtime. (Patient taking differently: Inhale 1 puff into the lungs at bedtime.)  Current Outpatient Medications (Analgesics):    naproxen sodium (ALEVE) 220 MG tablet, Take 220 mg by mouth as needed.  Current Outpatient Medications (  Hematological):    vitamin B-12 (CYANOCOBALAMIN) 1000 MCG tablet, Take 1,000 mcg by mouth daily.  Current Outpatient Medications (Other):    brimonidine (ALPHAGAN) 0.2 % ophthalmic solution, Place 1 drop into both eyes 2 (two) times daily.   Cholecalciferol (VITAMIN D) 50 MCG (2000 UT) tablet, Take 4,000 Units by mouth daily.   famotidine (PEPCID) 40 MG tablet, Take 1 tablet (40 mg total) by mouth at bedtime.   Melatonin 10 MG TABS, Take 1 tablet by mouth at bedtime.   MIEBO 1.338 GM/ML SOLN, Apply 1 drop to eye 4 (four) times daily.  Medical History:  Past Medical History:  Diagnosis Date   Allergy 2013   Arthritis 1980   Asthma 2000   Breast cancer ([place]) 09/22/2021   DCIS   Bruit    Per patient   Cataracts, both eyes 2021   surgery 2022   Colon polyps    Environmental allergies 2013   Fibromyalgia    GERD (gastroesophageal reflux disease) 2013   Glaucoma 2016   POAG OU   History of hiatal hernia    Hyperlipidemia 2014   Hypertensive retinopathy 2021   OU   Macular degeneration 2014   Neuropathy 2013   legs and arms   Osteopenia 2000   Osteoporosis    Paralyzed vocal cords 2013   Per patient   Tongue atrophy 2013   Per patient r/t  paralyzed vocal cords   Vitamin B12 deficiency 2021   Vitamin D deficiency 2021   Allergies:  Allergies  Allergen Reactions   Misc. Sulfonamide Containing Compounds Other (See Comments)   Sulfa Antibiotics     Wheezing, swelling face and ears     Surgical History:  She  has a past surgical history that includes Total abdominal hysterectomy (1981); Appendectomy (1981); Cataract extraction (Bilateral); Cervical spine surgery (01/30/2020); and Breast lumpectomy with radioactive seed localization (Left, 10/28/2021). Family History:  Her family history includes Alzheimer's disease in her father; Brain cancer in her cousin; Glaucoma in her father; Gout in her father; Hypertension in her mother and sister; Liver disease in her sister; Lung cancer in her father.  REVIEW OF SYSTEMS  : All other systems reviewed and negative except where noted in the History of Present Illness.  PHYSICAL EXAM: BP 136/70 (BP Location: Left Arm, Patient Position: Sitting, Cuff Size: Normal)   Pulse 96   Ht 5' 1.25" (1.556 m)   Wt 155 lb 8 oz (70.5 kg)   BMI 29.14 kg/m  General Appearance: Well nourished, in no apparent distress. Head:   Normocephalic and atraumatic. Eyes:  sclerae anicteric,conjunctive pink  Respiratory: Respiratory effort normal, BS equal bilaterally without rales, rhonchi, wheezing. Cardio: RRR with no MRGs. Peripheral pulses intact.  Abdomen: Soft,  Obese ,active bowel sounds. No tenderness. No masses. Rectal: declines Musculoskeletal: Full ROM, Normal gait. Without edema. Skin:  Dry and intact without significant lesions or rashes Neuro: Alert and  oriented x4;  No focal deficits. Psych:  Cooperative. Normal mood and affect.    Vladimir Crofts, PA-C 12:24 PM

## 2022-04-20 ENCOUNTER — Other Ambulatory Visit (INDEPENDENT_AMBULATORY_CARE_PROVIDER_SITE_OTHER): Payer: Medicare Other

## 2022-04-20 ENCOUNTER — Encounter: Payer: Self-pay | Admitting: Physician Assistant

## 2022-04-20 ENCOUNTER — Other Ambulatory Visit: Payer: Self-pay | Admitting: Family

## 2022-04-20 ENCOUNTER — Ambulatory Visit (INDEPENDENT_AMBULATORY_CARE_PROVIDER_SITE_OTHER): Payer: Medicare Other | Admitting: Physician Assistant

## 2022-04-20 VITALS — BP 136/70 | HR 96 | Ht 61.25 in | Wt 155.5 lb

## 2022-04-20 DIAGNOSIS — K2100 Gastro-esophageal reflux disease with esophagitis, without bleeding: Secondary | ICD-10-CM

## 2022-04-20 DIAGNOSIS — K449 Diaphragmatic hernia without obstruction or gangrene: Secondary | ICD-10-CM | POA: Diagnosis not present

## 2022-04-20 DIAGNOSIS — R1319 Other dysphagia: Secondary | ICD-10-CM

## 2022-04-20 DIAGNOSIS — R195 Other fecal abnormalities: Secondary | ICD-10-CM

## 2022-04-20 LAB — COMPREHENSIVE METABOLIC PANEL
ALT: 21 U/L (ref 0–35)
AST: 20 U/L (ref 0–37)
Albumin: 4.2 g/dL (ref 3.5–5.2)
Alkaline Phosphatase: 61 U/L (ref 39–117)
BUN: 16 mg/dL (ref 6–23)
CO2: 27 mEq/L (ref 19–32)
Calcium: 9.7 mg/dL (ref 8.4–10.5)
Chloride: 101 mEq/L (ref 96–112)
Creatinine, Ser: 0.86 mg/dL (ref 0.40–1.20)
GFR: 64.78 mL/min (ref >60.00)
Glucose, Bld: 88 mg/dL (ref 70–99)
Potassium: 4 mEq/L (ref 3.5–5.1)
Sodium: 137 mEq/L (ref 135–145)
Total Bilirubin: 0.9 mg/dL (ref 0.2–1.2)
Total Protein: 7.3 g/dL (ref 6.0–8.3)

## 2022-04-20 LAB — CBC WITH DIFFERENTIAL/PLATELET
Basophils Absolute: 0 10*3/uL (ref 0.0–0.1)
Basophils Relative: 0.2 % (ref 0.0–3.0)
Eosinophils Absolute: 0.6 10*3/uL (ref 0.0–0.7)
Eosinophils Relative: 6.1 % — ABNORMAL HIGH (ref 0.0–5.0)
HCT: 42.6 % (ref 36.0–46.0)
Hemoglobin: 13.9 g/dL (ref 12.0–15.0)
Lymphocytes Relative: 20.6 % (ref 12.0–46.0)
Lymphs Abs: 1.9 10*3/uL (ref 0.7–4.0)
MCHC: 32.7 g/dL (ref 30.0–36.0)
MCV: 100.1 fl — ABNORMAL HIGH (ref 78.0–100.0)
Monocytes Absolute: 1 10*3/uL (ref 0.1–1.0)
Monocytes Relative: 10.1 % (ref 3.0–12.0)
Neutro Abs: 5.9 10*3/uL (ref 1.4–7.7)
Neutrophils Relative %: 63 % (ref 43.0–77.0)
Platelets: 260 10*3/uL (ref 150.0–400.0)
RBC: 4.26 Mil/uL (ref 3.87–5.11)
RDW: 12.9 % (ref 11.5–15.5)
WBC: 9.4 10*3/uL (ref 4.0–10.5)

## 2022-04-20 MED ORDER — FAMOTIDINE 40 MG PO TABS: 40.0000 mg | ORAL_TABLET | Freq: Every day | ORAL | 3 refills | Status: DC

## 2022-04-20 NOTE — Patient Instructions (Addendum)
You have been scheduled for a Barium Esophogram at [facility] (1st floor of the hospital) on 05/06/22 at 9:00 am. Please arrive 30 minutes prior to your appointment for registration. Make certain not to have anything to eat or drink after midnight prior to your test. If you need to reschedule for any reason, please contact radiology at [phone] to do so. __________________________________________________________________ A barium swallow is an examination that concentrates on views of the esophagus. This tends to be a double contrast exam (barium and two liquids which, when combined, create a gas to distend the wall of the oesophagus) or single contrast (non-ionic iodine based). The study is usually tailored to your symptoms so a good history is essential. Attention is paid during the study to the form, structure and configuration of the esophagus, looking for functional disorders (such as aspiration, dysphagia, achalasia, motility and reflux) EXAMINATION You may be asked to change into a gown, depending on the type of swallow being performed. A radiologist and radiographer will perform the procedure. The radiologist will advise you of the type of contrast selected for your procedure and direct you during the exam. You will be asked to stand, sit or lie in several different positions and to hold a small amount of fluid in your mouth before being asked to swallow while the imaging is performed .In some instances you may be asked to swallow barium coated marshmallows to assess the motility of a solid food bolus. The exam can be recorded as a digital or video fluoroscopy procedure. POST PROCEDURE It will take 1-2 days for the barium to pass through your system. To facilitate this, it is important, unless otherwise directed, to increase your fluids for the next 24-48hrs and to resume your normal diet.  This test typically takes about 30 minutes to  perform. __________________________________________________________________________________   Your provider has requested that you go to the basement level for lab work before leaving today. Press "B" on the elevator. The lab is located at the first door on the left as you exit the elevator.   We have sent the following medications to your pharmacy for you to pick up at your convenience:Pepcid   Stop lactose for 2-4 weeks and cut back on alcohol and see if this helps If not then add on the fiber supplements Add on the squatty potty Add on the pepcid  FIBER SUPPLEMENT You can do metamucil or fibercon once or twice a day but if this causes gas/bloating please switch to Benefiber or Citracel.  Fiber is good for constipation/diarrhea/irritable bowel syndrome.  It can also help with weight loss and can help lower your bad cholesterol (LDL).  Please do 1 TBSP in the morning in water, coffee, or tea.  It can take up to a month before you can see a difference with your bowel movements.  It is cheapest from [employer], [employer], [employer].   This will add some bulk to your stools and prevent the messy part you don't like.   Toileting tips to help with your constipation - Drink at least 64-80 ounces of water/liquid per day. - Establish a time to try to move your bowels every day.  For many people, this is after a cup of coffee or after a meal such as breakfast. - Sit all of the way back on the toilet keeping your back fairly straight and while sitting up, try to rest the tops of your forearms on your upper thighs.   - Raising your feet with a  step stool/squatty potty can be helpful to improve the angle that allows your stool to pass through the rectum. - Relax the rectum feeling it bulge toward the toilet water.  If you feel your rectum raising toward your body, you are contracting rather than relaxing. - Breathe in and slowly exhale. "Belly breath" by expanding your belly towards your belly button. Keep  belly expanded as you gently direct pressure down and back to the anus.  A low pitched GRRR sound can assist with increasing intra-abdominal pressure.  - Repeat 3-4 times. If unsuccessful, contract the pelvic floor to restore normal tone and get off the toilet.  Avoid excessive straining. - To reduce excessive wiping by teaching your anus to normally contract, place hands on outer aspect of knees and resist knee movement outward.  Hold 5-10 second then place hands just inside of knees and resist inward movement of knees.  Hold 5 seconds.  Repeat a few times each way.  Please take your proton pump inhibitor medication, add on pepcid 40 mg at night  Please take this medication 30 minutes to 1 hour before meals- this makes it more effective.  Avoid spicy and acidic foods Avoid fatty foods Limit your intake of coffee, tea, alcohol, and carbonated drinks Work to maintain a healthy weight Keep the head of the bed elevated at least 3 inches with blocks or a wedge pillow if you are having any nighttime symptoms Stay upright for 2 hours after eating Avoid meals and snacks three to four hours before bedtime  Hiatal Hernia  A hiatal hernia occurs when part of the stomach slides above the muscle that separates the abdomen from the chest (diaphragm). A person can be born with a hiatal hernia (congenital), or it may develop over time. In almost all cases of hiatal hernia, only the top part of the stomach pushes through the diaphragm. Many people have a hiatal hernia with no symptoms. The larger the hernia, the more likely it is that you will have symptoms. In some cases, a hiatal hernia allows stomach acid to flow back into the tube that carries food from your mouth to your stomach (esophagus). This may cause heartburn symptoms. The development of heartburn symptoms may mean that you have a condition called gastroesophageal reflux disease (GERD). What are the causes? This condition is caused by a weakness in  the opening (hiatus) where the esophagus passes through the diaphragm to attach to the upper part of the stomach. A person may be born with a weakness in the hiatus, or a weakness can develop over time. What increases the risk? This condition is more likely to develop in: Older people. Age is a major risk factor for a hiatal hernia, especially if you are over the age of 45. Pregnant women. People who are overweight. People who have frequent constipation. What are the signs or symptoms? Symptoms of this condition usually develop in the form of GERD symptoms. Symptoms include: Heartburn. Upset stomach (indigestion). Trouble swallowing. Coughing or wheezing. Wheezing is making high-pitched whistling sounds when you breathe. Sore throat. Chest pain. Nausea and vomiting. How is this diagnosed? This condition may be diagnosed during testing for GERD. Tests that may be done include: X-rays of your stomach or chest. An upper gastrointestinal (GI) series. This is an X-ray exam of your GI tract that is taken after you swallow a chalky liquid that shows up clearly on the X-ray. Endoscopy. This is a procedure to look into your stomach using a thin, flexible  tube that has a tiny camera and light on the end of it. How is this treated? This condition may be treated by: Dietary and lifestyle changes to help reduce GERD symptoms. Medicines. These may include: Over-the-counter antacids. Medicines that make your stomach empty more quickly. Medicines that block the production of stomach acid (H2 blockers). Stronger medicines to reduce stomach acid (proton pump inhibitors). Surgery to repair the hernia, if other treatments are not helping. If you have no symptoms, you may not need treatment. Follow these instructions at home: Lifestyle and activity Do not use any products that contain nicotine or tobacco. These products include cigarettes, chewing tobacco, and vaping devices, such as e-cigarettes. If you  need help quitting, ask your health care provider. Try to achieve and maintain a healthy body weight. Avoid putting pressure on your abdomen. Anything that puts pressure on your abdomen increases the amount of acid that may be pushed up into your esophagus. Avoid bending over, especially after eating. Raise the head of your bed by putting blocks under the legs. This keeps your head and esophagus higher than your stomach. Do not wear tight clothing around your chest or stomach. Try not to strain when having a bowel movement, when urinating, or when lifting heavy objects. Eating and drinking Avoid foods that can worsen GERD symptoms. These may include: Fatty foods, like fried foods. Citrus fruits, like oranges or lemon. Other foods and drinks that contain acid, like orange juice or tomatoes. Spicy food. Chocolate. Eat frequent small meals instead of three large meals a day. This helps prevent your stomach from getting too full. Eat slowly. Do not lie down right after eating. Do not eat 1-2 hours before bed. Do not drink beverages with caffeine. These include cola, coffee, cocoa, and tea. Do not drink alcohol. General instructions Take over-the-counter and prescription medicines only as told by your health care provider. Keep all follow-up visits. Your health care provider will want to check that any new prescribed medicines are helping your symptoms. Contact a health care provider if: Your symptoms are not controlled with medicines or lifestyle changes. You are having trouble swallowing. You have coughing or wheezing that will not go away. Your pain is getting worse. Your pain spreads to your arms, neck, jaw, teeth, or back. You feel nauseous or you vomit. Get help right away if: You have shortness of breath. You vomit blood. You have bright red blood in your stools. You have black, tarry stools. These symptoms may be an emergency. Get help right away. Call 911. Do not wait to see  if the symptoms will go away. Do not drive yourself to the hospital. Summary A hiatal hernia occurs when part of the stomach slides above the muscle that separates the abdomen from the chest. A person may be born with a weakness in the hiatus, or a weakness can develop over time. Symptoms of a hiatal hernia may include heartburn, trouble swallowing, or sore throat. Management of a hiatal hernia includes eating frequent small meals instead of three large meals a day. Get help right away if you vomit blood, have bright red blood in your stools, or have black, tarry stools. This information is not intended to replace advice given to you by your health care provider. Make sure you discuss any questions you have with your health care provider. Document Revised: 04/14/2021 Document Reviewed: 04/14/2021 Elsevier Patient Education  [place].  Due to recent changes in healthcare laws, you may see the results of your  imaging and laboratory studies on MyChart before your provider has had a chance to review them.  We understand that in some cases there may be results that are confusing or concerning to you. Not all laboratory results come back in the same time frame and the provider may be waiting for multiple results in order to interpret others.  Please give [place] 48 hours in order for your provider to thoroughly review all the results before contacting the office for clarification of your results.   Thank you for entrusting me with your care and choosing [facility].  Vicie Mutters PA-C

## 2022-04-21 ENCOUNTER — Other Ambulatory Visit: Payer: Medicare Other

## 2022-04-21 DIAGNOSIS — R1319 Other dysphagia: Secondary | ICD-10-CM

## 2022-04-21 DIAGNOSIS — K2100 Gastro-esophageal reflux disease with esophagitis, without bleeding: Secondary | ICD-10-CM | POA: Diagnosis not present

## 2022-04-21 LAB — IGA: Immunoglobulin A: 191 mg/dL (ref 70–320)

## 2022-04-21 LAB — TISSUE TRANSGLUTAMINASE, IGA: (tTG) Ab, IgA: <1 U/mL

## 2022-04-22 DIAGNOSIS — H401122 Primary open-angle glaucoma, left eye, moderate stage: Secondary | ICD-10-CM | POA: Diagnosis not present

## 2022-04-22 DIAGNOSIS — H401111 Primary open-angle glaucoma, right eye, mild stage: Secondary | ICD-10-CM | POA: Diagnosis not present

## 2022-04-22 DIAGNOSIS — H04123 Dry eye syndrome of bilateral lacrimal glands: Secondary | ICD-10-CM | POA: Diagnosis not present

## 2022-04-22 LAB — HELICOBACTER PYLORI  SPECIAL ANTIGEN
MICRO NUMBER:: 14595351
SPECIMEN QUALITY: ADEQUATE

## 2022-04-27 DIAGNOSIS — Z6828 Body mass index (BMI) 28.0-28.9, adult: Secondary | ICD-10-CM | POA: Diagnosis not present

## 2022-04-27 DIAGNOSIS — H6123 Impacted cerumen, bilateral: Secondary | ICD-10-CM | POA: Diagnosis not present

## 2022-05-06 ENCOUNTER — Ambulatory Visit (HOSPITAL_COMMUNITY)
Admission: RE | Admit: 2022-05-06 | Discharge: 2022-05-06 | Disposition: A | Discharge status: Home / Self Care | Payer: Medicare Other | Source: Ambulatory Visit | Attending: Physician Assistant | Admitting: Physician Assistant

## 2022-05-06 DIAGNOSIS — K2100 Gastro-esophageal reflux disease with esophagitis, without bleeding: Secondary | ICD-10-CM | POA: Insufficient documentation

## 2022-05-06 DIAGNOSIS — K224 Dyskinesia of esophagus: Secondary | ICD-10-CM | POA: Diagnosis not present

## 2022-05-06 DIAGNOSIS — R1319 Other dysphagia: Secondary | ICD-10-CM | POA: Insufficient documentation

## 2022-05-11 ENCOUNTER — Telehealth: Payer: Self-pay | Admitting: Physician Assistant

## 2022-05-11 NOTE — Telephone Encounter (Signed)
See results note. 

## 2022-05-11 NOTE — Telephone Encounter (Signed)
Inbound call from patient returning Linda's call in regards to [facility] esophagus results.

## 2022-05-17 DIAGNOSIS — M65342 Trigger finger, left ring finger: Secondary | ICD-10-CM | POA: Diagnosis not present

## 2022-06-22 ENCOUNTER — Telehealth: Payer: Self-pay | Admitting: Family

## 2022-06-22 NOTE — Telephone Encounter (Signed)
Copied from CRM [id number]. Topic: Medicare AWV >> Jun 22, 2022 10:19 AM Payton Doughty wrote: Reason for CRM: Called patient to schedule Medicare Annual Wellness Visit (AWV). Left message for patient to call back and schedule Medicare Annual Wellness Visit (AWV).  Last date of AWV: 07/02/21  Please schedule an appointment at any time with Donne Anon, CMA  .  If any questions, please contact me.  Thank you ,  Verlee Rossetti; Care Guide Ambulatory Clinical Support [place] [facility] Direct Dial: [phone]

## 2022-06-25 ENCOUNTER — Telehealth: Payer: Self-pay | Admitting: Physician Assistant

## 2022-06-25 NOTE — Telephone Encounter (Signed)
Spoke with pt and reviewed letter with her, she is aware of results. States she is doing well, no SE from meds. She has rescheduled her appt for July.

## 2022-06-25 NOTE — Telephone Encounter (Signed)
Patient called requesting a call back to discuss the last letter that she received so she can understand what the letter is pertaining to. She was informed of her appointment May 2nd, but she will not be in town and will be in office July 3rd for her appointment. Please advise. Thank you.

## 2022-06-28 ENCOUNTER — Telehealth: Payer: Self-pay | Admitting: Family

## 2022-06-28 NOTE — Telephone Encounter (Signed)
Contacted [name] to schedule their annual wellness visit. Appointment made for 08/04/2022.   Verlee Rossetti; Care Guide Ambulatory Clinical Support [place] [facility] Direct Dial: [phone]

## 2022-07-01 ENCOUNTER — Ambulatory Visit: Payer: Medicare Other | Admitting: Physician Assistant

## 2022-07-16 ENCOUNTER — Other Ambulatory Visit: Payer: Self-pay | Admitting: Physician Assistant

## 2022-07-16 DIAGNOSIS — K2100 Gastro-esophageal reflux disease with esophagitis, without bleeding: Secondary | ICD-10-CM

## 2022-07-16 DIAGNOSIS — R1319 Other dysphagia: Secondary | ICD-10-CM

## 2022-08-03 DIAGNOSIS — L821 Other seborrheic keratosis: Secondary | ICD-10-CM | POA: Diagnosis not present

## 2022-08-03 DIAGNOSIS — L814 Other melanin hyperpigmentation: Secondary | ICD-10-CM | POA: Diagnosis not present

## 2022-08-03 DIAGNOSIS — L578 Other skin changes due to chronic exposure to nonionizing radiation: Secondary | ICD-10-CM | POA: Diagnosis not present

## 2022-08-03 DIAGNOSIS — D692 Other nonthrombocytopenic purpura: Secondary | ICD-10-CM | POA: Diagnosis not present

## 2022-08-03 DIAGNOSIS — I872 Venous insufficiency (chronic) (peripheral): Secondary | ICD-10-CM | POA: Diagnosis not present

## 2022-08-03 DIAGNOSIS — L570 Actinic keratosis: Secondary | ICD-10-CM | POA: Diagnosis not present

## 2022-08-03 DIAGNOSIS — Z85828 Personal history of other malignant neoplasm of skin: Secondary | ICD-10-CM | POA: Diagnosis not present

## 2022-08-03 DIAGNOSIS — D225 Melanocytic nevi of trunk: Secondary | ICD-10-CM | POA: Diagnosis not present

## 2022-08-04 ENCOUNTER — Ambulatory Visit (INDEPENDENT_AMBULATORY_CARE_PROVIDER_SITE_OTHER): Payer: Medicare Other | Admitting: *Deleted

## 2022-08-04 DIAGNOSIS — Z0000 Encounter for general adult medical examination without abnormal findings: Secondary | ICD-10-CM | POA: Diagnosis not present

## 2022-08-04 NOTE — Patient Instructions (Signed)
[name] , Thank you for taking time to come for your Medicare Wellness Visit. I appreciate your ongoing commitment to your health goals. Please review the following plan we discussed and let me know if I can assist you in the future.     This is a list of the screening recommended for you and due dates:  Health Maintenance  Topic Date Due   COVID-19 Vaccine (6 - 2023-24 season) 10/30/2021   Flu Shot  09/30/2022   Medicare Annual Wellness Visit  08/04/2023   DTaP/Tdap/Td vaccine (3 - Td or Tdap) 03/07/2031   Pneumonia Vaccine  Completed   DEXA scan (bone density measurement)  Completed   Hepatitis C Screening  Completed   Zoster (Shingles) Vaccine  Completed   HPV Vaccine  Aged Out   Colon Cancer Screening  Discontinued    Next appointment: Follow up in one year for your annual wellness visit.   Preventive Care 11 Years and Older, Female Preventive care refers to lifestyle choices and visits with your health care provider that can promote health and wellness. What does preventive care include? A yearly physical exam. This is also called an annual well check. Dental exams once or twice a year. Routine eye exams. Ask your health care provider how often you should have your eyes checked. Personal lifestyle choices, including: Daily care of your teeth and gums. Regular physical activity. Eating a healthy diet. Avoiding tobacco and drug use. Limiting alcohol use. Practicing safe sex. Taking low-dose aspirin every day. Taking vitamin and mineral supplements as recommended by your health care provider. What happens during an annual well check? The services and screenings done by your health care provider during your annual well check will depend on your age, overall health, lifestyle risk factors, and family history of disease. Counseling  Your health care provider may ask you questions about your: Alcohol use. Tobacco use. Drug use. Emotional well-being. Home and relationship  well-being. Sexual activity. Eating habits. History of falls. Memory and ability to understand (cognition). Work and work Astronomer. Reproductive health. Screening  You may have the following tests or measurements: Height, weight, and BMI. Blood pressure. Lipid and cholesterol levels. These may be checked every 5 years, or more frequently if you are over 55 years old. Skin check. Lung cancer screening. You may have this screening every year starting at age 38 if you have a 30-pack-year history of smoking and currently smoke or have quit within the past 15 years. Fecal occult blood test (FOBT) of the stool. You may have this test every year starting at age 63. Flexible sigmoidoscopy or colonoscopy. You may have a sigmoidoscopy every 5 years or a colonoscopy every 10 years starting at age 25. Hepatitis C blood test. Hepatitis B blood test. Sexually transmitted disease (STD) testing. Diabetes screening. This is done by checking your blood sugar (glucose) after you have not eaten for a while (fasting). You may have this done every 1-3 years. Bone density scan. This is done to screen for osteoporosis. You may have this done starting at age 46. Mammogram. This may be done every 1-2 years. Talk to your health care provider about how often you should have regular mammograms. Talk with your health care provider about your test results, treatment options, and if necessary, the need for more tests. Vaccines  Your health care provider may recommend certain vaccines, such as: Influenza vaccine. This is recommended every year. Tetanus, diphtheria, and acellular pertussis (Tdap, Td) vaccine. You may need a Td  booster every 10 years. Zoster vaccine. You may need this after age 42. Pneumococcal 13-valent conjugate (PCV13) vaccine. One dose is recommended after age 42. Pneumococcal polysaccharide (PPSV23) vaccine. One dose is recommended after age 55. Talk to your health care provider about which  screenings and vaccines you need and how often you need them. This information is not intended to replace advice given to you by your health care provider. Make sure you discuss any questions you have with your health care provider. Document Released: 03/14/2015 Document Revised: 11/05/2015 Document Reviewed: 12/17/2014 Elsevier Interactive Patient Education  2017 [employer].  Fall Prevention in the Home Falls can cause injuries. They can happen to people of all ages. There are many things you can do to make your home safe and to help prevent falls. What can I do on the outside of my home? Regularly fix the edges of walkways and driveways and fix any cracks. Remove anything that might make you trip as you walk through a door, such as a raised step or threshold. Trim any bushes or trees on the path to your home. Use bright outdoor lighting. Clear any walking paths of anything that might make someone trip, such as rocks or tools. Regularly check to see if handrails are loose or broken. Make sure that both sides of any steps have handrails. Any raised decks and porches should have guardrails on the edges. Have any leaves, snow, or ice cleared regularly. Use sand or salt on walking paths during winter. Clean up any spills in your garage right away. This includes oil or grease spills. What can I do in the bathroom? Use night lights. Install grab bars by the toilet and in the tub and shower. Do not use towel bars as grab bars. Use non-skid mats or decals in the tub or shower. If you need to sit down in the shower, use a plastic, non-slip stool. Keep the floor dry. Clean up any water that spills on the floor as soon as it happens. Remove soap buildup in the tub or shower regularly. Attach bath mats securely with double-sided non-slip rug tape. Do not have throw rugs and other things on the floor that can make you trip. What can I do in the bedroom? Use night lights. Make sure that you have a  light by your bed that is easy to reach. Do not use any sheets or blankets that are too big for your bed. They should not hang down onto the floor. Have a firm chair that has side arms. You can use this for support while you get dressed. Do not have throw rugs and other things on the floor that can make you trip. What can I do in the kitchen? Clean up any spills right away. Avoid walking on wet floors. Keep items that you use a lot in easy-to-reach places. If you need to reach something above you, use a strong step stool that has a grab bar. Keep electrical cords out of the way. Do not use floor polish or wax that makes floors slippery. If you must use wax, use non-skid floor wax. Do not have throw rugs and other things on the floor that can make you trip. What can I do with my stairs? Do not leave any items on the stairs. Make sure that there are handrails on both sides of the stairs and use them. Fix handrails that are broken or loose. Make sure that handrails are as long as the stairways. Check any carpeting  to make sure that it is firmly attached to the stairs. Fix any carpet that is loose or worn. Avoid having throw rugs at the top or bottom of the stairs. If you do have throw rugs, attach them to the floor with carpet tape. Make sure that you have a light switch at the top of the stairs and the bottom of the stairs. If you do not have them, ask someone to add them for you. What else can I do to help prevent falls? Wear shoes that: Do not have high heels. Have rubber bottoms. Are comfortable and fit you well. Are closed at the toe. Do not wear sandals. If you use a stepladder: Make sure that it is fully opened. Do not climb a closed stepladder. Make sure that both sides of the stepladder are locked into place. Ask someone to hold it for you, if possible. Clearly mark and make sure that you can see: Any grab bars or handrails. First and last steps. Where the edge of each step  is. Use tools that help you move around (mobility aids) if they are needed. These include: Canes. Walkers. Scooters. Crutches. Turn on the lights when you go into a dark area. Replace any light bulbs as soon as they burn out. Set up your furniture so you have a clear path. Avoid moving your furniture around. If any of your floors are uneven, fix them. If there are any pets around you, be aware of where they are. Review your medicines with your doctor. Some medicines can make you feel dizzy. This can increase your chance of falling. Ask your doctor what other things that you can do to help prevent falls. This information is not intended to replace advice given to you by your health care provider. Make sure you discuss any questions you have with your health care provider. Document Released: 12/12/2008 Document Revised: 07/24/2015 Document Reviewed: 03/22/2014 Elsevier Interactive Patient Education  2017 [employer].

## 2022-08-04 NOTE — Progress Notes (Signed)
Subjective:   Ruth Wolfe is a 77 y.o. female who presents for Medicare Annual (Subsequent) preventive examination.  I connected with  Ruth Wolfe on 08/04/22 by a audio enabled telemedicine application and verified that I am speaking with the correct person using two identifiers.  Patient Location: Home  Provider Location: Office/Clinic  I discussed the limitations of evaluation and management by telemedicine. The patient expressed understanding and agreed to proceed.   Review of Systems     Cardiac Risk Factors include: advanced age (>49men, >88 women)     Objective:    Today's Vitals   There is no height or weight on file to calculate BMI.     08/04/2022   10:20 AM 02/11/2022   10:57 AM 11/10/2021   10:40 AM 10/22/2021    8:16 AM 07/02/2021   11:48 AM 11/03/2016    1:45 PM 11/30/2015    9:37 AM  Advanced Directives  Does Patient Have a Medical Advance Directive? Yes No No No Yes Yes No  Type of Advance Directive Living will;Healthcare Power of Attorney    Living will Living will   Copy of Healthcare Power of Attorney in Chart? No - copy requested        Would patient like information on creating a medical advance directive?  No - Patient declined No - Patient declined No - Patient declined       Current Medications (verified) Outpatient Encounter Medications as of 08/04/2022  Medication Sig   brimonidine (ALPHAGAN) 0.2 % ophthalmic solution Place 1 drop into both eyes 2 (two) times daily.   Cholecalciferol (VITAMIN D) 50 MCG (2000 UT) tablet Take 4,000 Units by mouth daily.   famotidine (PEPCID) 40 MG tablet TAKE 1 TABLET BY MOUTH EVERYDAY AT BEDTIME   fluticasone (FLONASE) 50 MCG/ACT nasal spray SPRAY 2 SPRAYS INTO EACH NOSTRIL EVERY DAY   Melatonin 10 MG TABS Take 1 tablet by mouth at bedtime.   MIEBO 1.338 GM/ML SOLN Apply 1 drop to eye 4 (four) times daily.   naproxen sodium (ALEVE) 220 MG tablet Take 220 mg by mouth as needed.   vitamin B-12 (CYANOCOBALAMIN) 1000  MCG tablet Take 1,000 mcg by mouth daily.   WIXELA INHUB 250-50 MCG/ACT AEPB INHALE 1 PUFF INTO THE LUNGS IN THE MORNING AND AT BEDTIME.   No facility-administered encounter medications on file as of 08/04/2022.    Allergies (verified) Misc. sulfonamide containing compounds and Sulfa antibiotics   History: Past Medical History:  Diagnosis Date   Allergy 2013   Arthritis 1980   Asthma 2000   Breast cancer (HCC) 09/22/2021   DCIS   Bruit    Per patient   Cataracts, both eyes 2021   surgery 2022   Colon polyps    Environmental allergies 2013   Fibromyalgia    GERD (gastroesophageal reflux disease) 2013   Glaucoma 2016   POAG OU   History of hiatal hernia    Hyperlipidemia 2014   Hypertensive retinopathy 2021   OU   Macular degeneration 2014   Neuropathy 2013   legs and arms   Osteopenia 2000   Osteoporosis    Paralyzed vocal cords 2013   Per patient   Tongue atrophy 2013   Per patient r/t paralyzed vocal cords   Vitamin B12 deficiency 2021   Vitamin D deficiency 2021   Past Surgical History:  Procedure Laterality Date   APPENDECTOMY  1981   with hysterectomy   BREAST LUMPECTOMY WITH RADIOACTIVE SEED LOCALIZATION  Left 10/28/2021   Procedure: LEFT BREAST SEED LUMPECTOMY;  Surgeon: Harriette Bouillon, MD;  Location: [facility] OR;  Service: General;  Laterality: Left;   CATARACT EXTRACTION Bilateral    CERVICAL SPINE SURGERY  01/30/2020   Per patient   TOTAL ABDOMINAL HYSTERECTOMY  1981   Family History  Problem Relation Age of Onset   Hypertension Mother    Lung cancer Father        he smoked   Glaucoma Father    Gout Father    Alzheimer's disease Father    Hypertension Sister    Liver disease Sister    Brain cancer Cousin        glioblastoma, paternal first cousin   Colon cancer Neg Hx    Stomach cancer Neg Hx    Social History   Socioeconomic History   Marital status: Divorced    Spouse name: Not on file   Number of children: 0   Years of education: Not on  file   Highest education level: Not on file  Occupational History   Occupation: retired  Tobacco Use   Smoking status: Former    Types: Cigarettes   Smokeless tobacco: Never   Tobacco comments:    College years  Building services engineer Use: Never used  Substance and Sexual Activity   Alcohol use: Yes    Alcohol/week: 5.0 - 6.0 standard drinks of alcohol    Types: 5 - 6 Cans of beer per week    Comment: 2-3 per day   Drug use: No   Sexual activity: Not on file  Other Topics Concern   Not on file  Social History Narrative   Not on file   Social Determinants of Health   Financial Resource Strain: Low Risk  (07/02/2021)   Overall Financial Resource Strain (CARDIA)    Difficulty of Paying Living Expenses: Not hard at all  Food Insecurity: No Food Insecurity (08/04/2022)   Hunger Vital Sign    Worried About Running Out of Food in the Last Year: Never true    Ran Out of Food in the Last Year: Never true  Transportation Needs: No Transportation Needs (08/04/2022)   PRAPARE - Administrator, Civil Service (Medical): No    Lack of Transportation (Non-Medical): No  Physical Activity: Sufficiently Active (07/02/2021)   Exercise Vital Sign    Days of Exercise per Week: 3 days    Minutes of Exercise per Session: 50 min  Stress: No Stress Concern Present (07/02/2021)   [employer] of Occupational Health - Occupational Stress Questionnaire    Feeling of Stress : Not at all  Social Connections: Moderately Integrated (08/04/2022)   Social Connection and Isolation Panel [NHANES]    Frequency of Communication with Friends and Family: More than three times a week    Frequency of Social Gatherings with Friends and Family: More than three times a week    Attends Religious Services: More than 4 times per year    Active Member of [employer] or Organizations: Yes    Attends Engineer, structural: More than 4 times per year    Marital Status: Divorced    Tobacco Counseling Counseling  given: Not Answered Tobacco comments: College years   Clinical Intake:  Pre-visit preparation completed: Yes  Pain : No/denies pain  Nutritional Risks: None Diabetes: No  How often do you need to have someone help you when you read instructions, pamphlets, or other written materials from your doctor or pharmacy?:  1 - Never   Activities of Daily Living    08/04/2022   10:27 AM 10/22/2021    8:21 AM  In your present state of health, do you have any difficulty performing the following activities:  Hearing? 1   Comment some slight hearing loss   Vision? 1   Comment macular degeneration   Difficulty concentrating or making decisions? 0   Walking or climbing stairs? 0   Dressing or bathing? 0   Doing errands, shopping? 0 0  Preparing Food and eating ? N   Using the Toilet? N   In the past six months, have you accidently leaked urine? N   Do you have problems with loss of bowel control? N   Managing your Medications? N   Managing your Finances? N   Housekeeping or managing your Housekeeping? N     Patient Care Team: Olive Bass, FNP as PCP - General (Internal Medicine) Harriette Bouillon, MD as Consulting Physician (General Surgery) Serena Croissant, MD as Consulting Physician (Hematology and Oncology) Antony Blackbird, MD as Consulting Physician (Radiation Oncology)  Indicate any recent Medical Services you may have received from other than [facility] providers in the past year (date may be approximate).     Assessment:   This is a routine wellness examination for [facility].  Hearing/Vision screen No results found.  Dietary issues and exercise activities discussed: Current Exercise Habits: Home exercise routine;Structured exercise class (senior boxing league), Type of exercise: walking;strength training/weights, Time (Minutes): 45, Frequency (Times/Week): 4, Weekly Exercise (Minutes/Week): 180, Intensity: Mild, Exercise limited by: None identified   Goals Addressed   None     Depression Screen    08/04/2022   10:26 AM 11/20/2021    3:54 PM 07/07/2021    9:33 AM 07/02/2021   11:50 AM 06/16/2021    1:43 PM 03/19/2021    9:13 AM 03/03/2021    9:24 AM  PHQ 2/9 Scores  PHQ - 2 Score 0 0 0 0 0 0 0    Fall Risk    08/04/2022   10:22 AM 11/20/2021    3:54 PM 07/07/2021    9:33 AM 07/02/2021   11:50 AM 06/16/2021    1:42 PM  Fall Risk   Falls in the past year? 0 0 0 0 0  Number falls in past yr: 0 0 0 0 0  Injury with Fall? 0 0 0 0 0  Risk for fall due to : No Fall Risks No Fall Risks  Medication side effect No Fall Risks  Follow up Falls evaluation completed Falls evaluation completed  Falls evaluation completed;Education provided;Falls prevention discussed Falls evaluation completed    FALL RISK PREVENTION PERTAINING TO THE HOME:  Any stairs in or around the home? Yes  If so, are there any without handrails? No  Home free of loose throw rugs in walkways, pet beds, electrical cords, etc? Yes  Adequate lighting in your home to reduce risk of falls? Yes   ASSISTIVE DEVICES UTILIZED TO PREVENT FALLS:  Life alert? No  Use of a cane, walker or w/c? No  Grab bars in the bathroom? No  Shower chair or bench in shower? No  Elevated toilet seat or a handicapped toilet?  Comfort height  TIMED UP AND GO:  Was the test performed?  No, audio visit .    Cognitive Function:        08/04/2022   10:39 AM 07/02/2021   11:52 AM  6CIT Screen  What Year? 0 points 0  points  What month? 0 points 0 points  What time? 0 points 0 points  Count back from 20 0 points 0 points  Months in reverse 0 points 0 points  Repeat phrase 0 points 2 points  Total Score 0 points 2 points    Immunizations Immunization History  Administered Date(s) Administered   Influenza, High Dose Seasonal PF 11/08/2016, 12/10/2019   Influenza-Unspecified 12/14/2015, 11/29/2017, 12/23/2020   PFIZER(Purple Top)SARS-COV-2 Vaccination 03/15/2019, 04/05/2019, 11/25/2019, 06/19/2020   PNEUMOCOCCAL  CONJUGATE-20 03/03/2021   Pfizer Covid-19 Vaccine Bivalent Booster 49yrs & up 11/19/2020   Pneumococcal Polysaccharide-23 08/18/2010, 10/13/2012   Tdap 10/18/2010, 03/06/2021   Zoster Recombinat (Shingrix) 10/05/2018, 12/18/2018    TDAP status: Up to date  Flu Vaccine status: Up to date  Pneumococcal vaccine status: Up to date  Covid-19 vaccine status: Information provided on how to obtain vaccines.   Qualifies for Shingles Vaccine? Yes   Zostavax completed No   Shingrix Completed?: Yes  Screening Tests Health Maintenance  Topic Date Due   COVID-19 Vaccine (6 - 2023-24 season) 10/30/2021   Medicare Annual Wellness (AWV)  07/03/2022   INFLUENZA VACCINE  09/30/2022   DTaP/Tdap/Td (3 - Td or Tdap) 03/07/2031   Pneumonia Vaccine 31+ Years old  Completed   DEXA SCAN  Completed   Hepatitis C Screening  Completed   Zoster Vaccines- Shingrix  Completed   HPV VACCINES  Aged Out   Colonoscopy  Discontinued    Health Maintenance  Health Maintenance Due  Topic Date Due   COVID-19 Vaccine (6 - 2023-24 season) 10/30/2021   Medicare Annual Wellness (AWV)  07/03/2022    Colorectal cancer screening: Type of screening: Colonoscopy. Completed 11/17/16. Repeat every N/a years  Mammogram status: Completed 09/14/21. Repeat every year  Bone Density status: Completed 02/24/22. Results reflect: Bone density results: OSTEOPOROSIS. Repeat every 2 years.  Lung Cancer Screening: (Low Dose CT Chest recommended if Age 61-80 years, 30 pack-year currently smoking OR have quit w/in 15years.) does not qualify.    Additional Screening:  Hepatitis C Screening: does qualify; Completed 07/07/21  Vision Screening: Recommended annual ophthalmology exams for early detection of glaucoma and other disorders of the eye. Is the patient up to date with their annual eye exam?  Yes  Who is the provider or what is the name of the office in which the patient attends annual eye exams? Dr. [facility] If pt is  not established with a provider, would they like to be referred to a provider to establish care? No .   Dental Screening: Recommended annual dental exams for proper oral hygiene  Community Resource Referral / Chronic Care Management: CRR required this visit?  No   CCM required this visit?  No      Plan:     I have personally reviewed and noted the following in the patient's chart:   Medical and social history Use of alcohol, tobacco or illicit drugs  Current medications and supplements including opioid prescriptions. Patient is not currently taking opioid prescriptions. Functional ability and status Nutritional status Physical activity Advanced directives List of other physicians Hospitalizations, surgeries, and ER visits in previous 12 months Vitals Screenings to include cognitive, depression, and falls Referrals and appointments  In addition, I have reviewed and discussed with patient certain preventive protocols, quality metrics, and best practice recommendations. A written personalized care plan for preventive services as well as general preventive health recommendations were provided to patient.   Due to this being a telephonic visit, the after  visit summary with patients personalized plan was offered to patient via mail or my-chart. Patient would like to access on my-chart.  Donne Anon, [place]   08/04/2022   Nurse Notes: None

## 2022-08-19 ENCOUNTER — Ambulatory Visit: Payer: Medicare Other | Admitting: Family

## 2022-08-19 NOTE — Progress Notes (Signed)
09/01/2022 Ruth Wolfe [id number] 1945-12-07  Referring provider: Olive Bass,* Primary GI doctor: Dr. Russella Dar  ASSESSMENT AND PLAN:   GERD with some visual dysphagia Never had upper endoscopy CT abdomen pelvis with contrast unremarkable in February Barium swallow in March with esophageal dysmotility no hiatal hernia no reflux no stricture Patient continues to describe episodes of regurgitation, dysphagia to meats primarily. Patient's states she has adverse effects to medications and refuses PPI, did trial of Pepcid but states this was also a PPI and cause the same symptoms. I think at this point with patient's symptoms would benefit from upper endoscopy with possible empiric dilation for further evaluation.  I think this may also help clarify if there is esophagitis or gastritis and help convince the patient that she needs a medication. In the meantime continue to void milk products, try FD guard, GERD information given.  She also drinks 1 beer and 2 glasses of wine nightly, advised to cut back on this She also had a friend that had a perforation after a transesophageal echo is very fearful of the procedure, I spent 203-0 minutes talking with her about this, she still declines at this time we will try the above-mentioned things and will talk with Dr. Russella Dar at her follow-up  Left upper and lower quadrant abdominal discomfort Alternating constipation diarrhea 2018 colonoscopy with tortuous colon, narrowing of colon associate with diverticular opening No recall due to age Discussed with the patient that with her colon anatomy likely will have slow motility, and potential spasms. Denies hematochezia. Suggest continuing fiber, can do MiraLAX, can consider Linzess or hyoscyamine, patient declines any medications.  Patient Care Team: Olive Bass, FNP as PCP - General (Internal Medicine) Harriette Bouillon, MD as Consulting Physician (General Surgery) Serena Croissant, MD  as Consulting Physician (Hematology and Oncology) Antony Blackbird, MD as Consulting Physician (Radiation Oncology)  HISTORY OF PRESENT ILLNESS: 77 y.o. female with a past medical history of hyperlipidemia, hypertensive retinopathy, breast cancer, B12 deficiency, vitamin D deficiency, history of hiatal hernia with GERD and others listed below presents for evaluation of GERD.   11/17/2016 colonoscopy Dr. Russella Dar for personal history of adenomatous polyps, good prep difficult due to tortuous colon moderate diverticulosis with narrowing in the colon associated diverticular opening peridiverticular erythema internal hemorrhoids no repeat: Due to age. 04/02/2022 CT abdomen pelvis with contrast for abdominal pain for 3 weeks showed no acute intra-abdominal or intrapelvic process. 04/20/2022 office visit with myself for esophageal dysphagia with some epigastric discomfort and abdominal fullness patient was hesitant to do endoscopy or get on medications due to previous reactions in the past. Started on Pepcid, told to avoid lactose, negative celiac, negative H. pylori 05/06/2022 barium swallow with tablet showed mild esophageal dysmotility, no hiatal hernia, no reflux no stricture. Patient presents today to discuss symptoms, and potential endoscopy.  She states she started on the pepcid but read that it was a PPI so she stopped taking it.  She has had 2 episodes of GERD where she had vomit.  She is hoarse at this time.  She has beer in the afternoon and wine at night.  She will feel meats getting stuck still and can have regurgitation or make her vomit from this.  She also has had left upper and lower AB cramping that felt like punch with nausea, going to pass out, need to have a BM. She has to lay down for 30 mins and it will go away, it has happened twice.  She  is having joint pain all over, she is on aleve AS needed, very rare.  No tobacco use, no drug use.  States she does not tolerate medications well. She  is very anxious, had a friend that had perforation with TEE and she is very anxious person.   She  reports that she has quit smoking. Her smoking use included cigarettes. She has never used smokeless tobacco. She reports current alcohol use of about 5.0 - 6.0 standard drinks of alcohol per week. She reports that she does not use drugs.  RELEVANT LABS AND IMAGING: CBC    Component Value Date/Time   WBC 7.0 08/20/2022 1151   RBC 3.97 08/20/2022 1151   HGB 13.2 08/20/2022 1151   HGB 13.6 09/30/2021 1209   HCT 40.1 08/20/2022 1151   PLT 263.0 08/20/2022 1151   PLT 250 09/30/2021 1209   MCV 100.9 (H) 08/20/2022 1151   MCV 95.6 04/04/2015 1023   MCH 33.3 09/30/2021 1209   MCHC 32.9 08/20/2022 1151   RDW 13.4 08/20/2022 1151   LYMPHSABS 1.7 08/20/2022 1151   MONOABS 0.7 08/20/2022 1151   EOSABS 0.5 08/20/2022 1151   BASOSABS 0.0 08/20/2022 1151   Recent Labs    09/30/21 1209 03/23/22 1107 04/20/22 1133 08/20/22 1151  HGB 13.6 13.5 13.9 13.2     CMP     Component Value Date/Time   NA 139 08/20/2022 1151   K 4.2 08/20/2022 1151   CL 103 08/20/2022 1151   CO2 28 08/20/2022 1151   GLUCOSE 88 08/20/2022 1151   BUN 11 08/20/2022 1151   CREATININE 0.83 08/20/2022 1151   CREATININE 0.81 09/30/2021 1230   CALCIUM 9.2 08/20/2022 1151   PROT 6.7 08/20/2022 1151   ALBUMIN 4.0 08/20/2022 1151   AST 17 08/20/2022 1151   AST 17 09/30/2021 1230   ALT 16 08/20/2022 1151   ALT 15 09/30/2021 1230   ALKPHOS 66 08/20/2022 1151   BILITOT 1.2 08/20/2022 1151   BILITOT 1.1 09/30/2021 1230   GFRNONAA >60 09/30/2021 1230      Latest Ref Rng & Units 08/20/2022   11:51 AM 04/20/2022   11:33 AM 03/23/2022   11:07 AM  Hepatic Function  Total Protein 6.0 - 8.3 g/dL 6.7  7.3  6.9   Albumin 3.5 - 5.2 g/dL 4.0  4.2  4.2   AST 0 - 37 U/L 17  20  19    ALT 0 - 35 U/L 16  21  17    Alk Phosphatase 39 - 117 U/L 66  61  50   Total Bilirubin 0.2 - 1.2 mg/dL 1.2  0.9  0.8       Current  Medications:     Current Outpatient Medications (Respiratory):    fluticasone (FLONASE) 50 MCG/ACT nasal spray, SPRAY 2 SPRAYS INTO EACH NOSTRIL EVERY DAY   WIXELA INHUB 250-50 MCG/ACT AEPB, INHALE 1 PUFF INTO THE LUNGS IN THE MORNING AND AT BEDTIME.  Current Outpatient Medications (Analgesics):    naproxen sodium (ALEVE) 220 MG tablet, Take 220 mg by mouth as needed.  Current Outpatient Medications (Hematological):    vitamin B-12 (CYANOCOBALAMIN) 1000 MCG tablet, Take 1,000 mcg by mouth daily.  Current Outpatient Medications (Other):    brimonidine (ALPHAGAN) 0.2 % ophthalmic solution, Place 1 drop into both eyes 2 (two) times daily.   Cholecalciferol (VITAMIN D) 50 MCG (2000 UT) tablet, Take 4,000 Units by mouth daily.   Melatonin 10 MG TABS, Take 1 tablet by mouth at bedtime.  RHOPRESSA 0.02 % SOLN, Place 1 drop into both eyes daily.   famotidine (PEPCID) 40 MG tablet, TAKE 1 TABLET BY MOUTH EVERYDAY AT BEDTIME (Patient not taking: Reported on 09/01/2022)   MIEBO 1.338 GM/ML SOLN, Apply 1 drop to eye 4 (four) times daily. (Patient not taking: Reported on 09/01/2022)  Medical History:  Past Medical History:  Diagnosis Date   Allergy 2013   Arthritis 1980   Asthma 2000   Breast cancer (HCC) 09/22/2021   DCIS   Bruit    Per patient   Cataracts, both eyes 2021   surgery 2022   Colon polyps    Environmental allergies 2013   Fibromyalgia    GERD (gastroesophageal reflux disease) 2013   Glaucoma 2016   POAG OU   History of hiatal hernia    Hyperlipidemia 2014   Hypertensive retinopathy 2021   OU   Macular degeneration 2014   Neuropathy 2013   legs and arms   Osteopenia 2000   Osteoporosis    Paralyzed vocal cords 2013   Per patient   Tongue atrophy 2013   Per patient r/t paralyzed vocal cords   Vitamin B12 deficiency 2021   Vitamin D deficiency 2021   Allergies:  Allergies  Allergen Reactions   Misc. Sulfonamide Containing Compounds Other (See Comments)   Sulfa  Antibiotics     Wheezing, swelling face and ears     Surgical History:  She  has a past surgical history that includes Total abdominal hysterectomy (1981); Appendectomy (1981); Cataract extraction (Bilateral); Cervical spine surgery (01/30/2020); and Breast lumpectomy with radioactive seed localization (Left, 10/28/2021). Family History:  Her family history includes Alzheimer's disease in her father; Brain cancer in her cousin; Glaucoma in her father; Gout in her father; Hypertension in her mother and sister; Liver disease in her sister; Lung cancer in her father.  REVIEW OF SYSTEMS  : All other systems reviewed and negative except where noted in the History of Present Illness.  PHYSICAL EXAM: BP 126/68   Pulse 65   Ht 5\' 1"  (1.549 m)   Wt 151 lb 1.6 oz (68.5 kg)   BMI 28.55 kg/m  General Appearance: Well nourished, in no apparent distress. Head:   Normocephalic and atraumatic. Eyes:  sclerae anicteric,conjunctive pink  Respiratory: Respiratory effort normal, BS equal bilaterally without rales, rhonchi, wheezing. Cardio: RRR with no MRGs. Peripheral pulses intact.  Abdomen: Soft,  Obese ,active bowel sounds. No tenderness. No masses. Rectal: declines Musculoskeletal: Full ROM, Normal gait. Without edema. Skin:  Dry and intact without significant lesions or rashes Neuro: Alert and  oriented x4;  No focal deficits. Psych:  Cooperative. Normal mood and affect.    Doree Albee, PA-C 9:38 AM

## 2022-08-20 ENCOUNTER — Ambulatory Visit (INDEPENDENT_AMBULATORY_CARE_PROVIDER_SITE_OTHER): Payer: Medicare Other | Admitting: Family

## 2022-08-20 ENCOUNTER — Encounter: Payer: Self-pay | Admitting: Family

## 2022-08-20 VITALS — BP 112/64 | HR 77 | Temp 98.2°F | Ht 61.25 in | Wt 151.6 lb

## 2022-08-20 DIAGNOSIS — R5383 Other fatigue: Secondary | ICD-10-CM

## 2022-08-20 DIAGNOSIS — Z13220 Encounter for screening for lipoid disorders: Secondary | ICD-10-CM | POA: Diagnosis not present

## 2022-08-20 DIAGNOSIS — R7309 Other abnormal glucose: Secondary | ICD-10-CM

## 2022-08-20 DIAGNOSIS — R7989 Other specified abnormal findings of blood chemistry: Secondary | ICD-10-CM | POA: Diagnosis not present

## 2022-08-20 DIAGNOSIS — E559 Vitamin D deficiency, unspecified: Secondary | ICD-10-CM

## 2022-08-20 LAB — CBC WITH DIFFERENTIAL/PLATELET
Basophils Absolute: 0 10*3/uL (ref 0.0–0.1)
Basophils Relative: 0.4 % (ref 0.0–3.0)
Eosinophils Absolute: 0.5 10*3/uL (ref 0.0–0.7)
Eosinophils Relative: 7.9 % — ABNORMAL HIGH (ref 0.0–5.0)
HCT: 40.1 % (ref 36.0–46.0)
Hemoglobin: 13.2 g/dL (ref 12.0–15.0)
Lymphocytes Relative: 24.2 % (ref 12.0–46.0)
Lymphs Abs: 1.7 10*3/uL (ref 0.7–4.0)
MCHC: 32.9 g/dL (ref 30.0–36.0)
MCV: 100.9 fl — ABNORMAL HIGH (ref 78.0–100.0)
Monocytes Absolute: 0.7 10*3/uL (ref 0.1–1.0)
Monocytes Relative: 10.4 % (ref 3.0–12.0)
Neutro Abs: 4 10*3/uL (ref 1.4–7.7)
Neutrophils Relative %: 57.1 % (ref 43.0–77.0)
Platelets: 263 10*3/uL (ref 150.0–400.0)
RBC: 3.97 Mil/uL (ref 3.87–5.11)
RDW: 13.4 % (ref 11.5–15.5)
WBC: 7 10*3/uL (ref 4.0–10.5)

## 2022-08-20 LAB — COMPREHENSIVE METABOLIC PANEL
ALT: 16 U/L (ref 0–35)
AST: 17 U/L (ref 0–37)
Albumin: 4 g/dL (ref 3.5–5.2)
Alkaline Phosphatase: 66 U/L (ref 39–117)
BUN: 11 mg/dL (ref 6–23)
CO2: 28 mEq/L (ref 19–32)
Calcium: 9.2 mg/dL (ref 8.4–10.5)
Chloride: 103 mEq/L (ref 96–112)
Creatinine, Ser: 0.83 mg/dL (ref 0.40–1.20)
GFR: 67.44 mL/min (ref >60.00)
Glucose, Bld: 88 mg/dL (ref 70–99)
Potassium: 4.2 mEq/L (ref 3.5–5.1)
Sodium: 139 mEq/L (ref 135–145)
Total Bilirubin: 1.2 mg/dL (ref 0.2–1.2)
Total Protein: 6.7 g/dL (ref 6.0–8.3)

## 2022-08-20 LAB — VITAMIN D 25 HYDROXY (VIT D DEFICIENCY, FRACTURES): VITD: 61.63 ng/mL (ref 30.00–100.00)

## 2022-08-20 LAB — LIPID PANEL
Cholesterol: 223 mg/dL — ABNORMAL HIGH (ref 0–200)
HDL: 80.2 mg/dL (ref >39.00)
LDL Cholesterol: 127 mg/dL — ABNORMAL HIGH (ref 0–99)
NonHDL: 142.31
Total CHOL/HDL Ratio: 3
Triglycerides: 75 mg/dL (ref 0.0–149.0)
VLDL: 15 mg/dL (ref 0.0–40.0)

## 2022-08-20 LAB — HEMOGLOBIN A1C: Hgb A1c MFr Bld: 5.6 % (ref 4.6–6.5)

## 2022-08-20 LAB — VITAMIN B12: Vitamin B-12: 424 pg/mL (ref 211–911)

## 2022-08-20 NOTE — Patient Instructions (Signed)
Please follow up with Dr. Ethelene Hal at [facility] to discuss the chronic neck pain-

## 2022-08-20 NOTE — Progress Notes (Signed)
Ruth Wolfe is a 77 y.o. 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     Cholecalciferol (VITAMIN D) 50 MCG (2000 UT) tablet Take 4,000 Units by mouth daily.     famotidine (PEPCID) 40 MG tablet TAKE 1 TABLET BY MOUTH EVERYDAY AT BEDTIME 90 tablet 1   fluticasone (FLONASE) 50 MCG/ACT nasal spray SPRAY 2 SPRAYS INTO EACH NOSTRIL EVERY DAY 48 mL 2   Melatonin 10 MG TABS Take 1 tablet by mouth at bedtime.     naproxen sodium (ALEVE) 220 MG tablet Take 220 mg by mouth as needed.     vitamin B-12 (CYANOCOBALAMIN) 1000 MCG tablet Take 1,000 mcg by mouth daily.     WIXELA INHUB 250-50 MCG/ACT AEPB INHALE 1 PUFF INTO THE LUNGS IN THE MORNING AND AT BEDTIME. 180 each 3   MIEBO 1.338 GM/ML SOLN Apply 1 drop to eye 4 (four) times daily. (Patient not taking: Reported on 08/20/2022)     No current facility-administered medications for this visit.    Allergies: Misc. sulfonamide containing compounds and Sulfa antibiotics  Past Medical History:  Diagnosis Date   Allergy 2013    Arthritis 1980   Asthma 2000   Breast cancer (HCC) 09/22/2021   DCIS   Bruit    Per patient   Cataracts, both eyes 2021   surgery 2022   Colon polyps    Environmental allergies 2013   Fibromyalgia    GERD (gastroesophageal reflux disease) 2013   Glaucoma 2016   POAG OU   History of hiatal hernia    Hyperlipidemia 2014   Hypertensive retinopathy 2021   OU   Macular degeneration 2014   Neuropathy 2013   legs and arms   Osteopenia 2000   Osteoporosis    Paralyzed vocal cords 2013   Per patient   Tongue atrophy 2013   Per patient r/t paralyzed vocal cords   Vitamin B12 deficiency 2021   Vitamin D deficiency 2021    Past Surgical History:  Procedure Laterality Date   APPENDECTOMY  1981   with hysterectomy   BREAST LUMPECTOMY WITH RADIOACTIVE SEED LOCALIZATION Left 10/28/2021   Procedure: LEFT BREAST SEED LUMPECTOMY;  Surgeon: Harriette Bouillon, MD;  Location: [facility] OR;  Service: General;  Laterality: Left;   CATARACT EXTRACTION Bilateral    CERVICAL SPINE SURGERY  01/30/2020   Per patient   TOTAL ABDOMINAL HYSTERECTOMY  1981    Family History  Problem Relation Age of Onset   Hypertension Mother    Lung cancer Father        he smoked   Glaucoma Father  Gout Father    Alzheimer's disease Father    Hypertension Sister    Liver disease Sister    Brain cancer Cousin        glioblastoma, paternal first cousin   Colon cancer Neg Hx    Stomach cancer Neg Hx     Social History   Tobacco Use   Smoking status: Former    Types: Cigarettes   Smokeless tobacco: Never   Tobacco comments:    College years  Substance Use Topics   Alcohol use: Yes    Alcohol/week: 5.0 - 6.0 standard drinks of alcohol    Types: 5 - 6 Cans of beer per week    Comment: 2-3 per day    Subjective:   Presents for follow up on chronic care needs-  Continuing to see GI, dermatology, ophthalmology and oncology regularly;  Would like to get yearly labs updated today;     Objective:  Vitals:    08/20/22 1057  BP: 112/64  Pulse: 77  Temp: 98.2 F (36.8 C)  TempSrc: Oral  SpO2: 97%  Weight: 151 lb 9.6 oz (68.8 kg)  Height: 5' 1.25" (1.556 m)    General: Well developed, well nourished, in no acute distress  Skin : Warm and dry.  Head: Normocephalic and atraumatic  Eyes: Sclera and conjunctiva clear; pupils round and reactive to light; extraocular movements intact  Ears: External normal; canals clear; tympanic membranes normal  Oropharynx: Pink, supple. No suspicious lesions  Neck: Supple without thyromegaly, adenopathy  Lungs: Respirations unlabored; clear to auscultation bilaterally without wheeze, rales, rhonchi  CVS exam: normal rate and regular rhythm.  Musculoskeletal: No deformities; no active joint inflammation  Extremities: No edema, cyanosis, clubbing  Vessels: Symmetric bilaterally  Neurologic: Alert and oriented; speech intact; face symmetrical; moves all extremities well; CNII-XII intact without focal deficit   Assessment:  1. Low vitamin B12 level   2. Low vitamin D level   3. Lipid screening   4. Other fatigue   5. Vitamin D deficiency   6. Elevated glucose     Plan:  Age appropriate preventive healthcare needs addressed; encouraged regular eye doctor and dental exams; encouraged regular exercise; will update labs and refills as needed today; follow-up to be determined;  Time spent 30 minutes  No follow-ups on file.  Orders Placed This Encounter  Procedures   CBC with Differential/Platelet   Comp Met (CMET)   Lipid panel   B12   Vitamin D (25 hydroxy)   Hemoglobin A1c    Requested Prescriptions    No prescriptions requested or ordered in this encounter

## 2022-08-26 DIAGNOSIS — H43823 Vitreomacular adhesion, bilateral: Secondary | ICD-10-CM | POA: Diagnosis not present

## 2022-08-26 DIAGNOSIS — H401111 Primary open-angle glaucoma, right eye, mild stage: Secondary | ICD-10-CM | POA: Diagnosis not present

## 2022-08-26 DIAGNOSIS — H401122 Primary open-angle glaucoma, left eye, moderate stage: Secondary | ICD-10-CM | POA: Diagnosis not present

## 2022-08-26 DIAGNOSIS — H04123 Dry eye syndrome of bilateral lacrimal glands: Secondary | ICD-10-CM | POA: Diagnosis not present

## 2022-08-27 DIAGNOSIS — M65342 Trigger finger, left ring finger: Secondary | ICD-10-CM | POA: Diagnosis not present

## 2022-09-01 ENCOUNTER — Ambulatory Visit (INDEPENDENT_AMBULATORY_CARE_PROVIDER_SITE_OTHER): Payer: Medicare Other | Admitting: Physician Assistant

## 2022-09-01 ENCOUNTER — Encounter: Payer: Self-pay | Admitting: Physician Assistant

## 2022-09-01 VITALS — BP 126/68 | HR 65 | Ht 61.0 in | Wt 151.1 lb

## 2022-09-01 DIAGNOSIS — K2100 Gastro-esophageal reflux disease with esophagitis, without bleeding: Secondary | ICD-10-CM | POA: Diagnosis not present

## 2022-09-01 DIAGNOSIS — R195 Other fecal abnormalities: Secondary | ICD-10-CM | POA: Diagnosis not present

## 2022-09-01 DIAGNOSIS — R1319 Other dysphagia: Secondary | ICD-10-CM

## 2022-09-01 NOTE — Patient Instructions (Addendum)
We have given you samples of the following medication to take: FDGARD  We have scheduled you a follow up with Dr Russella Dar on 9/ 20/2024 at 9:30am.  Start on [facility] to try to see if this helps your stomach I suggest an EGD   FODMAP stands for fermentable oligo-, di-, mono-saccharides and polyols (1). These are the scientific terms used to classify groups of carbs that are notorious for triggering digestive symptoms like bloating, gas and stomach pain.      _______________________________________________________  If your blood pressure at your visit was 140/90 or greater, please contact your primary care physician to follow up on this.  _______________________________________________________  If you are age 79 or older, your body mass index should be between 23-30. Your Body mass index is 28.55 kg/m. If this is out of the aforementioned range listed, please consider follow up with your Primary Care Provider.  If you are age 60 or younger, your body mass index should be between 19-25. Your Body mass index is 28.55 kg/m. If this is out of the aformentioned range listed, please consider follow up with your Primary Care Provider.   ________________________________________________________  The [facility] providers would like to encourage you to use [facility] to communicate with providers for non-urgent requests or questions.  Due to long hold times on the telephone, sending your provider a message by [facility] may be a faster and more efficient way to get a response.  Please allow 48 business hours for a response.  Please remember that this is for non-urgent requests.  _______________________________________________________  It was a pleasure to see you today!  Thank you for trusting me with your gastrointestinal care!

## 2022-09-07 ENCOUNTER — Encounter (INDEPENDENT_AMBULATORY_CARE_PROVIDER_SITE_OTHER): Payer: Medicare Other | Admitting: Ophthalmology

## 2022-09-07 DIAGNOSIS — Z961 Presence of intraocular lens: Secondary | ICD-10-CM

## 2022-09-07 DIAGNOSIS — I10 Essential (primary) hypertension: Secondary | ICD-10-CM

## 2022-09-07 DIAGNOSIS — H35033 Hypertensive retinopathy, bilateral: Secondary | ICD-10-CM

## 2022-09-07 DIAGNOSIS — H04123 Dry eye syndrome of bilateral lacrimal glands: Secondary | ICD-10-CM

## 2022-09-07 DIAGNOSIS — H43823 Vitreomacular adhesion, bilateral: Secondary | ICD-10-CM

## 2022-09-07 DIAGNOSIS — H353132 Nonexudative age-related macular degeneration, bilateral, intermediate dry stage: Secondary | ICD-10-CM

## 2022-09-07 DIAGNOSIS — H401130 Primary open-angle glaucoma, bilateral, stage unspecified: Secondary | ICD-10-CM

## 2022-09-10 NOTE — Progress Notes (Signed)
[facility] - Clinic Note  09/15/2022     CHIEF COMPLAINT Patient presents for Retina Follow Up    HISTORY OF PRESENT ILLNESS: Ruth Wolfe is a 77 y.o. female who presents to the clinic today for:   HPI     Retina Follow Up   Patient presents with  Other.  In both eyes.  This started years ago.  Duration of 6 months.  I, the attending physician,  performed the HPI with the patient and updated documentation appropriately.        Comments   Patient feels that the vision is the same. She is using Rhopressa OU every day, Brimonidine OU TID, and AT's OU PRN.       Last edited by Rennis Chris, MD on 09/15/2022  5:16 PM.    Pt reports a decrease in [place] but has had a change in increase in glaucoma gtts.   Referring physician: Olive Bass, FNP [address] [place],  [place] [address]  HISTORICAL INFORMATION:  Selected notes from the MEDICAL RECORD NUMBER Referred by Dr. Baker Pierini for concern of VMT OU LEE: 03.18.21 [place] Solders) [BCVA: OD: 20/30-- OS: 20/50] Ocular Hx-POAG OU, VMT OU, DES OU, cataracts OU, HTN ret OU PMH-HLD   CURRENT MEDICATIONS: Current Outpatient Medications (Ophthalmic Drugs)  Medication Sig   brimonidine (ALPHAGAN) 0.2 % ophthalmic solution Place 1 drop into both eyes 2 (two) times daily.   MIEBO 1.338 GM/ML SOLN Apply 1 drop to eye 4 (four) times daily. (Patient not taking: Reported on 09/01/2022)   RHOPRESSA 0.02 % SOLN Place 1 drop into both eyes daily.   No current facility-administered medications for this visit. (Ophthalmic Drugs)   Current Outpatient Medications (Other)  Medication Sig   Cholecalciferol (VITAMIN D) 50 MCG (2000 UT) tablet Take 4,000 Units by mouth daily.   famotidine (PEPCID) 40 MG tablet TAKE 1 TABLET BY MOUTH EVERYDAY AT BEDTIME (Patient not taking: Reported on 09/01/2022)   fluticasone (FLONASE) 50 MCG/ACT nasal spray SPRAY 2 SPRAYS INTO EACH NOSTRIL EVERY DAY   Melatonin 10 MG  TABS Take 1 tablet by mouth at bedtime.   naproxen sodium (ALEVE) 220 MG tablet Take 220 mg by mouth as needed.   vitamin B-12 (CYANOCOBALAMIN) 1000 MCG tablet Take 1,000 mcg by mouth daily.   WIXELA INHUB 250-50 MCG/ACT AEPB INHALE 1 PUFF INTO THE LUNGS IN THE MORNING AND AT BEDTIME.   No current facility-administered medications for this visit. (Other)   REVIEW OF SYSTEMS: ROS   Positive for: Gastrointestinal, Musculoskeletal, Eyes, Respiratory Negative for: Constitutional, Neurological, Skin, Genitourinary, HENT, Endocrine, Cardiovascular, Psychiatric, Allergic/Imm, Heme/Lymph Last edited by Charlette Caffey, COT on 09/15/2022  2:13 PM.      ALLERGIES Allergies  Allergen Reactions   Misc. Sulfonamide Containing Compounds Other (See Comments)   Sulfa Antibiotics     Wheezing, swelling face and ears   PAST MEDICAL HISTORY Past Medical History:  Diagnosis Date   Allergy 2013   Arthritis 1980   Asthma 2000   Breast cancer (HCC) 09/22/2021   DCIS   Bruit    Per patient   Cataracts, both eyes 2021   surgery 2022   Colon polyps    Environmental allergies 2013   Fibromyalgia    GERD (gastroesophageal reflux disease) 2013   Glaucoma 2016   POAG OU   History of hiatal hernia    Hyperlipidemia 2014   Hypertensive retinopathy 2021   OU  Macular degeneration 2014   Neuropathy 2013   legs and arms   Osteopenia 2000   Osteoporosis    Paralyzed vocal cords 2013   Per patient   Tongue atrophy 2013   Per patient r/t paralyzed vocal cords   Vitamin B12 deficiency 2021   Vitamin D deficiency 2021   Past Surgical History:  Procedure Laterality Date   APPENDECTOMY  1981   with hysterectomy   BREAST LUMPECTOMY WITH RADIOACTIVE SEED LOCALIZATION Left 10/28/2021   Procedure: LEFT BREAST SEED LUMPECTOMY;  Surgeon: Harriette Bouillon, MD;  Location: [facility] OR;  Service: General;  Laterality: Left;   CATARACT EXTRACTION Bilateral    CERVICAL SPINE SURGERY  01/30/2020   Per patient    TOTAL ABDOMINAL HYSTERECTOMY  1981   FAMILY HISTORY Family History  Problem Relation Age of Onset   Hypertension Mother    Lung cancer Father        he smoked   Glaucoma Father    Gout Father    Alzheimer's disease Father    Hypertension Sister    Liver disease Sister    Brain cancer Cousin        glioblastoma, paternal first cousin   Colon cancer Neg Hx    Stomach cancer Neg Hx    SOCIAL HISTORY Social History   Tobacco Use   Smoking status: Former    Types: Cigarettes   Smokeless tobacco: Never   Tobacco comments:    College years  Vaping Use   Vaping status: Never Used  Substance Use Topics   Alcohol use: Yes    Alcohol/week: 5.0 - 6.0 standard drinks of alcohol    Types: 5 - 6 Cans of beer per week    Comment: 2-3 per day   Drug use: No       OPHTHALMIC EXAM:  Base Eye Exam     Visual Acuity (Snellen - Linear)       Right Left   Dist cc 20/40 20/60   Dist ph cc NI NI    Correction: Glasses         Tonometry (Tonopen, 2:20 PM)       Right Left   Pressure 15 15         Pupils       Dark Light Shape React APD   Right 3 2 Round Brisk None   Left 3 2 Round Brisk None         Visual Fields       Left Right    Full Full         Extraocular Movement       Right Left    Full, Ortho Full, Ortho         Neuro/Psych     Oriented x3: Yes   Mood/Affect: Normal         Dilation     Both eyes: 1.0% Mydriacyl, 2.5% Phenylephrine @ 2:13 PM           Slit Lamp and Fundus Exam     Slit Lamp Exam       Right Left   Lids/Lashes Mild Dermatochalasis - upper lid, mild MGD Mild Dermatochalasis - upper lid   Conjunctiva/Sclera White and quiet White and quiet   Cornea Arcus, trace Punctate epithelial erosions, well healed temporal cataract wounds Arcus, trace Punctate epithelial erosions, fine endopigment, well healed cataract wounds   Anterior Chamber deep and clear, narrow temporal angle, no cell or flare deep and clear,  narrow  temporal angle   Iris Round and dilated Round and dilated   Lens PC IOL in good position PC IOL in good position   Anterior Vitreous Posterior vitreous detachment, vitreous syneresis Vitreous syneresis         Fundus Exam       Right Left   Disc Tilted disc, +cupping, temporal PPA, Sharp rim, +disc heme at 0700--resolved, thin inferior rim Tilted disc, +cupping, temporal PPA, mild Pallor, Sharp rim   C/D Ratio 0.7 0.6   Macula Flat, Blunted foveal reflex, central Cystic changes--improved, Drusen, RPE mottling and clumping, no heme Flat, Blunted foveal reflex, central macular cyst -- improved, Drusen, RPE mottling and clumping, no heme, central vitelliform like lesion   Vessels attenuated, mild tortuousity, mild AV crossing changes attenuated, Tortuous   Periphery Attached, peripheral drusen, reticular degeneration, No heme Attached, peripheral drusen, reticular degeneration, No heme           Refraction     Wearing Rx       Sphere Cylinder Axis Add   Right -1.00 +1.25 022 +2.50   Left -1.50 +2.50 158 +2.50    Type: PAL         Manifest Refraction       Sphere Cylinder Axis Dist VA Add   Right -1.75 +1.25 022 20/30 +2.50   Left -1.75 +2.25 158 20/50 +2.50           IMAGING AND PROCEDURES  Imaging and Procedures for @TODAY @  OCT, Retina - OU - Both Eyes       Right Eye Quality was good. Central Foveal Thickness: 365. Progression has improved. Findings include no SRF, abnormal foveal contour, myopic contour, retinal drusen , intraretinal fluid, vitreous traction (VMT with mild interval improvement in cystic changes--VMT releasing).   Left Eye Quality was good. Central Foveal Thickness: 245. Progression has improved. Findings include normal foveal contour, no IRF, no SRF, retinal drusen , vitreous traction (stable release of central VMT with interval resolution of central cystic changes, interval improvement in foveal contour, stable vitelliform like lesion,  partial PVD).   Notes *Images captured and stored on drive  Diagnosis / Impression:  Non-exu ARMD OU VMT with central cystic changes OU OD: VMT with mild interval improvement in cystic changes--VMT releasing OS: stable release of central VMT with interval resolution of central cystic changes, interval improvement in foveal contour, stable vitelliform like lesion, partial PVD  Clinical management:  See below  Abbreviations: NFP - Normal foveal profile. CME - cystoid macular edema. PED - pigment epithelial detachment. IRF - intraretinal fluid. SRF - subretinal fluid. EZ - ellipsoid zone. ERM - epiretinal membrane. ORA - outer retinal atrophy. ORT - outer retinal tubulation. SRHM - subretinal hyper-reflective material             ASSESSMENT/PLAN:    ICD-10-CM   1. Vitreomacular adhesion of both eyes  H43.823 OCT, Retina - OU - Both Eyes    2. Intermediate stage nonexudative age-related macular degeneration of both eyes  H35.3132     3. Essential hypertension  I10     4. Hypertensive retinopathy of both eyes  H35.033     5. Pseudophakia, both eyes  Z96.1     6. Primary open angle glaucoma of both eyes, unspecified glaucoma stage  H40.1130     7. Dry eyes  H04.123      1. VMT with central cystic changes OU  - BCVA OD 20/40 from 20/30; OS 20/60 from 20/40  - asymptomatic,  no metamorphopsia  - OCT shows OD: VMT with mild interval improvement in cystic changes--VMT releasing; OS: stable release of central VMT with interval resolution of central cystic changes, interval improvement in foveal contour, stable vitelliform like lesion, partial PVD  - discussed possibility of spontaneous release of VMT vs progression to macular hole  - no indication for surgery at this time and pt wishes to monitor for now -- reasonable  - f/u 6 months, sooner prn - repeat DFE/OCT  2. Age related macular degeneration, non-exudative, both eyes  - stable intermediate stage disease  - The incidence,  anatomy, and pathology of dry AMD, risk of progression, and the AREDS and AREDS 2 study including smoking risks discussed with patient. Patient was instructed to continue taking AREDS despite her not liking to take pills.    - recommend Amsler grid monitoring  3,4. Hypertensive retinopathy OU  - discussed importance of tight BP control  - monitor  5. Pseudophakia OU  - s/p CE/IOL (OS: 09.01.21, OD: 09.15.21, Dr. Alben Spittle)  - IOLs in good position, doing well  - monitor  6. POAG OU  - under the expert management of Dr. Zenaida Niece  - IOP today: 15 OU  - on dorzolomide, brimonidine BID OU, Rhopressa every day OU, managed by Dr. Zenaida Niece.   7. Dry eyes OU  - recommend artificial tears and lubricating ointment as needed   Ophthalmic Meds Ordered this visit:  No orders of the defined types were placed in this encounter.    Return in about 6 months (around 03/18/2023) for VMT OU, DFE, OCT .  This document serves as a record of services personally performed by Karie Chimera, MD, PhD. It was created on their behalf by De Blanch, an ophthalmic technician. The creation of this record is the provider's dictation and/or activities during the visit.    Electronically signed by: De Blanch, OA, 09/15/22  5:17 PM   Karie Chimera, M.D., Ph.D. Diseases & Surgery of the Retina and Vitreous [facility]  I have reviewed the above documentation for accuracy and completeness, and I agree with the above. Karie Chimera, M.D., Ph.D. 09/15/22 5:18 PM   Abbreviations: M myopia (nearsighted); A astigmatism; H hyperopia (farsighted); P presbyopia; Mrx spectacle prescription;  CTL contact lenses; OD right eye; OS left eye; OU both eyes  XT exotropia; ET esotropia; PEK punctate epithelial keratitis; PEE punctate epithelial erosions; DES dry eye syndrome; MGD meibomian gland dysfunction; ATs artificial tears; PFAT's preservative free artificial tears; NSC nuclear sclerotic cataract;  PSC posterior subcapsular cataract; ERM epi-retinal membrane; PVD posterior vitreous detachment; RD retinal detachment; DM diabetes mellitus; DR diabetic retinopathy; NPDR non-proliferative diabetic retinopathy; PDR proliferative diabetic retinopathy; CSME clinically significant macular edema; DME diabetic macular edema; dbh dot blot hemorrhages; CWS cotton wool spot; POAG primary open angle glaucoma; C/D cup-to-disc ratio; HVF humphrey visual field; GVF goldmann visual field; OCT optical coherence tomography; IOP intraocular pressure; BRVO Branch retinal vein occlusion; CRVO central retinal vein occlusion; CRAO central retinal artery occlusion; BRAO branch retinal artery occlusion; RT retinal tear; SB scleral buckle; PPV pars plana vitrectomy; VH Vitreous hemorrhage; PRP panretinal laser photocoagulation; IVK intravitreal kenalog; VMT vitreomacular traction; MH Macular hole;  NVD neovascularization of the disc; NVE neovascularization elsewhere; AREDS age related eye disease study; ARMD age related macular degeneration; POAG primary open angle glaucoma; EBMD epithelial/anterior basement membrane dystrophy; ACIOL anterior chamber intraocular lens; IOL intraocular lens; PCIOL posterior chamber intraocular lens; Phaco/IOL phacoemulsification with intraocular lens placement;  PRK photorefractive keratectomy; LASIK laser assisted in situ keratomileusis; HTN hypertension; DM diabetes mellitus; COPD chronic obstructive pulmonary disease

## 2022-09-14 DIAGNOSIS — G243 Spasmodic torticollis: Secondary | ICD-10-CM | POA: Diagnosis not present

## 2022-09-14 DIAGNOSIS — M5481 Occipital neuralgia: Secondary | ICD-10-CM | POA: Diagnosis not present

## 2022-09-14 DIAGNOSIS — G5600 Carpal tunnel syndrome, unspecified upper limb: Secondary | ICD-10-CM | POA: Diagnosis not present

## 2022-09-15 ENCOUNTER — Encounter (INDEPENDENT_AMBULATORY_CARE_PROVIDER_SITE_OTHER): Payer: Self-pay | Admitting: Ophthalmology

## 2022-09-15 ENCOUNTER — Ambulatory Visit (INDEPENDENT_AMBULATORY_CARE_PROVIDER_SITE_OTHER): Payer: Medicare Other | Admitting: Ophthalmology

## 2022-09-15 DIAGNOSIS — H401130 Primary open-angle glaucoma, bilateral, stage unspecified: Secondary | ICD-10-CM

## 2022-09-15 DIAGNOSIS — H04123 Dry eye syndrome of bilateral lacrimal glands: Secondary | ICD-10-CM | POA: Diagnosis not present

## 2022-09-15 DIAGNOSIS — I10 Essential (primary) hypertension: Secondary | ICD-10-CM

## 2022-09-15 DIAGNOSIS — Z961 Presence of intraocular lens: Secondary | ICD-10-CM | POA: Diagnosis not present

## 2022-09-15 DIAGNOSIS — H43823 Vitreomacular adhesion, bilateral: Secondary | ICD-10-CM

## 2022-09-15 DIAGNOSIS — H353132 Nonexudative age-related macular degeneration, bilateral, intermediate dry stage: Secondary | ICD-10-CM | POA: Diagnosis not present

## 2022-09-15 DIAGNOSIS — H35033 Hypertensive retinopathy, bilateral: Secondary | ICD-10-CM

## 2022-09-17 ENCOUNTER — Telehealth: Payer: Self-pay | Admitting: Family

## 2022-09-17 NOTE — Telephone Encounter (Signed)
Spoke with [facility] and confirmed they did receive the fax.

## 2022-09-17 NOTE — Telephone Encounter (Signed)
[facility] states they are waiting on pcp to sign an order for the pt's mammogram. Confirmed fax #.

## 2022-09-17 NOTE — Telephone Encounter (Signed)
Form was faxed 09/16/2022.

## 2022-09-21 ENCOUNTER — Telehealth: Payer: Self-pay | Admitting: Adult Health

## 2022-09-21 DIAGNOSIS — H401122 Primary open-angle glaucoma, left eye, moderate stage: Secondary | ICD-10-CM | POA: Diagnosis not present

## 2022-09-21 DIAGNOSIS — H401111 Primary open-angle glaucoma, right eye, mild stage: Secondary | ICD-10-CM | POA: Diagnosis not present

## 2022-10-04 ENCOUNTER — Ambulatory Visit: Payer: Medicare Other | Admitting: Adult Health

## 2022-10-05 DIAGNOSIS — N644 Mastodynia: Secondary | ICD-10-CM | POA: Diagnosis not present

## 2022-10-05 DIAGNOSIS — R928 Other abnormal and inconclusive findings on diagnostic imaging of breast: Secondary | ICD-10-CM | POA: Diagnosis not present

## 2022-10-05 DIAGNOSIS — Z853 Personal history of malignant neoplasm of breast: Secondary | ICD-10-CM | POA: Diagnosis not present

## 2022-10-05 DIAGNOSIS — N6489 Other specified disorders of breast: Secondary | ICD-10-CM | POA: Diagnosis not present

## 2022-10-06 ENCOUNTER — Encounter: Payer: Self-pay | Admitting: Family

## 2022-10-13 ENCOUNTER — Other Ambulatory Visit: Payer: Self-pay

## 2022-10-13 DIAGNOSIS — N6031 Fibrosclerosis of right breast: Secondary | ICD-10-CM | POA: Diagnosis not present

## 2022-10-13 DIAGNOSIS — R928 Other abnormal and inconclusive findings on diagnostic imaging of breast: Secondary | ICD-10-CM | POA: Diagnosis not present

## 2022-10-13 DIAGNOSIS — N6011 Diffuse cystic mastopathy of right breast: Secondary | ICD-10-CM | POA: Diagnosis not present

## 2022-10-14 DIAGNOSIS — M1612 Unilateral primary osteoarthritis, left hip: Secondary | ICD-10-CM | POA: Diagnosis not present

## 2022-10-14 DIAGNOSIS — M25552 Pain in left hip: Secondary | ICD-10-CM | POA: Diagnosis not present

## 2022-10-18 ENCOUNTER — Encounter: Payer: Self-pay | Admitting: Radiology

## 2022-10-18 ENCOUNTER — Inpatient Hospital Stay: Payer: Medicare Other | Attending: Adult Health | Admitting: Adult Health

## 2022-10-18 ENCOUNTER — Encounter: Payer: Self-pay | Admitting: Adult Health

## 2022-10-18 VITALS — BP 131/66 | HR 86 | Temp 97.8°F | Resp 18 | Ht 61.0 in | Wt 155.3 lb

## 2022-10-18 DIAGNOSIS — Z818 Family history of other mental and behavioral disorders: Secondary | ICD-10-CM | POA: Diagnosis not present

## 2022-10-18 DIAGNOSIS — Z90710 Acquired absence of both cervix and uterus: Secondary | ICD-10-CM | POA: Diagnosis not present

## 2022-10-18 DIAGNOSIS — Z808 Family history of malignant neoplasm of other organs or systems: Secondary | ICD-10-CM | POA: Diagnosis not present

## 2022-10-18 DIAGNOSIS — D0512 Intraductal carcinoma in situ of left breast: Secondary | ICD-10-CM | POA: Diagnosis not present

## 2022-10-18 DIAGNOSIS — Z8719 Personal history of other diseases of the digestive system: Secondary | ICD-10-CM | POA: Diagnosis not present

## 2022-10-18 DIAGNOSIS — Z801 Family history of malignant neoplasm of trachea, bronchus and lung: Secondary | ICD-10-CM | POA: Diagnosis not present

## 2022-10-18 DIAGNOSIS — Z79899 Other long term (current) drug therapy: Secondary | ICD-10-CM | POA: Insufficient documentation

## 2022-10-18 DIAGNOSIS — Z87891 Personal history of nicotine dependence: Secondary | ICD-10-CM | POA: Insufficient documentation

## 2022-10-18 DIAGNOSIS — Z9049 Acquired absence of other specified parts of digestive tract: Secondary | ICD-10-CM | POA: Diagnosis not present

## 2022-10-18 DIAGNOSIS — K219 Gastro-esophageal reflux disease without esophagitis: Secondary | ICD-10-CM | POA: Insufficient documentation

## 2022-10-18 DIAGNOSIS — Z8249 Family history of ischemic heart disease and other diseases of the circulatory system: Secondary | ICD-10-CM | POA: Insufficient documentation

## 2022-10-18 DIAGNOSIS — Z8379 Family history of other diseases of the digestive system: Secondary | ICD-10-CM | POA: Insufficient documentation

## 2022-10-18 NOTE — Research (Signed)
MTG-015 - Tissue and Bodily Fluids: Translational Medicine: Discovery and Evaluation of Biomarkers/Pharmacogenomics for the Diagnosis and Personalized Management of Patients    10/18/2022  1 YR FOLLOW UP: Met with [name] unaccompanied during visit with Lillard Anes, APP. Patient stated overall she has been doing well and exercising and eating well. Patient recently had a mammogram and biopsy, which showed benign findings. Patient has no new medical conditions and no recurrence. The patient's med list was reviewed.  Patient is no longer taking Famotidine (has been greater than 30 days since stopping), Perfluorohexyloctane solution (has been greater than 30 days since stopping) Patient reports new medications: Netasurdil Dimesylate starting 08/27/2022 and Meloxicam 15 mg PRN starting 09/2022.   Patient was provided a $39 [employer] gift card as a thank you for her time and participation in the above mentioned study. Informed patient this is the only follow-up for the above mentioned study. Patient was thanked for her time and support of the above mentioned study.   Merri Brunette, RT(R)(T) Clinical Research Coordinator

## 2022-10-18 NOTE — Progress Notes (Signed)
[facility] Cancer Follow up:    Zykeria, Gamel, FNP [address] [place] [address]   DIAGNOSIS:  Cancer Staging  Ductal carcinoma in situ (DCIS) of left breast Staging form: Breast, AJCC 8th Edition - Clinical: Stage 0 (cTis (DCIS), cN0, cM0, G2, ER+, PR+, HER2: Not Assessed) - Signed by Serena Croissant, MD on 09/30/2021 Stage prefix: Initial diagnosis Histologic grading system: 3 grade system   SUMMARY OF ONCOLOGIC HISTORY: Oncology History  Ductal carcinoma in situ (DCIS) of left breast  09/22/2021 Initial Diagnosis   Screening mammogram detected left breast calcifications UOQ 1 cm, stereotactic biopsy revealed intermediate grade DCIS, ER/PR positive   09/30/2021 Cancer Staging   Staging form: Breast, AJCC 8th Edition - Clinical: Stage 0 (cTis (DCIS), cN0, cM0, G2, ER+, PR+, HER2: Not Assessed) - Signed by Serena Croissant, MD on 09/30/2021 Stage prefix: Initial diagnosis Histologic grading system: 3 grade system   10/28/2021 Surgery   Left lumpectomy: Area of fibrosis, fat necrosis and hemorrhage consistent with the biopsy changes, benign, no residual DCIS identified   10/2021 -  Anti-estrogen oral therapy   Tamoxifen 5 years--opted to forego Tamoxifen, declines other antiestrogen therapy at this time     CURRENT THERAPY: observation  INTERVAL HISTORY: Ruth Wolfe 77 y.o. female returns for follow-up of her history of ductal carcinoma in situ of the left breast.  She underwent bilateral breast diagnostic mammogram on October 05, 2022 that demonstrated a right breast architectural distortion for which biopsy was recommended.  Right breast needle core biopsy on October 13, 2022 demonstrated fibrocystic change with hyalin fibrosis, focal usual ductal hyperplasia and rare calcifications; negative for carcinoma.  Mccalvin got the results today and was very excited to not have to worry about having to undergo cancer diagnosis, surgery and other  treatments.   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 sulfonamide containing compounds and sulfa antibiotics.  MEDICAL HISTORY: Past Medical History:  Diagnosis Date   Allergy 2013   Arthritis 1980   Asthma 2000   Breast cancer (HCC) 09/22/2021   DCIS   Bruit    Per patient   Cataracts, both eyes 2021   surgery 2022   Colon polyps    Environmental allergies 2013   Fibromyalgia    GERD (gastroesophageal reflux disease) 2013   Glaucoma 2016   POAG OU   History of hiatal hernia    Hyperlipidemia 2014   Hypertensive retinopathy 2021   OU   Macular degeneration 2014   Neuropathy 2013   legs and arms   Osteopenia 2000   Osteoporosis    Paralyzed vocal cords 2013   Per patient   Tongue atrophy 2013   Per patient r/t paralyzed vocal cords   Vitamin B12 deficiency  2021   Vitamin D deficiency 2021    SURGICAL HISTORY: Past Surgical History:  Procedure Laterality Date   APPENDECTOMY  1981   with hysterectomy   BREAST LUMPECTOMY WITH RADIOACTIVE SEED LOCALIZATION Left 10/28/2021   Procedure: LEFT BREAST SEED LUMPECTOMY;  Surgeon: Harriette Bouillon, MD;  Location: [facility] OR;  Service: General;  Laterality: Left;   CATARACT EXTRACTION Bilateral     CERVICAL SPINE SURGERY  01/30/2020   Per patient   TOTAL ABDOMINAL HYSTERECTOMY  1981    SOCIAL HISTORY: Social History   Socioeconomic History   Marital status: Divorced    Spouse name: Not on file   Number of children: 0   Years of education: Not on file   Highest education level: Not on file  Occupational History   Occupation: retired  Tobacco Use   Smoking status: Former    Types: Cigarettes   Smokeless tobacco: Never   Tobacco comments:    College years  Vaping Use   Vaping status: Never Used  Substance and Sexual Activity   Alcohol use: Yes    Alcohol/week: 5.0 - 6.0 standard drinks of alcohol    Types: 5 - 6 Cans of beer per week    Comment: 2-3 per day   Drug use: No   Sexual activity: Not on file  Other Topics Concern   Not on file  Social History Narrative   Not on file   Social Determinants of Health   Financial Resource Strain: Low Risk  (07/02/2021)   Overall Financial Resource Strain (CARDIA)    Difficulty of Paying Living Expenses: Not hard at all  Food Insecurity: No Food Insecurity (08/04/2022)   Hunger Vital Sign    Worried About Running Out of Food in the Last Year: Never true    Ran Out of Food in the Last Year: Never true  Transportation Needs: No Transportation Needs (08/04/2022)   PRAPARE - Administrator, Civil Service (Medical): No    Lack of Transportation (Non-Medical): No  Physical Activity: Sufficiently Active (07/02/2021)   Exercise Vital Sign    Days of Exercise per Week: 3 days    Minutes of Exercise per Session: 50 min  Stress: No Stress Concern Present (07/02/2021)   [employer] of Occupational Health - Occupational Stress Questionnaire    Feeling of Stress : Not at all  Social Connections: Moderately Integrated (08/04/2022)   Social Connection and Isolation Panel [NHANES]    Frequency of Communication with Friends and Family: More than three times a week    Frequency of Social Gatherings with Friends and Family: More  than three times a week    Attends Religious Services: More than 4 times per year    Active Member of [employer] or Organizations: Yes    Attends Engineer, structural: More than 4 times per year    Marital Status: Divorced  Intimate Partner Violence: Not At Risk (08/04/2022)   Humiliation, Afraid, Rape, and Kick questionnaire    Fear of Current or Ex-Partner: No    Emotionally Abused: No    Physically Abused: No    Sexually Abused: No    FAMILY HISTORY: Family History  Problem Relation Age of Onset   Hypertension Mother    Lung cancer Father        he smoked   Glaucoma Father    Gout Father    Alzheimer's disease Father    Hypertension Sister    Liver disease Sister    Brain  cancer Cousin        glioblastoma, paternal first cousin   Colon cancer Neg Hx    Stomach cancer Neg Hx     Review of Systems  Constitutional:  Negative for appetite change, chills, fatigue, fever and unexpected weight change.  HENT:   Negative for hearing loss, lump/mass and trouble swallowing.   Eyes:  Negative for eye problems and icterus.  Respiratory:  Negative for chest tightness, cough and shortness of breath.   Cardiovascular:  Negative for chest pain, leg swelling and palpitations.  Gastrointestinal:  Negative for abdominal distention, abdominal pain, constipation, diarrhea, nausea and vomiting.  Endocrine: Negative for hot flashes.  Genitourinary:  Negative for difficulty urinating.   Musculoskeletal:  Negative for arthralgias.  Skin:  Negative for itching and rash.  Neurological:  Negative for dizziness, extremity weakness, headaches and numbness.  Hematological:  Negative for adenopathy. Does not bruise/bleed easily.  Psychiatric/Behavioral:  Negative for depression. The patient is not nervous/anxious.       PHYSICAL EXAMINATION   Onc Performance Status - 10/18/22 1054       ECOG Perf Status   ECOG Perf Status Fully active, able to carry on all pre-disease performance without  restriction      KPS SCALE   KPS % SCORE Normal, no compliants, no evidence of disease             Vitals:   10/18/22 1040  BP: 131/66  Pulse: 86  Resp: 18  Temp: 97.8 F (36.6 C)  SpO2: 100%    Physical Exam Constitutional:      General: She is not in acute distress.    Appearance: Normal appearance. She is not toxic-appearing.  HENT:     Head: Normocephalic and atraumatic.     Mouth/Throat:     Mouth: Mucous membranes are moist.     Pharynx: Oropharynx is clear. No oropharyngeal exudate or posterior oropharyngeal erythema.  Eyes:     General: No scleral icterus. Cardiovascular:     Rate and Rhythm: Normal rate and regular rhythm.     Pulses: Normal pulses.     Heart sounds: Normal heart sounds.  Pulmonary:     Effort: Pulmonary effort is normal.     Breath sounds: Normal breath sounds.  Chest:     Comments: Left breast status postlumpectomy, no sign of local recurrence right breast status postbiopsy there is a small amount of ecchymosis around it however no swelling or sign of infection is present.  On the areas where she had tape there is some erythema. Abdominal:     General: Abdomen is flat. Bowel sounds are normal. There is no distension.     Palpations: Abdomen is soft.     Tenderness: There is no abdominal tenderness.  Musculoskeletal:        General: No swelling.     Cervical back: Neck supple.  Lymphadenopathy:     Cervical: No cervical adenopathy.  Skin:    General: Skin is warm and dry.     Findings: No rash.  Neurological:     General: No focal deficit present.     Mental Status: She is alert.  Psychiatric:        Mood and Affect: Mood normal.        Behavior: Behavior normal.     ASSESSMENT and THERAPY PLAN:   Ductal carcinoma in situ (DCIS) of left breast Randee is a 77 year old woman with history of left breast stage 0 ductal carcinoma in situ  status postlumpectomy alone in September 2023.  Leta Jungling has no clinical or radiographic sign  of breast cancer recurrence.  She will continue on observation alone.  We reviewed in detail healthy diet, exercise, and avoiding risky substances.  I gave her a handout of the [employer] of lifestyle medicines food is medicine jumpstart guide.  She will return in 1 year for continued long-term follow-up and knows to call for any questions or concerns that may arise between now and her next visit.     All questions were answered. The patient knows to call the clinic with any problems, questions or concerns. We can certainly see the patient much sooner if necessary.  Total encounter time:30 minutes*in face-to-face visit time, chart review, lab review, care coordination, order entry, and documentation of the encounter time.    Lillard Anes, NP 10/18/22 11:31 AM Medical Oncology and Hematology [facility] [address] [place], [place] [address] Tel. [phone]    Fax. [phone]  *Total Encounter Time as defined by the Centers for Medicare and Medicaid Services includes, in addition to the face-to-face time of a patient visit (documented in the note above) non-face-to-face time: obtaining and reviewing outside history, ordering and reviewing medications, tests or procedures, care coordination (communications with other health care professionals or caregivers) and documentation in the medical record.

## 2022-10-18 NOTE — Assessment & Plan Note (Signed)
Ruth Wolfe is a 77 year old woman with history of left breast stage 0 ductal carcinoma in situ status postlumpectomy alone in September 2023.  Ruth Wolfe has no clinical or radiographic sign of breast cancer recurrence.  She will continue on observation alone.  We reviewed in detail healthy diet, exercise, and avoiding risky substances.  I gave her a handout of the [employer] of lifestyle medicines food is medicine jumpstart guide.  She will return in 1 year for continued long-term follow-up and knows to call for any questions or concerns that may arise between now and her next visit.

## 2022-10-19 DIAGNOSIS — M5481 Occipital neuralgia: Secondary | ICD-10-CM | POA: Diagnosis not present

## 2022-11-15 DIAGNOSIS — Z23 Encounter for immunization: Secondary | ICD-10-CM | POA: Diagnosis not present

## 2022-11-19 ENCOUNTER — Other Ambulatory Visit (HOSPITAL_COMMUNITY): Payer: Self-pay | Admitting: *Deleted

## 2022-11-19 ENCOUNTER — Encounter: Payer: Self-pay | Admitting: Gastroenterology

## 2022-11-19 ENCOUNTER — Ambulatory Visit (INDEPENDENT_AMBULATORY_CARE_PROVIDER_SITE_OTHER): Payer: Medicare Other | Admitting: Gastroenterology

## 2022-11-19 VITALS — BP 132/80 | HR 97 | Ht 61.0 in | Wt 156.0 lb

## 2022-11-19 DIAGNOSIS — R059 Cough, unspecified: Secondary | ICD-10-CM

## 2022-11-19 DIAGNOSIS — R1310 Dysphagia, unspecified: Secondary | ICD-10-CM

## 2022-11-19 NOTE — Patient Instructions (Signed)
You have been scheduled for a modified barium swallow on 11/30/22 at 11:30am. Please arrive 30 minutes prior to your test for registration. You will go to [facility] Radiology (1st Floor) for your appointment. Should you need to cancel or reschedule your appointment, please contact [phone] Pat[facility] D[facility] [place]) or [phone] [facility]). _____________________________________________________________________ A Modified Barium Swallow Study, or MBS, is a special x-ray that is taken to check swallowing skills. It is carried out by a Marine scientist and a Warehouse manager (SLP). During this test, yourmouth, throat, and esophagus, a muscular tube which connects your mouth to your stomach, is checked. The test will help you, your doctor, and the SLP plan what types of foods and liquids are easier for you to swallow. The SLP will also identify positions and ways to help you swallow more easily and safely. What will happen during an MBS? You will be taken to an x-ray room and seated comfortably. You will be asked to swallow small amounts of food and liquid mixed with barium. Barium is a liquid or paste that allows images of your mouth, throat and esophagus to be seen on x-ray. The x-ray captures moving images of the food you are swallowing as it travels from your mouth through your throat and into your esophagus. This test helps identify whether food or liquid is entering your lungs (aspiration). The test also shows which part of your mouth or throat lacks strength or coordination to move the food or liquid in the right direction. This test typically takes 30 minutes to 1 hour to complete. _______________________________________________________________________   Bonita Quin have been scheduled for an endoscopy. Please follow written instructions given to you at your visit today.  If you use inhalers (even only as needed), please bring them with you on the day of your procedure.  If you take any of the following  medications, they will need to be adjusted prior to your procedure:   DO NOT TAKE 7 DAYS PRIOR TO TEST- Trulicity (dulaglutide) Ozempic, Wegovy (semaglutide) Mounjaro (tirzepatide) Bydureon Bcise (exanatide extended release)  DO NOT TAKE 1 DAY PRIOR TO YOUR TEST Rybelsus (semaglutide) Adlyxin (lixisenatide) Victoza (liraglutide) Byetta (exanatide) ___________________________________________________________________________    The [place] [facility] providers would like to encourage you to use [facility] to communicate with providers for non-urgent requests or questions.  Due to long hold times on the telephone, sending your provider a message by [facility] may be a faster and more efficient way to get a response.  Please allow 48 business hours for a response.  Please remember that this is for non-urgent requests.   Due to recent changes in healthcare laws, you may see the results of your imaging and laboratory studies on MyChart before your provider has had a chance to review them.  We understand that in some cases there may be results that are confusing or concerning to you. Not all laboratory results come back in the same time frame and the provider may be waiting for multiple results in order to interpret others.  Please give [place] 48 hours in order for your provider to thoroughly review all the results before contacting the office for clarification of your results.   Thank you for choosing me and [facility].  Venita Lick. Pleas Koch., MD., Clementeen Graham

## 2022-11-19 NOTE — Progress Notes (Addendum)
    Assessment     Dysphagia, suspected oropharyngeal.    Recommendations    Schedule MBSS Schedule EGD. The risks (including bleeding, perforation, infection, missed lesions, medication reactions and possible hospitalization or surgery if complications occur), benefits, and alternatives to endoscopy with possible biopsy and possible dilation were discussed with the patient and they consent to proceed.     HPI    This is a 77 year old female solid food and pill dysphagia for many years.  Her symptoms have not changed over time.  S/P cervical neck surgery in 2022 however symptoms did not clearly worsen after the surgery. Dysphonia and vocal cord paralysis for years without a clear etiology noted.  The time course of dysphagia and dysphonia are similar however the patient is unable to provide specific timeline.  Barium esophagram in March 2024 showed mild esophageal dysmotility, no evidence of an esophageal stricture or GERD.  No prior EGD.  She was recommended to undergo EGD in July 2024 but declined.  See GI office notes from February 2024 and July 2024.   Labs / Imaging       Latest Ref Rng & Units 08/20/2022   11:51 AM 04/20/2022   11:33 AM 03/23/2022   11:07 AM  Hepatic Function  Total Protein 6.0 - 8.3 g/dL 6.7  7.3  6.9   Albumin 3.5 - 5.2 g/dL 4.0  4.2  4.2   AST 0 - 37 U/L 17  20  19    ALT 0 - 35 U/L 16  21  17    Alk Phosphatase 39 - 117 U/L 66  61  50   Total Bilirubin 0.2 - 1.2 mg/dL 1.2  0.9  0.8        Latest Ref Rng & Units 08/20/2022   11:51 AM 04/20/2022   11:33 AM 03/23/2022   11:07 AM  CBC  WBC 4.0 - 10.5 K/uL 7.0  9.4  7.6   Hemoglobin 12.0 - 15.0 g/dL 91.4  78.2  95.6   Hematocrit 36.0 - 46.0 % 40.1  42.6  40.7   Platelets 150.0 - 400.0 K/uL 263.0  260.0  251.0     Current Medications, Allergies, Past Medical History, Past Surgical History, Family History and Social History were reviewed in [employer] record.   Physical  Exam: General: Well developed, well nourished, no acute distress Head: Normocephalic and atraumatic Eyes: Sclerae anicteric, EOMI Ears: Normal auditory acuity Mouth: No deformities or lesions noted Lungs: Clear throughout to auscultation Heart: Regular rate and rhythm; No murmurs, rubs or bruits Abdomen: Soft, non tender and non distended. No masses, hepatosplenomegaly or hernias noted. Normal Bowel sounds Rectal: Not done Musculoskeletal: Symmetrical with no gross deformities  Pulses:  Normal pulses noted Extremities: No edema or deformities noted Neurological: Alert oriented x 4, grossly nonfocal Psychological:  Alert and cooperative. Normal mood and affect   Anjolina Byrer T. Russella Dar, MD 11/19/2022, 9:37 AM

## 2022-11-24 DIAGNOSIS — M1612 Unilateral primary osteoarthritis, left hip: Secondary | ICD-10-CM | POA: Diagnosis not present

## 2022-11-30 ENCOUNTER — Encounter: Payer: Self-pay | Admitting: Gastroenterology

## 2022-11-30 ENCOUNTER — Ambulatory Visit (HOSPITAL_COMMUNITY)
Admission: RE | Admit: 2022-11-30 | Discharge: 2022-11-30 | Disposition: A | Discharge status: Home / Self Care | Payer: Medicare Other | Source: Ambulatory Visit | Attending: Gastroenterology | Admitting: Gastroenterology

## 2022-11-30 DIAGNOSIS — R1310 Dysphagia, unspecified: Secondary | ICD-10-CM

## 2022-11-30 DIAGNOSIS — R1312 Dysphagia, oropharyngeal phase: Secondary | ICD-10-CM | POA: Diagnosis not present

## 2022-11-30 DIAGNOSIS — R059 Cough, unspecified: Secondary | ICD-10-CM

## 2022-11-30 NOTE — Progress Notes (Signed)
Modified Barium Swallow Study  Patient Details  Name: Ruth Wolfe MRN: [id number] Date of Birth: November 04, 1945  Today's Date: 11/30/2022  Modified Barium Swallow completed.  Full report located under Chart Review in the Imaging Section.  History of Present Illness 77 year old female solid food and pill dysphagia for many years. Her symptoms have not changed over time. S/P cervical neck surgery in 2022 however symptoms did not clearly worsen after the surgery. Dysphonia and vocal cord paralysis for years without a clear etiology noted. Saw ENT and ST in 2014 to address. Pt reports painful globus sensation in area of chest with pills and meats. Has to clear it via forced vomitting. Pt plans to have EGD completed with GI.   Clinical Impression Ruth Wolfe presents with deviations in typical swallow physiology, though overall swallow function and safety are judged to be within gross functional limits. Anatomy notable for presence of cervical hardware which does not appear to impede bolus flow or epiglottic inversion. Oral phase within gross normal limits, though pt observed to frequently tilt head back prior to swallow initiation. SLP questions if pt is experiencing challenges in initiating swallow, pt attributes behavior d/t dislike of barium. Primary abnormality of pharyngeal phase of swallow related to timing. Bolus passes through pharynx and into upper esophagus prior to swallow initiation and thus there is a delay in achieving full airway protection (laryngeal vestibule closure, epiglottic inversion). Pt's epiglottis is noted to curve toward anterior pharyngeal wall which prohibits pooling of bolus into vallecula, which may be contributing factor to full epiglottic inversion being achieved after bolus has passed to pyriform sinuses. Despite deviation in swallow timing, only transient penetration seen with thin and mildly thick liquids. With pill administration, pt c/o sensation of feeling pill travel  through upper esophagus. Esophageal sweep completed reveals pill has cleared pharynx and esophagus when sensation occurring. Pt reports sensation c/w complaints with pills and meats. SLP provided education on strategies to aid in mitigating sensation: liquid wash, use of gravy or sauce to moisten challenging meats. Recommend pt continue with oral diet with regular solids and thin liquids. Encouraged pt to continue with work-up via GI to address symptoms. Additionally, pt may benefit from f/u with ENT to address presenting dysphonia, habitual throat clearing, and prior hx of VF paralysis. Based on results from ENT, may benefit from f/u with OP SLP to address the same.    Factors that may increase risk of adverse event in presence of aspiration Ruth Wolfe & Ruth Wolfe 2021):    Swallow Evaluation Recommendations Recommendations: PO diet PO Diet Recommendation: Regular;Thin liquids (Level 0) Liquid Administration via: Straw;Cup Medication Administration: Whole meds with liquid Supervision: Patient able to self-feed Swallowing strategies  : Follow solids with liquids (as needed for reported sensation of food stuck) Postural changes: Position pt fully upright for meals Oral care recommendations: Oral care BID (2x/day) Recommended consults: Consider ENT consultation;Consider esophageal assessment      Ruth Wolfe 11/30/2022,3:28 PM

## 2022-12-08 DIAGNOSIS — M25552 Pain in left hip: Secondary | ICD-10-CM | POA: Diagnosis not present

## 2022-12-10 ENCOUNTER — Encounter: Payer: Self-pay | Admitting: Gastroenterology

## 2022-12-10 ENCOUNTER — Ambulatory Visit: Payer: Medicare Other | Admitting: Gastroenterology

## 2022-12-10 VITALS — BP 124/79 | HR 77 | Temp 98.0°F | Resp 16 | Ht 61.0 in | Wt 156.0 lb

## 2022-12-10 DIAGNOSIS — R1319 Other dysphagia: Secondary | ICD-10-CM

## 2022-12-10 DIAGNOSIS — R1314 Dysphagia, pharyngoesophageal phase: Secondary | ICD-10-CM | POA: Diagnosis not present

## 2022-12-10 DIAGNOSIS — R1310 Dysphagia, unspecified: Secondary | ICD-10-CM | POA: Diagnosis not present

## 2022-12-10 DIAGNOSIS — K222 Esophageal obstruction: Secondary | ICD-10-CM

## 2022-12-10 HISTORY — PX: UPPER GASTROINTESTINAL ENDOSCOPY: SHX188

## 2022-12-10 MED ORDER — OMEPRAZOLE 20 MG PO CPDR
20.0000 mg | DELAYED_RELEASE_CAPSULE | Freq: Every day | ORAL | 11 refills | Status: DC
Start: 2022-12-10 — End: 2023-03-08

## 2022-12-10 MED ORDER — SODIUM CHLORIDE 0.9 % IV SOLN: 500.0000 mL | Freq: Once | INTRAVENOUS | Status: DC

## 2022-12-10 NOTE — Op Note (Signed)
[facility] Patient Name: Ruth Wolfe Procedure Date: 12/10/2022 9:49 AM MRN: [id number] Endoscopist: Meryl Dare , MD, [phone] Age: 77 Referring MD:  Date of Birth: November 25, 1945 Gender: Female Account #: [id number] Procedure:                Upper GI endoscopy Indications:              Dysphagia Medicines:                Monitored Anesthesia Care Procedure:                Pre-Anesthesia Assessment:                           - Prior to the procedure, a History and Physical                            was performed, and patient medications and                            allergies were reviewed. The patient's tolerance of                            previous anesthesia was also reviewed. The risks                            and benefits of the procedure and the sedation                            options and risks were discussed with the patient.                            All questions were answered, and informed consent                            was obtained. Prior Anticoagulants: The patient has                            taken no anticoagulant or antiplatelet agents. ASA                            Grade Assessment: II - A patient with mild systemic                            disease. After reviewing the risks and benefits,                            the patient was deemed in satisfactory condition to                            undergo the procedure.                           After obtaining informed consent, the endoscope was  passed under direct vision. Throughout the                            procedure, the patient's blood pressure, pulse, and                            oxygen saturations were monitored continuously. The                            Olympus Scope [id number] was introduced through the                            mouth, and advanced to the second part of duodenum.                            The upper GI endoscopy was  accomplished without                            difficulty. The patient tolerated the procedure                            well. Scope In: Scope Out: Findings:                 One benign-appearing, intrinsic moderate stenosis                            was found at the gastroesophageal junction. This                            stenosis measured 1.2 cm (inner diameter) x less                            than one cm (in length). The stenosis was                            traversed. A guidewire was placed and the scope was                            withdrawn. Dilations were performed with Savary                            dilators with mild resistance at 14 mm, 15 mm and                            16 mm.                           The exam of the esophagus was otherwise normal.                           A small hiatal hernia was present.                           Patchy mildly erythematous mucosa  without bleeding                            was found in the gastric body and in the gastric                            antrum. Biopsies were taken with a cold forceps for                            histology.                           The exam of the stomach was otherwise normal.                           The duodenal bulb and second portion of the                            duodenum were normal. Complications:            No immediate complications. Estimated Blood Loss:     Estimated blood loss was minimal. Impression:               - Benign-appearing esophageal stenosis. Dilated.                           - Small hiatal hernia.                           - Erythematous mucosa in the gastric body and                            antrum. Biopsied.                           - Normal duodenal bulb and second portion of the                            duodenum. Recommendation:           - Patient has a contact number available for                            emergencies. The signs and symptoms of  potential                            delayed complications were discussed with the                            patient. Return to normal activities tomorrow.                            Written discharge instructions were provided to the                            patient.                           -  Clear liquid diet for 2 hours, then advance as                            tolerated to soft diet today.                           - Resume prior diet tomorrow.                           - Follow antireflux measures.                           - Continue present medications.                           - Omeprazole 20 mg po qd, 1 year of refills.                           - Await pathology results.                           - Return to GI office in 6 weeks. Meryl Dare, MD 12/10/2022 10:33:25 AM This report has been signed electronically.

## 2022-12-10 NOTE — Progress Notes (Signed)
Vss nad trans to pacu 

## 2022-12-10 NOTE — Progress Notes (Signed)
See 11/19/2022 H&P no changes

## 2022-12-10 NOTE — Patient Instructions (Addendum)
-  Handout on hiatal hernia, antireflux, and post dilation diet provided -await pathology results --Continue present medications. Start Omeprazole 20 mg daily.   YOU HAD AN ENDOSCOPIC PROCEDURE TODAY AT THE [facility]:   Refer to the procedure report that was given to you for any specific questions about what was found during the examination.  If the procedure report does not answer your questions, please call your gastroenterologist to clarify.  If you requested that your care partner not be given the details of your procedure findings, then the procedure report has been included in a sealed envelope for you to review at your convenience later.  YOU SHOULD EXPECT: Some feelings of bloating in the abdomen. Passage of more gas than usual.  Walking can help get rid of the air that was put into your GI tract during the procedure and reduce the bloating. If you had a lower endoscopy (such as a colonoscopy or flexible sigmoidoscopy) you may notice spotting of blood in your stool or on the toilet paper. If you underwent a bowel prep for your procedure, you may not have a normal bowel movement for a few days.  Please Note:  You might notice some irritation and congestion in your nose or some drainage.  This is from the oxygen used during your procedure.  There is no need for concern and it should clear up in a day or so.  SYMPTOMS TO REPORT IMMEDIATELY:  Following upper endoscopy (EGD)  Vomiting of blood or coffee ground material  New chest pain or pain under the shoulder blades  Painful or persistently difficult swallowing  New shortness of breath  Fever of 100F or higher  Black, tarry-looking stools  For urgent or emergent issues, a gastroenterologist can be reached at any hour by calling [phone]. Do not use MyChart messaging for urgent concerns.    DIET:  Clear liquid diet for two hour, until 12:30 pm. Soft diet starting at 12:30pm  until tomorrow. See post dilation diet  handout for recommendations. Drink plenty of fluids but you should avoid alcoholic beverages for 24 hours   ACTIVITY:  You should plan to take it easy for the rest of today and you should NOT DRIVE or use heavy machinery until tomorrow (because of the sedation medicines used during the test).    FOLLOW UP: Our staff will call the number listed on your records the next business day following your procedure.  We will call around 7:15- 8:00 am to check on you and address any questions or concerns that you may have regarding the information given to you following your procedure. If we do not reach you, we will leave a message.     If any biopsies were taken you will be contacted by phone or by letter within the next 1-3 weeks.  Please call us at [phone] if you have not heard about the biopsies in 3 weeks.    SIGNATURES/CONFIDENTIALITY: You and/or your care partner have signed paperwork which will be entered into your electronic medical record.  These signatures attest to the fact that that the information above on your After Visit Summary has been reviewed and is understood.  Full responsibility of the confidentiality of this discharge information lies with you and/or your care-partner.

## 2022-12-10 NOTE — Progress Notes (Signed)
Called to room to assist during endoscopic procedure.  Patient ID and intended procedure confirmed with present staff. Received instructions for my participation in the procedure from the performing physician.  

## 2022-12-10 NOTE — Progress Notes (Signed)
Pt's states no medical or surgical changes since previsit or office visit. 

## 2022-12-13 ENCOUNTER — Telehealth: Payer: Self-pay | Admitting: *Deleted

## 2022-12-13 NOTE — Telephone Encounter (Signed)
Post procedure follow up call placed, no answer and left VM.

## 2022-12-14 LAB — SURGICAL PATHOLOGY

## 2022-12-16 ENCOUNTER — Encounter: Payer: Self-pay | Admitting: Gastroenterology

## 2023-01-04 DIAGNOSIS — M545 Low back pain, unspecified: Secondary | ICD-10-CM | POA: Diagnosis not present

## 2023-01-04 DIAGNOSIS — M7062 Trochanteric bursitis, left hip: Secondary | ICD-10-CM | POA: Diagnosis not present

## 2023-01-04 DIAGNOSIS — M47816 Spondylosis without myelopathy or radiculopathy, lumbar region: Secondary | ICD-10-CM | POA: Diagnosis not present

## 2023-01-04 DIAGNOSIS — S33130A Subluxation of L3/L4 lumbar vertebra, initial encounter: Secondary | ICD-10-CM | POA: Diagnosis not present

## 2023-01-04 DIAGNOSIS — M7918 Myalgia, other site: Secondary | ICD-10-CM | POA: Diagnosis not present

## 2023-01-04 DIAGNOSIS — M25552 Pain in left hip: Secondary | ICD-10-CM | POA: Diagnosis not present

## 2023-01-04 DIAGNOSIS — S33140A Subluxation of L4/L5 lumbar vertebra, initial encounter: Secondary | ICD-10-CM | POA: Diagnosis not present

## 2023-01-04 DIAGNOSIS — M25551 Pain in right hip: Secondary | ICD-10-CM | POA: Diagnosis not present

## 2023-01-04 DIAGNOSIS — M160 Bilateral primary osteoarthritis of hip: Secondary | ICD-10-CM | POA: Diagnosis not present

## 2023-01-18 DIAGNOSIS — M1612 Unilateral primary osteoarthritis, left hip: Secondary | ICD-10-CM | POA: Diagnosis not present

## 2023-01-18 DIAGNOSIS — M5137 Other intervertebral disc degeneration, lumbosacral region with discogenic back pain only: Secondary | ICD-10-CM | POA: Diagnosis not present

## 2023-02-03 DIAGNOSIS — H10021 Other mucopurulent conjunctivitis, right eye: Secondary | ICD-10-CM | POA: Diagnosis not present

## 2023-02-07 DIAGNOSIS — M1612 Unilateral primary osteoarthritis, left hip: Secondary | ICD-10-CM | POA: Diagnosis not present

## 2023-02-10 DIAGNOSIS — H10021 Other mucopurulent conjunctivitis, right eye: Secondary | ICD-10-CM | POA: Diagnosis not present

## 2023-02-14 DIAGNOSIS — M25552 Pain in left hip: Secondary | ICD-10-CM | POA: Diagnosis not present

## 2023-02-14 DIAGNOSIS — M47812 Spondylosis without myelopathy or radiculopathy, cervical region: Secondary | ICD-10-CM | POA: Diagnosis not present

## 2023-02-14 DIAGNOSIS — M5412 Radiculopathy, cervical region: Secondary | ICD-10-CM | POA: Diagnosis not present

## 2023-02-14 DIAGNOSIS — M5481 Occipital neuralgia: Secondary | ICD-10-CM | POA: Diagnosis not present

## 2023-02-15 ENCOUNTER — Telehealth: Payer: Self-pay | Admitting: Family

## 2023-02-15 NOTE — Telephone Encounter (Signed)
She will need OV for surgical clearance- this is not emergent and can be done in January; we just have to see her in person to complete paperwork.

## 2023-02-16 NOTE — Telephone Encounter (Signed)
 Patient scheduled for 03/08/23.

## 2023-02-18 DIAGNOSIS — H10023 Other mucopurulent conjunctivitis, bilateral: Secondary | ICD-10-CM | POA: Diagnosis not present

## 2023-02-24 ENCOUNTER — Ambulatory Visit: Payer: Medicare Other | Admitting: Gastroenterology

## 2023-02-24 ENCOUNTER — Encounter: Payer: Self-pay | Admitting: Gastroenterology

## 2023-02-24 VITALS — BP 124/70 | HR 88 | Ht 61.0 in

## 2023-02-24 DIAGNOSIS — K219 Gastro-esophageal reflux disease without esophagitis: Secondary | ICD-10-CM

## 2023-02-24 DIAGNOSIS — K222 Esophageal obstruction: Secondary | ICD-10-CM | POA: Diagnosis not present

## 2023-02-24 DIAGNOSIS — M1612 Unilateral primary osteoarthritis, left hip: Secondary | ICD-10-CM | POA: Diagnosis not present

## 2023-02-24 DIAGNOSIS — R1313 Dysphagia, pharyngeal phase: Secondary | ICD-10-CM

## 2023-02-24 MED ORDER — FAMOTIDINE 40 MG PO TABS: 40.0000 mg | ORAL_TABLET | Freq: Every day | ORAL | 11 refills | Status: DC

## 2023-02-24 MED ORDER — TRAMADOL HCL 50 MG PO TABS: 50.0000 mg | ORAL_TABLET | Freq: Three times a day (TID) | ORAL | 0 refills | Status: AC

## 2023-02-24 NOTE — Progress Notes (Signed)
Assessment     Esophageal dysphagia resolved post esophageal stricture dilation Pharyngeal dysphagia on MBSS GERD Severe L hip pain   Recommendations    Pt does not want a PPI. Famotidine 40 mg every day. Follow antireflux measures.  Tramadol 50 mg po tid prn pain, #30, no refills.  Patient is advised and understands that we will not provide any additional pain medication refills. Further mgmt per PCP and/or Ortho Famotidine refills per PCP and GI follow prn or REV with Dr. Doy Hutching in 1 year   HPI    This is a 77 year old female with solid food dysphagia that resolved after esophageal dilation.  She has mild pharyngeal dysphagia per MBSS and notes intermittent difficulty swallowing large pills.  She did not want to take omeprazole or another PPI due to the potential side effect profile.  She has severe left hip pain with a hip replacement planned in March.  She is unable to sleep or position herself at night. She is unable to sit comfortably in the exam room so she is standing but also in pain.  She states she is miserable and is unable to get adequate pain relief.  She has taken meloxicam and tizanidine without improvement in pain.  Tizanidine has helped with leg muscle spasms.  EGD Oct 2024 - Benign-appearing esophageal stenosis. Dilated.  - Small hiatal hernia.  - Erythematous mucosa in the gastric body and antrum. Biopsied.  - Normal duodenal bulb and second portion of the duodenum.   Labs / Imaging       Latest Ref Rng & Units 08/20/2022   11:51 AM 04/20/2022   11:33 AM 03/23/2022   11:07 AM  Hepatic Function  Total Protein 6.0 - 8.3 g/dL 6.7  7.3  6.9   Albumin 3.5 - 5.2 g/dL 4.0  4.2  4.2   AST 0 - 37 U/L 17  20  19    ALT 0 - 35 U/L 16  21  17    Alk Phosphatase 39 - 117 U/L 66  61  50   Total Bilirubin 0.2 - 1.2 mg/dL 1.2  0.9  0.8        Latest Ref Rng & Units 08/20/2022   11:51 AM 04/20/2022   11:33 AM 03/23/2022   11:07 AM  CBC  WBC 4.0 - 10.5 K/uL 7.0  9.4   7.6   Hemoglobin 12.0 - 15.0 g/dL 95.2  84.1  32.4   Hematocrit 36.0 - 46.0 % 40.1  42.6  40.7   Platelets 150.0 - 400.0 K/uL 263.0  260.0  251.0      DG SWALLOW FUNC OP MEDICARE SPEECH PATH Table formatting from the original result was not included. Modified Barium Swallow Study  Patient Details  Name: JIMESHA SCHUT MRN: [id number] Date of Birth: 10-Aug-1945  Today's Date: 11/30/2022  HPI/PMH: HPI: 77 year old female solid food and pill dysphagia for many years. Her  symptoms have not changed over time. S/P cervical neck surgery in 2022  however symptoms did not clearly worsen after the surgery. Dysphonia and  vocal cord paralysis for years without a clear etiology noted. Saw ENT and  ST in 2014 to address. Pt reports painful globus sensation in area of  chest with pills and meats. Has to clear it via forced vomitting. Pt plans  to have EGD completed with GI.  Clinical Impression: Clinical Impression: Ms. Maynor presents with deviations in typical swallow  physiology, though overall swallow function and safety are  judged to be  within gross functional limits. Anatomy notable for presence of cervical  hardware which does not appear to impede bolus flow or epiglottic  inversion. Oral phase within gross normal limits, though pt observed to  frequently tilt head back prior to swallow initiation. SLP questions if pt  is experiencing challenges in initiating swallow, pt attributes behavior  d/t dislike of barium. Primary abnormality of pharyngeal phase of swallow  related to timing. Bolus passes through pharynx and into upper esophagus  prior to swallow initiation and thus there is a delay in achieving full  airway protection (laryngeal vestibule closure, epiglottic inversion).  Pt's epiglottis is noted to curve toward anterior pharyngeal wall which  prohibits pooling of bolus into vallecula, which may be contributing  factor to full epiglottic inversion being achieved after bolus has  passed  to pyriform sinuses. Despite deviation in swallow timing, only transient  penetration seen with thin and mildly thick liquids. With pill  administration, pt c/o sensation of feeling pill travel through upper  esophagus. Esophageal sweep completed reveals pill has cleared pharynx and  esophagus when sensation occurring. Pt reports sensation c/w complaints  with pills and meats. SLP provided education on strategies to aid in  mitigating sensation: liquid wash, use of gravy or sauce to moisten  challenging meats. Recommend pt continue with oral diet with regular  solids and thin liquids. Encouraged pt to continue with work-up via GI to  address symptoms. Additionally, pt may benefit from f/u with ENT to  address presenting dysphonia, habitual throat clearing, and prior hx of VF  paralysis. Based on results from ENT, may benefit from f/u with OP SLP to  address the same.  Factors that may increase risk of adverse event in presence of aspiration  [name] & [name] 2021): No data recorded  Recommendations/Plan: Swallowing Evaluation Recommendations Swallowing Evaluation Recommendations Recommendations: PO diet PO Diet Recommendation: Regular; Thin liquids (Level 0) Liquid Administration via: Straw; Cup Medication Administration: Whole meds with liquid Supervision: Patient able to self-feed Swallowing strategies  : Follow solids with liquids (as needed for  reported sensation of food stuck) Postural changes: Position pt fully upright for meals Oral care recommendations: Oral care BID (2x/day) Recommended consults: Consider ENT consultation; Consider esophageal  assessment  Treatment Plan Treatment Plan Treatment recommendations: Defer treatment plan to SLP at other venue (see  follow-up recommendations) Follow-up recommendations: Outpatient SLP Recommendations Comment: pt may benefit from OP ST to address chronic  throat clearing and dysponia. Recommend ENT consult prior to OP  ST d/t  length of time since last seen by ENT  Recommendations Recommendations for follow up therapy are one component of a  multi-disciplinary discharge planning process, led by the attending  physician.  Recommendations may be updated based on patient status,  additional functional criteria and insurance authorization.  Assessment: Orofacial Exam: Orofacial Exam Oral Cavity: Oral Hygiene: WFL Oral Cavity - Dentition: Adequate natural dentition Orofacial Anatomy: Other (comment) Oral Motor/Sensory Function: Suspected cranial nerve impairment CN XII - Hypoglossal: Left motor impairment  Anatomy:  Anatomy: Presence of cervical hardware  Boluses Administered: Boluses Administered Boluses Administered: Thin liquids (Level 0); Mildly thick liquids (Level  2, nectar thick); Moderately thick liquids (Level 3, honey thick); Puree;  Solid     Oral Impairment Domain: Oral Impairment Domain Lip Closure: No labial escape Tongue control during bolus hold: Cohesive bolus between tongue to palatal  seal Bolus preparation/mastication: Timely and efficient chewing and mashing Bolus transport/lingual motion: Brisk tongue motion Oral residue:  Complete oral clearance Initiation of pharyngeal swallow : Pyriform sinuses     Pharyngeal Impairment Domain: Pharyngeal Impairment Domain Soft palate elevation: No bolus between soft palate (SP)/pharyngeal wall  (PW) Laryngeal elevation: Complete superior movement of thyroid cartilage with  complete approximation of arytenoids to epiglottic petiole Anterior hyoid excursion: Partial anterior movement Epiglottic movement: Complete inversion Laryngeal vestibule closure: Complete, no air/contrast in laryngeal  vestibule Pharyngeal stripping wave : Present - diminished Pharyngoesophageal segment opening: Complete distension and complete  duration, no obstruction of flow Tongue base retraction: No contrast between tongue base and posterior   pharyngeal wall (PPW) Pharyngeal residue: Trace residue within or on pharyngeal structures Location of pharyngeal residue: Valleculae; Pyriform sinuses     Esophageal Impairment Domain: Esophageal Impairment Domain Esophageal clearance upright position: Complete clearance, esophageal  coating  Pill: Pill Consistency administered: Thin liquids (Level 0) Thin liquids (Level 0): [facility]  Penetration/Aspiration Scale Score: Penetration/Aspiration Scale Score 1.  Material does not enter airway: Mildly thick liquids (Level 2, nectar  thick); Moderately thick liquids (Level 3, honey thick); Puree; Solid;  Pill 2.  Material enters airway, remains ABOVE vocal cords then ejected out:  Thin liquids (Level 0); Mildly thick liquids (Level 2, nectar thick)  Compensatory Strategies: Compensatory Strategies Compensatory strategies: No      General Information: Caregiver present: No   Diet Prior to this Study: Regular; Thin liquids (Level 0)    No data recorded   Respiratory Status: WFL    Supplemental O2: None (Room air)    No data recorded  Behavior/Cognition: Alert; Cooperative  Self-Feeding Abilities: Able to self-feed  Baseline vocal quality/speech: Dysphonic  No data recorded Volitional Swallow: Able to elicit  Exam Limitations: No limitations  Goal Planning: No data recorded No data recorded No data recorded Patient/Family Stated Goal: determine cause of reported symptoms  Consulted and agree with results and recommendations: Patient  Pain: Pain Assessment Pain Assessment: No/denies pain  End of Session: Start Time:SLP Start Time (ACUTE ONLY): 1130  Stop Time: SLP Stop Time (ACUTE ONLY): 1145  Time Calculation:SLP Time Calculation (min) (ACUTE ONLY): 15 min  Charges: SLP Evaluations $ SLP Speech Visit: 1 Visit  SLP Evaluations $Outpatient MBS Swallow: 1 Procedure  SLP visit diagnosis: SLP Visit Diagnosis: Dysphagia, oropharyngeal phase   (R13.12)  Past Medical History:  Past Medical History:  Diagnosis Date   Allergy 2013   Arthritis 1980   Asthma 2000   Breast cancer (HCC) 09/22/2021   DCIS   Bruit    Per patient   Cataracts, both eyes 2021   surgery 2022   Colon polyps    Environmental allergies 2013   Fibromyalgia    GERD (gastroesophageal reflux disease) 2013   Glaucoma 2016   POAG OU   History of hiatal hernia    Hyperlipidemia 2014   Hypertensive retinopathy 2021   OU   Macular degeneration 2014   Neuropathy 2013   legs and arms   Osteopenia 2000   Osteoporosis    Paralyzed vocal cords 2013   Per patient   Tongue atrophy 2013   Per patient r/t paralyzed vocal cords   Vitamin B12 deficiency 2021   Vitamin D deficiency 2021   Past Surgical History:  Past Surgical History:  Procedure Laterality Date   APPENDECTOMY  1981   with hysterectomy   BREAST LUMPECTOMY WITH RADIOACTIVE SEED LOCALIZATION Left 10/28/2021   Procedure: LEFT BREAST SEED LUMPECTOMY;  Surgeon: Harriette Bouillon, MD;   Location: [facility] OR;  Service: General;  Laterality: Left;   CATARACT EXTRACTION Bilateral    CERVICAL SPINE SURGERY  01/30/2020   Per patient   TOTAL ABDOMINAL HYSTERECTOMY  1981   Maia Breslow 11/30/2022, 4:17 PM  CLINICAL DATA:  Patient with complaints of occasional dysphagia.  EXAM: MODIFIED BARIUM SWALLOW  TECHNIQUE: Different consistencies of barium were administered orally to the patient by the Speech Pathologist. Imaging of the pharynx was performed in the lateral projection. Alwyn Ren, NP was present in the fluoroscopy room during this study, which was supervised and interpreted by Dr. Reche Dixon.  Radiologist, not in attendance for the exam.  Different consistencies of barium were administered orally to the patient by the Speech Pathologist. Imaging of the pharynx was performed in the lateral projection. The radiologist was present in the fluoroscopy room for this study, providing  personal supervision.  FLUOROSCOPY: Radiation Exposure Index (as provided by the fluoroscopic device): 15.55 mGy Kerma  COMPARISON:  DG Esophagus 05/06/22  FINDINGS: Vestibular  Penetration:  None seen.  Aspiration:  None seen.  Other: C4-C6 ACDF. 13 mm barium tablet passed normally into the stomach.  IMPRESSION: Normal barium study.  Please refer to the Speech Pathologists report for complete details and recommendations.  Electronically Signed   By: Jeronimo Greaves M.D.   On: 11/30/2022 11:51   Current Medications, Allergies, Past Medical History, Past Surgical History, Family History and Social History were reviewed in [employer] record.   Physical Exam: General: Well developed, well nourished, elderly, in pain from left hip, uses a can  Head: Normocephalic and atraumatic Eyes: Sclerae anicteric, EOMI Ears: Normal auditory acuity Mouth: No deformities or lesions noted Lungs: Clear throughout to auscultation Heart: Regular rate and rhythm; No murmurs, rubs or bruits Abdomen: Soft, non tender and non distended. No masses, hepatosplenomegaly or hernias noted. Normal Bowel sounds Rectal: Not done Musculoskeletal: Symmetrical with no gross deformities  Pulses:  Normal pulses noted Extremities: No edema or deformities noted Neurological: Alert oriented x 4, grossly nonfocal Psychological:  Alert and cooperative. Normal mood and affect   Tyasia Packard T. Russella Dar, MD 02/24/2023, 9:58 AM

## 2023-02-24 NOTE — Patient Instructions (Signed)
We have sent the following medications to your pharmacy for you to pick up at your convenience: Tramadol and famotidine.   The [facility] providers would like to encourage you to use [facility] to communicate with providers for non-urgent requests or questions.  Due to long hold times on the telephone, sending your provider a message by [facility] may be a faster and more efficient way to get a response.  Please allow 48 business hours for a response.  Please remember that this is for non-urgent requests.   Thank you for choosing me and [facility].  Venita Lick. Pleas Koch., MD., Clementeen Graham

## 2023-02-25 ENCOUNTER — Telehealth: Payer: Self-pay

## 2023-02-25 NOTE — Telephone Encounter (Signed)
Pharmacy Patient Advocate Encounter   Received notification from CoverMyMeds that prior authorization for Tramadol 50 mg tablets is required/requested.   Insurance verification completed.   The patient is insured through [facility] .   Per test claim: PA required; PA started via CoverMyMeds. KEY [id number] . Please see clinical question(s) below that I am not finding the answer to in her chart and advise.  Does the prescriber agree to assess the patient for signs and symptoms of serotonin syndrome? Serotonin syndrome is a potentially life-threatening syndrome/drug reaction. It is caused by medications, such as opioids, that build up high levels of serotonin in the body and cause side effects such as: seizures, loss of consciousness, irregular heartbeat, high fever, tremor, agitation, confusion, loss of muscle coordination or twitching muscles, etc.*            YES OR NO 2. Does the prescriber agree to participate in the Opioid Analgesic REMS program AND to monitor for abuse, misuse, addiction, and overdose and discontinue if necessary? A Risk Evaluation and Mitigation Strategy (REMS) is a drug safety program that the U.S. Food and Drug Administration (FDA) may require for certain medications with serious safety concerns to help ensure the benefits of the medication outweigh its risks.*          YES OR NO

## 2023-03-03 ENCOUNTER — Ambulatory Visit
Admission: RE | Admit: 2023-03-03 | Discharge: 2023-03-03 | Disposition: A | Discharge status: Home / Self Care | Payer: Medicare Other | Source: Ambulatory Visit | Attending: Adult Health | Admitting: Adult Health

## 2023-03-03 DIAGNOSIS — Z1239 Encounter for other screening for malignant neoplasm of breast: Secondary | ICD-10-CM

## 2023-03-03 DIAGNOSIS — Z853 Personal history of malignant neoplasm of breast: Secondary | ICD-10-CM | POA: Diagnosis not present

## 2023-03-03 DIAGNOSIS — D0512 Intraductal carcinoma in situ of left breast: Secondary | ICD-10-CM

## 2023-03-03 DIAGNOSIS — R92323 Mammographic fibroglandular density, bilateral breasts: Secondary | ICD-10-CM | POA: Diagnosis not present

## 2023-03-03 MED ORDER — GADOPICLENOL 0.5 MMOL/ML IV SOLN
7.0000 mL | Freq: Once | INTRAVENOUS | Status: AC | PRN
  Administered 2023-03-03: 7 mL via INTRAVENOUS

## 2023-03-04 NOTE — Progress Notes (Signed)
 Sent message, via epic in basket, requesting orders in epic from Careers adviser.

## 2023-03-07 ENCOUNTER — Telehealth: Payer: Self-pay

## 2023-03-07 NOTE — Telephone Encounter (Addendum)
 Called pt with message below. Pt verbalized understanding.----- Message from Morna JAYSON Kendall sent at 03/04/2023  4:58 PM EST ----- MRI negative for cancer please share good news with patient ----- Message ----- From: Interface, Rad Results In Sent: 03/03/2023   3:33 PM EST To: Morna Dalton Kendall, NP

## 2023-03-08 ENCOUNTER — Ambulatory Visit (INDEPENDENT_AMBULATORY_CARE_PROVIDER_SITE_OTHER): Payer: Medicare Other | Admitting: Family

## 2023-03-08 VITALS — BP 134/86 | HR 92 | Ht 61.0 in | Wt 150.2 lb

## 2023-03-08 DIAGNOSIS — M25552 Pain in left hip: Secondary | ICD-10-CM | POA: Diagnosis not present

## 2023-03-08 DIAGNOSIS — R5383 Other fatigue: Secondary | ICD-10-CM

## 2023-03-08 DIAGNOSIS — Z01818 Encounter for other preprocedural examination: Secondary | ICD-10-CM | POA: Diagnosis not present

## 2023-03-08 DIAGNOSIS — G8929 Other chronic pain: Secondary | ICD-10-CM | POA: Diagnosis not present

## 2023-03-08 NOTE — Progress Notes (Addendum)
 COVID Vaccine received:  []  No [x]  Yes Date of any COVID positive Test in last 90 days: no PCP - Leita Elbe FNP Cardiologist - no  Chest x-ray -  EKG -  03/08/23 Epic Stress Test -  ECHO -  Cardiac Cath -   Bowel Prep - [x]  No  []   Yes ______  Pacemaker / ICD device [x]  No []  Yes   Spinal Cord Stimulator:[x]  No []  Yes       History of Sleep Apnea? [x]  No []  Yes   CPAP used?- [x]  No []  Yes    Does the patient monitor blood sugar?          [x]  No []  Yes  []  N/A  Patient has: [x]  NO Hx DM   []  Pre-DM                 []  DM1  []   DM2 Does patient have a [employer] or Dexacom? []  No []  Yes   Fasting Blood Sugar Ranges-  Checks Blood Sugar _____ times a day  GLP1 agonist / usual dose - no GLP1 instructions:  SGLT-2 inhibitors / usual dose - no SGLT-2 instructions:   Blood Thinner / Instructions:no Aspirin  Instructions:no  Comments:   Activity level: Patient is able to climb a flight of stairs without difficulty; [x]  No CP  [x]  No SOB,___   Patient can perform ADLs without assistance.   Anesthesia review:   Patient denies shortness of breath, fever, cough and chest pain at PAT appointment.  Patient verbalized understanding and agreement to the Pre-Surgical Instructions that were given to them at this PAT appointment. Patient was also educated of the need to review these PAT instructions again prior to his/her surgery.I reviewed the appropriate phone numbers to call if they have any and questions or concerns.

## 2023-03-08 NOTE — Progress Notes (Signed)
 Ruth Wolfe is a 78 y.o. 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     Cholecalciferol (VITAMIN D ) 50 MCG (2000 UT) tablet Take 4,000 Units by mouth daily.     famotidine  (PEPCID ) 40 MG tablet Take 1 tablet (40 mg total) by mouth daily. 30 tablet 11   fluticasone  (FLONASE ) 50 MCG/ACT nasal spray SPRAY 2 SPRAYS INTO EACH NOSTRIL EVERY DAY 48 mL 2   gabapentin  (NEURONTIN ) 300 MG capsule Take 300 mg by mouth 3 (three) times daily.     MIEBO 1.338 GM/ML SOLN Apply 1 drop to eye 4 (four) times daily.     moxifloxacin (VIGAMOX) 0.5 % ophthalmic solution 1 drop 3 (three) times daily.     naproxen sodium (ALEVE) 220 MG tablet Take 220 mg by mouth as needed.     neomycin-bacitracin-polymyxin (NEOSPORIN) ophthalmic ointment 1 Application 4 (four) times daily.     RHOPRESSA 0.02 % SOLN Place 1 drop into both eyes daily.     vitamin B-12 (CYANOCOBALAMIN ) 1000 MCG tablet Take 1,000 mcg by mouth daily.     WIXELA INHUB 250-50 MCG/ACT AEPB INHALE 1 PUFF INTO THE LUNGS IN THE  MORNING AND AT BEDTIME. 180 each 3   No current facility-administered medications for this visit.    Allergies: Misc. sulfonamide containing compounds and Sulfa antibiotics  Past Medical History:  Diagnosis Date   Allergy  2013   Arthritis 1980   Asthma 2000   Breast cancer (HCC) 09/22/2021   DCIS   Bruit    Per patient   Cataracts, both eyes 2021   surgery 2022   Colon polyps    Environmental allergies 2013   Fibromyalgia    GERD (gastroesophageal reflux disease) 2013   Glaucoma 2016   POAG OU   History of hiatal hernia    Hyperlipidemia 2014   Hypertensive retinopathy 2021   OU   Macular degeneration 2014   Neuropathy 2013   legs and arms   Osteopenia 2000   Osteoporosis    Paralyzed vocal cords 2013   Per patient   Tongue atrophy 2013   Per patient r/t paralyzed vocal cords   Vitamin B12 deficiency 2021   Vitamin D  deficiency 2021    Past Surgical History:  Procedure Laterality Date   APPENDECTOMY  1981   with hysterectomy   BREAST LUMPECTOMY WITH RADIOACTIVE SEED LOCALIZATION Left 10/28/2021   Procedure: LEFT BREAST SEED LUMPECTOMY;  Surgeon: Vanderbilt Ned, MD;  Location: [facility] OR;  Service: General;  Laterality: Left;   CATARACT EXTRACTION Bilateral    CERVICAL SPINE SURGERY  01/30/2020  Per patient   TOTAL ABDOMINAL HYSTERECTOMY  1981   UPPER GASTROINTESTINAL ENDOSCOPY  12/10/2022    Family History  Problem Relation Age of Onset   Hypertension Mother    Lung cancer Father        he smoked   Glaucoma Father    Gout Father    Alzheimer's disease Father    Hypertension Sister    Liver disease Sister    Brain cancer Cousin        glioblastoma, paternal first cousin   Colon cancer Neg Hx    Stomach cancer Neg Hx     Social History   Tobacco Use   Smoking status: Former    Types: Cigarettes   Smokeless tobacco: Never   Tobacco comments:    College years  Substance Use Topics   Alcohol use: Yes    Alcohol/week: 5.0 - 6.0 standard drinks of  alcohol    Types: 5 - 6 Cans of beer per week    Comment: 2-3 per day    Subjective:   Will be having left hip replacement surgery on January 20; very frustrated with the pain/ how the hip is affecting her quality of life; optimistic about how the surgery will improve her quality of life;  Orthopedist is requesting EKG and updated labs today;   Objective:  Vitals:   03/08/23 1308  BP: 134/86  Pulse: 92  SpO2: 95%  Weight: 150 lb 3.2 oz (68.1 kg)  Height: 5' 1 (1.549 m)    General: Well developed, well nourished, in no acute distress  Skin : Warm and dry.  Head: Normocephalic and atraumatic  Eyes: Sclera and conjunctiva clear; pupils round and reactive to light; extraocular movements intact  Ears: External normal; canals clear; tympanic membranes normal  Oropharynx: Pink, supple. No suspicious lesions  Neck: Supple without thyromegaly, adenopathy  Lungs: Respirations unlabored; clear to auscultation bilaterally without wheeze, rales, rhonchi  CVS exam: normal rate and regular rhythm.  Neurologic: Alert and oriented; speech intact; face symmetrical; uses cane for stability;   Assessment:  1. Chronic left hip pain   2. Other fatigue   3. Pre-op exam     Plan:  EKG shows sinus rhythm; check CBC, CMP today; surgical clearance will be given as requested; optimistic that patient's quality of life will be improved very quickly after upcoming surgery;   No follow-ups on file.  Orders Placed This Encounter  Procedures   CBC with Differential/Platelet   Comp Met (CMET)   EKG 12-Lead    Requested Prescriptions    No prescriptions requested or ordered in this encounter

## 2023-03-09 LAB — COMPREHENSIVE METABOLIC PANEL
ALT: 11 U/L (ref 0–35)
AST: 15 U/L (ref 0–37)
Albumin: 4.2 g/dL (ref 3.5–5.2)
Alkaline Phosphatase: 68 U/L (ref 39–117)
BUN: 13 mg/dL (ref 6–23)
CO2: 29 meq/L (ref 19–32)
Calcium: 9.6 mg/dL (ref 8.4–10.5)
Chloride: 100 meq/L (ref 96–112)
Creatinine, Ser: 0.73 mg/dL (ref 0.40–1.20)
GFR: 78.37 mL/min (ref >60.00)
Glucose, Bld: 81 mg/dL (ref 70–99)
Potassium: 4.4 meq/L (ref 3.5–5.1)
Sodium: 137 meq/L (ref 135–145)
Total Bilirubin: 0.6 mg/dL (ref 0.2–1.2)
Total Protein: 6.8 g/dL (ref 6.0–8.3)

## 2023-03-09 LAB — CBC WITH DIFFERENTIAL/PLATELET
Basophils Absolute: 0.1 10*3/uL (ref 0.0–0.1)
Basophils Relative: 0.7 % (ref 0.0–3.0)
Eosinophils Absolute: 0.3 10*3/uL (ref 0.0–0.7)
Eosinophils Relative: 3.6 % (ref 0.0–5.0)
HCT: 39 % (ref 36.0–46.0)
Hemoglobin: 12.7 g/dL (ref 12.0–15.0)
Lymphocytes Relative: 18.7 % (ref 12.0–46.0)
Lymphs Abs: 1.6 10*3/uL (ref 0.7–4.0)
MCHC: 32.7 g/dL (ref 30.0–36.0)
MCV: 103.2 fL — ABNORMAL HIGH (ref 78.0–100.0)
Monocytes Absolute: 0.9 10*3/uL (ref 0.1–1.0)
Monocytes Relative: 11 % (ref 3.0–12.0)
Neutro Abs: 5.6 10*3/uL (ref 1.4–7.7)
Neutrophils Relative %: 66 % (ref 43.0–77.0)
Platelets: 315 10*3/uL (ref 150.0–400.0)
RBC: 3.78 Mil/uL — ABNORMAL LOW (ref 3.87–5.11)
RDW: 13.4 % (ref 11.5–15.5)
WBC: 8.5 10*3/uL (ref 4.0–10.5)

## 2023-03-10 NOTE — Patient Instructions (Addendum)
 SURGICAL WAITING ROOM VISITATION  Patients having surgery or a procedure may have no more than 2 support people in the waiting area - these visitors may rotate.    Children under the age of 72 must have an adult with them who is not the patient.  Due to an increase in RSV and influenza rates and associated hospitalizations, children ages 81 and under may not visit patients in [facility] hospitals.  If the patient needs to stay at the hospital during part of their recovery, the visitor guidelines for inpatient rooms apply. Pre-op nurse will coordinate an appropriate time for 1 support person to accompany patient in pre-op.  This support person may not rotate.    Please refer to the [facility] website for the visitor guidelines for Inpatients (after your surgery is over and you are in a regular room).       Your procedure is scheduled on: 03/21/23   Report to [facility] Main Entrance    Report to admitting at  7:30 AM   Call this number if you have problems the morning of surgery [phone]   Do not eat food  or drink liquids :After Midnight.    Oral Hygiene is also important to reduce your risk of infection.                                    Remember - BRUSH YOUR TEETH THE MORNING OF SURGERY WITH YOUR REGULAR TOOTHPASTE   Stop all vitamins and herbal supplements 7 days before surgery.   Take these medicines the morning of surgery with A SIP OF WATER: Famotidine , Gabapentin , Omeprazole , tylenol  if needed.             You may not have any metal on your body including hair pins, jewelry, and body piercing             Do not wear make-up, lotions, powders, perfumes/cologne, or deodorant  Do not wear nail polish including gel and S&S, artificial/acrylic nails, or any other type of covering on natural nails including finger and toenails. If you have artificial nails, gel coating, etc. that needs to be removed by a nail salon please have this removed prior to surgery or  surgery may need to be canceled/ delayed if the surgeon/ anesthesia feels like they are unable to be safely monitored.   Do not shave  48 hours prior to surgery.    Do not bring valuables to the hospital. [facility] IS NOT             RESPONSIBLE   FOR VALUABLES.   Contacts, glasses, dentures or bridgework may not be worn into surgery.   Bring small overnight bag day of surgery.   DO NOT BRING YOUR HOME MEDICATIONS TO THE HOSPITAL. PHARMACY WILL DISPENSE MEDICATIONS LISTED ON YOUR MEDICATION LIST TO YOU DURING YOUR ADMISSION IN THE HOSPITAL!    Patients discharged on the day of surgery will not be allowed to drive home.  Someone NEEDS to stay with you for the first 24 hours after anesthesia.   Special Instructions: Bring a copy of your healthcare power of attorney and living will documents the day of surgery if you haven't scanned them before.              Please read over the following fact sheets you were given: IF YOU HAVE QUESTIONS ABOUT YOUR PRE-OP INSTRUCTIONS PLEASE CALL  [phone] [place]   If you received a COVID test during your pre-op visit  it is requested that you wear a mask when out in public, stay away from anyone that may not be feeling well and notify your surgeon if you develop symptoms. If you test positive for Covid or have been in contact with anyone that has tested positive in the last 10 days please notify you surgeon.      Pre-operative 5 CHG Bath Instructions   You can play a key role in reducing the risk of infection after surgery. Your skin needs to be as free of germs as possible. You can reduce the number of germs on your skin by washing with CHG (chlorhexidine  gluconate) soap before surgery. CHG is an antiseptic soap that kills germs and continues to kill germs even after washing.   DO NOT use if you have an allergy  to chlorhexidine /CHG or antibacterial soaps. If your skin becomes reddened or irritated, stop using the CHG and notify one of our RNs at  [phone].   Please shower with the CHG soap starting 4 days before surgery using the following schedule:     Please keep in mind the following:  DO NOT shave, including legs and underarms, starting the day of your first shower.   You may shave your face at any point before/day of surgery.  Place clean sheets on your bed the day you start using CHG soap. Use a clean washcloth (not used since being washed) for each shower. DO NOT sleep with pets once you start using the CHG.   CHG Shower Instructions:  If you choose to wash your hair and private area, wash first with your normal shampoo/soap.  After you use shampoo/soap, rinse your hair and body thoroughly to remove shampoo/soap residue.  Turn the water OFF and apply about 3 tablespoons (45 ml) of CHG soap to a CLEAN washcloth.  Apply CHG soap ONLY FROM YOUR NECK DOWN TO YOUR TOES (washing for 3-5 minutes)  DO NOT use CHG soap on face, private areas, open wounds, or sores.  Pay special attention to the area where your surgery is being performed.  If you are having back surgery, having someone wash your back for you may be helpful. Wait 2 minutes after CHG soap is applied, then you may rinse off the CHG soap.  Pat dry with a clean towel  Put on clean clothes/pajamas   If you choose to wear lotion, please use ONLY the CHG-compatible lotions on the back of this paper.     Additional instructions for the day of surgery: DO NOT APPLY any lotions, deodorants, cologne, or perfumes.   Put on clean/comfortable clothes.  Brush your teeth.  Ask your nurse before applying any prescription medications to the skin.      CHG Compatible Lotions   Aveeno Moisturizing lotion  Cetaphil Moisturizing Cream  Cetaphil Moisturizing Lotion  Clairol Herbal Essence Moisturizing Lotion, Dry Skin  Clairol Herbal Essence Moisturizing Lotion, Extra Dry Skin  Clairol Herbal Essence Moisturizing Lotion, Normal Skin  Curel Age Defying Therapeutic  Moisturizing Lotion with Alpha Hydroxy  Curel Extreme Care Body Lotion  Curel Soothing Hands Moisturizing Hand Lotion  Curel Therapeutic Moisturizing Cream, Fragrance-Free  Curel Therapeutic Moisturizing Lotion, Fragrance-Free  Curel Therapeutic Moisturizing Lotion, Original Formula  Eucerin Daily Replenishing Lotion  Eucerin Dry Skin Therapy Plus Alpha Hydroxy Crme  Eucerin Dry Skin Therapy Plus Alpha Hydroxy Lotion  Eucerin Original Crme  Eucerin Original Lotion  Eucerin Plus Crme  Eucerin Plus Lotion  Eucerin TriLipid Replenishing Lotion  Keri Anti-Bacterial Hand Lotion  Keri Deep Conditioning Original Lotion Dry Skin Formula Softly Scented  Keri Deep Conditioning Original Lotion, Fragrance Free Sensitive Skin Formula  Keri Lotion Fast Absorbing Fragrance Free Sensitive Skin Formula  Keri Lotion Fast Absorbing Softly Scented Dry Skin Formula  Keri Original Lotion  Keri Skin Renewal Lotion Keri Silky Smooth Lotion  Keri Silky Smooth Sensitive Skin Lotion  Nivea Body Creamy Conditioning Oil  Nivea Body Extra Enriched Teacher, Adult Education Moisturizing Lotion Nivea Crme  Nivea Skin Firming Lotion  NutraDerm 30 Skin Lotion  NutraDerm Skin Lotion  NutraDerm Therapeutic Skin Cream  NutraDerm Therapeutic Skin Lotion  ProShield Protective Hand Cream  Provon moisturizing lotion

## 2023-03-10 NOTE — Progress Notes (Signed)
 Please send pre op orders for PST appointment 03/11/23.

## 2023-03-11 ENCOUNTER — Encounter (HOSPITAL_COMMUNITY): Payer: Self-pay

## 2023-03-11 ENCOUNTER — Encounter (HOSPITAL_COMMUNITY)
Admission: RE | Admit: 2023-03-11 | Discharge: 2023-03-11 | Disposition: A | Discharge status: Home / Self Care | Payer: Medicare Other | Source: Ambulatory Visit | Attending: Orthopedic Surgery | Admitting: Orthopedic Surgery

## 2023-03-11 ENCOUNTER — Other Ambulatory Visit: Payer: Self-pay

## 2023-03-11 VITALS — BP 154/89 | HR 89 | Temp 98.2°F | Resp 16 | Ht 61.0 in | Wt 151.0 lb

## 2023-03-11 DIAGNOSIS — Z01812 Encounter for preprocedural laboratory examination: Secondary | ICD-10-CM | POA: Insufficient documentation

## 2023-03-11 DIAGNOSIS — Z01818 Encounter for other preprocedural examination: Secondary | ICD-10-CM | POA: Diagnosis present

## 2023-03-11 LAB — SURGICAL PCR SCREEN
MRSA, PCR: NEGATIVE
Staphylococcus aureus: NEGATIVE

## 2023-03-14 DIAGNOSIS — H401122 Primary open-angle glaucoma, left eye, moderate stage: Secondary | ICD-10-CM | POA: Diagnosis not present

## 2023-03-14 DIAGNOSIS — H10023 Other mucopurulent conjunctivitis, bilateral: Secondary | ICD-10-CM | POA: Diagnosis not present

## 2023-03-14 DIAGNOSIS — H401111 Primary open-angle glaucoma, right eye, mild stage: Secondary | ICD-10-CM | POA: Diagnosis not present

## 2023-03-14 NOTE — Progress Notes (Signed)
 [facility] - Clinic Note  03/16/2023     CHIEF COMPLAINT Patient presents for Retina Follow Up    HISTORY OF PRESENT ILLNESS: Ruth Wolfe is a 78 y.o. female who presents to the clinic today for:   HPI     Retina Follow Up   In both eyes.  This started 6 months ago.  Duration of 6 months.  Since onset it is stable.  I, the attending physician,  performed the HPI with the patient and updated documentation appropriately.        Comments   6 month retina follow up VMT OU pt is reporting no vision changes noticed she is having some flashes denies any floaters pt saw Dr Donzetta Galli and she changed her gtts rhopressa ou at bedtime pt is having SLT Feb 24th she is having hip replacement on Monday Jan 20th        Last edited by Ronelle Coffee, MD on 03/18/2023  1:19 AM.     Patient states she is having hip surgery on Monday 12.20.25. She feels that her vision is the same.   Referring physician: Adra Alanis, FNP [address] [place],  [place] [address]  HISTORICAL INFORMATION:  Selected notes from the MEDICAL RECORD NUMBER Referred by Dr. Donovan Gallant for concern of VMT OU LEE: 03.18.21 Danelle Dunning) [BCVA: OD: 20/30-- OS: 20/50] Ocular Hx-POAG OU, VMT OU, DES OU, cataracts OU, HTN ret OU PMH-HLD   CURRENT MEDICATIONS: Current Outpatient Medications (Ophthalmic Drugs)  Medication Sig   brimonidine (ALPHAGAN) 0.2 % ophthalmic solution Place 1 drop into both eyes 2 (two) times daily.   MIEBO 1.338 GM/ML SOLN Apply 1 drop to eye 4 (four) times daily.   moxifloxacin (VIGAMOX) 0.5 % ophthalmic solution Place 1 drop into both eyes 3 (three) times daily.   RHOPRESSA 0.02 % SOLN Place 1 drop into both eyes at bedtime.   No current facility-administered medications for this visit. (Ophthalmic Drugs)   Current Outpatient Medications (Other)  Medication Sig   acetaminophen  (TYLENOL ) 500 MG tablet Take 500 mg by mouth every 8 (eight)  hours as needed for moderate pain (pain score 4-6).   Cholecalciferol (VITAMIN D ) 50 MCG (2000 UT) tablet Take 4,000 Units by mouth daily.   famotidine  (PEPCID ) 40 MG tablet Take 40 mg by mouth daily.   fluticasone  (FLONASE ) 50 MCG/ACT nasal spray SPRAY 2 SPRAYS INTO EACH NOSTRIL EVERY DAY (Patient taking differently: Place 1 spray into both nostrils daily as needed for allergies.)   gabapentin  (NEURONTIN ) 300 MG capsule Take 300 mg by mouth 3 (three) times daily.   naproxen sodium (ALEVE) 220 MG tablet Take 440 mg by mouth at bedtime.   traMADol  (ULTRAM ) 50 MG tablet Take 50 mg by mouth at bedtime.   vitamin B-12 (CYANOCOBALAMIN ) 1000 MCG tablet Take 1,000 mcg by mouth daily.   WIXELA INHUB 250-50 MCG/ACT AEPB INHALE 1 PUFF INTO THE LUNGS IN THE MORNING AND AT BEDTIME. (Patient taking differently: Inhale 1 puff into the lungs at bedtime.)   No current facility-administered medications for this visit. (Other)   REVIEW OF SYSTEMS: ROS   Positive for: Gastrointestinal, Musculoskeletal, Eyes, Respiratory Negative for: Constitutional, Neurological, Skin, Genitourinary, HENT, Endocrine, Cardiovascular, Psychiatric, Allergic/Imm, Heme/Lymph Last edited by Alise Appl, COT on 03/16/2023  2:02 PM.       ALLERGIES Allergies  Allergen Reactions   Misc. Sulfonamide Containing Compounds Other (See Comments)   Sulfa Antibiotics  Wheezing, swelling face and ears   PAST MEDICAL HISTORY Past Medical History:  Diagnosis Date   Allergy  2013   Arthritis 1980   Asthma 2000   Breast cancer (HCC) 09/22/2021   DCIS   Bruit    Per patient   Cataracts, both eyes 2021   surgery 2022   Colon polyps    Environmental allergies 2013   Fibromyalgia    GERD (gastroesophageal reflux disease) 2013   Glaucoma 2016   POAG OU   History of hiatal hernia    Hyperlipidemia 2014   Hypertensive retinopathy 2021   OU   Macular degeneration 2014   Neuropathy 2013   legs and arms   Osteopenia  2000   Osteoporosis    Paralyzed vocal cords 2013   Per patient   Tongue atrophy 2013   Per patient r/t paralyzed vocal cords   Vitamin B12 deficiency 2021   Vitamin D  deficiency 2021   Past Surgical History:  Procedure Laterality Date   APPENDECTOMY  1981   with hysterectomy   BREAST LUMPECTOMY WITH RADIOACTIVE SEED LOCALIZATION Left 10/28/2021   Procedure: LEFT BREAST SEED LUMPECTOMY;  Surgeon: Sim Dryer, MD;  Location: [facility] OR;  Service: General;  Laterality: Left;   CATARACT EXTRACTION Bilateral    CERVICAL SPINE SURGERY  01/30/2020   Per patient   TOTAL ABDOMINAL HYSTERECTOMY  1981   UPPER GASTROINTESTINAL ENDOSCOPY  12/10/2022   FAMILY HISTORY Family History  Problem Relation Age of Onset   Hypertension Mother    Lung cancer Father        he smoked   Glaucoma Father    Gout Father    Alzheimer's disease Father    Hypertension Sister    Liver disease Sister    Brain cancer Cousin        glioblastoma, paternal first cousin   Colon cancer Neg Hx    Stomach cancer Neg Hx    SOCIAL HISTORY Social History   Tobacco Use   Smoking status: Former    Types: Cigarettes   Smokeless tobacco: Never   Tobacco comments:    College years  Vaping Use   Vaping status: Never Used  Substance Use Topics   Alcohol use: Yes    Alcohol/week: 2.0 standard drinks of alcohol    Types: 1 Glasses of wine, 1 Cans of beer per week    Comment: 1 per day   Drug use: No       OPHTHALMIC EXAM:  Base Eye Exam     Visual Acuity (Snellen - Linear)       Right Left   Dist cc 20/40 -2 20/60 -2   Dist ph cc NI NI    Correction: Glasses         Tonometry (Tonopen, 2:08 PM)       Right Left   Pressure 15 20         Pupils       Pupils Dark Light Shape React APD   Right PERRL 3 2 Round Brisk None   Left PERRL 3 2 Round Brisk None         Visual Fields       Left Right    Full Full         Extraocular Movement       Right Left    Full, Ortho Full, Ortho          Neuro/Psych     Oriented x3: Yes   Mood/Affect: Normal  Dilation     Both eyes: 2.5% Phenylephrine  @ 2:09 PM           Slit Lamp and Fundus Exam     Slit Lamp Exam       Right Left   Lids/Lashes Mild Dermatochalasis - upper lid, mild MGD Mild Dermatochalasis - upper lid   Conjunctiva/Sclera White and quiet,1+ injection White and quiet   Cornea Arcus, 3+ Punctate epithelial erosions, well healed temporal cataract wounds, faint iron line inferiorly Arcus, 1-2+ Punctate epithelial erosions, fine endopigment, well healed cataract wounds, faint iron line inferiorly   Anterior Chamber deep and clear, narrow temporal angle, no cell or flare deep and clear, narrow temporal angle   Iris Round and dilated Round and dilated   Lens PC IOL in good position PC IOL in good position   Anterior Vitreous Posterior vitreous detachment, vitreous syneresis Vitreous syneresis         Fundus Exam       Right Left   Disc Tilted disc, +cupping, temporal PPA, Sharp rim, thin inferior rim Tilted disc, +cupping, temporal PPA, mild Pallor, Sharp rim   C/D Ratio 0.75 0.6   Macula Flat, Blunted foveal reflex, central Cystic changes--improved, Drusen, RPE mottling and clumping, no heme Flat, Blunted foveal reflex, central macular cyst -- improved, Drusen, RPE mottling and clumping, no heme, central vitelliform like lesion-- improved to focal pigment clump   Vessels attenuated, mild tortuousity, mild AV crossing changes attenuated, Tortuous   Periphery Attached, peripheral drusen, reticular degeneration, No heme Attached, peripheral drusen, reticular degeneration, No heme           Refraction     Wearing Rx       Sphere Cylinder Axis Add   Right -1.00 +1.25 022 +2.50   Left -1.50 +2.50 158 +2.50    Type: PAL           IMAGING AND PROCEDURES  Imaging and Procedures for @TODAY @  OCT, Retina - OU - Both Eyes       Right Eye Quality was good. Central Foveal  Thickness: 325. Progression has improved. Findings include no SRF, abnormal foveal contour, myopic contour, retinal drusen , intraretinal fluid, vitreous traction (VMT with mild interval improvement in cystic changes--VMT releasing).   Left Eye Quality was good. Central Foveal Thickness: 230. Progression has been stable. Findings include normal foveal contour, no IRF, no SRF, retinal drusen , vitreous traction (stable release of central VMT and stable resolution of central cystic changes, partial PVD).   Notes *Images captured and stored on drive  Diagnosis / Impression:  Non-exu ARMD OU VMT with central cystic changes OU OD: VMT with mild interval improvement in cystic changes--VMT releasing OS: stable release of central VMT and stable resolution of central cystic changes, partial PVD  Clinical management:  See below  Abbreviations: NFP - Normal foveal profile. CME - cystoid macular edema. PED - pigment epithelial detachment. IRF - intraretinal fluid. SRF - subretinal fluid. EZ - ellipsoid zone. ERM - epiretinal membrane. ORA - outer retinal atrophy. ORT - outer retinal tubulation. SRHM - subretinal hyper-reflective material            ASSESSMENT/PLAN:    ICD-10-CM   1. Vitreomacular adhesion of both eyes  H43.823     2. Intermediate stage nonexudative age-related macular degeneration of both eyes  H35.3132 OCT, Retina - OU - Both Eyes    3. Essential hypertension  I10     4. Hypertensive retinopathy of both eyes  H35.033  5. Pseudophakia, both eyes  Z96.1     6. Primary open angle glaucoma of both eyes, unspecified glaucoma stage  H40.1130     7. Dry eyes  H04.123      1. VMT with central cystic changes OU  - BCVA OD 20/40; OS 20/60 - stable OU   - asymptomatic, no metamorphopsia - OCT shows OD: VMT with mild interval improvement in cystic changes--VMT releasing; OS: stable release of central VMT and stable resolution of central cystic changes, partial PVD -  discussed possibility of spontaneous release of VMT vs progression to macular hole  - no indication for surgery at this time and pt wishes to monitor for now -- reasonable  - f/u 6 months, sooner prn - repeat DFE/OCT  2. Age related macular degeneration, non-exudative, both eyes  - stable intermediate stage disease - The incidence, anatomy, and pathology of dry AMD, risk of progression, and the AREDS and AREDS 2 study including smoking risks discussed with patient. Patient was instructed to continue taking AREDS despite her not liking to take pills.    - recommend Amsler grid monitoring  3,4. Hypertensive retinopathy OU  - discussed importance of tight BP control  - monitor  5. Pseudophakia OU  - s/p CE/IOL (OS: 09.01.21, OD: 09.15.21, Dr. Reyne Cave)  - IOLs in good position, doing well  - monitor  6. POAG OU  - under the expert management of Dr. Carloyn Chi  - IOP today: 15, 20 - on Rhopressa every day OU, managed by Dr. Carloyn Chi.   - Scheduled SLT OU end of February w/ Dr. Carloyn Chi  7. Dry eyes OU  - recommend artificial tears and lubricating ointment as needed  - using Meibo OU QID per Dr. Carloyn Chi  Ophthalmic Meds Ordered this visit:  No orders of the defined types were placed in this encounter.    Return in about 6 months (around 09/13/2023) for f/u VMT OU , DFE, OCT.  This document serves as a record of services personally performed by Jeanice Millard, MD, PhD. It was created on their behalf by Morley Arabia. Bevin Bucks, OA an ophthalmic technician. The creation of this record is the provider's dictation and/or activities during the visit.    Electronically signed by: Morley Arabia. Bevin Bucks, OA 03/18/23 1:19 AM  This document serves as a record of services personally performed by Jeanice Millard, MD, PhD. It was created on their behalf by Olene Berne, COT an ophthalmic technician. The creation of this record is the provider's dictation and/or activities during the visit.    Electronically signed by:   Olene Berne, COT  03/18/23 1:19 AM  Jeanice Millard, M.D., Ph.D. Diseases & Surgery of the Retina and Vitreous [facility]  I have reviewed the above documentation for accuracy and completeness, and I agree with the above. Jeanice Millard, M.D., Ph.D. 03/18/23 1:20 AM   Abbreviations: M myopia (nearsighted); A astigmatism; H hyperopia (farsighted); P presbyopia; Mrx spectacle prescription;  CTL contact lenses; OD right eye; OS left eye; OU both eyes  XT exotropia; ET esotropia; PEK punctate epithelial keratitis; PEE punctate epithelial erosions; DES dry eye syndrome; MGD meibomian gland dysfunction; ATs artificial tears; PFAT's preservative free artificial tears; NSC nuclear sclerotic cataract; PSC posterior subcapsular cataract; ERM epi-retinal membrane; PVD posterior vitreous detachment; RD retinal detachment; DM diabetes mellitus; DR diabetic retinopathy; NPDR non-proliferative diabetic retinopathy; PDR proliferative diabetic retinopathy; CSME clinically significant macular edema; DME diabetic macular edema; dbh dot blot hemorrhages;  CWS cotton wool spot; POAG primary open angle glaucoma; C/D cup-to-disc ratio; HVF humphrey visual field; GVF goldmann visual field; OCT optical coherence tomography; IOP intraocular pressure; BRVO Branch retinal vein occlusion; CRVO central retinal vein occlusion; CRAO central retinal artery occlusion; BRAO branch retinal artery occlusion; RT retinal tear; SB scleral buckle; PPV pars plana vitrectomy; VH Vitreous hemorrhage; PRP panretinal laser photocoagulation; IVK intravitreal kenalog ; VMT vitreomacular traction; MH Macular hole;  NVD neovascularization of the disc; NVE neovascularization elsewhere; AREDS age related eye disease study; ARMD age related macular degeneration; POAG primary open angle glaucoma; EBMD epithelial/anterior basement membrane dystrophy; ACIOL anterior chamber intraocular lens; IOL intraocular lens; PCIOL posterior  chamber intraocular lens; Phaco/IOL phacoemulsification with intraocular lens placement; PRK photorefractive keratectomy; LASIK laser assisted in situ keratomileusis; HTN hypertension; DM diabetes mellitus; COPD chronic obstructive pulmonary disease

## 2023-03-16 ENCOUNTER — Ambulatory Visit (INDEPENDENT_AMBULATORY_CARE_PROVIDER_SITE_OTHER): Payer: Medicare Other | Admitting: Ophthalmology

## 2023-03-16 ENCOUNTER — Encounter (INDEPENDENT_AMBULATORY_CARE_PROVIDER_SITE_OTHER): Payer: Self-pay | Admitting: Ophthalmology

## 2023-03-16 DIAGNOSIS — H43823 Vitreomacular adhesion, bilateral: Secondary | ICD-10-CM | POA: Diagnosis not present

## 2023-03-16 DIAGNOSIS — Z961 Presence of intraocular lens: Secondary | ICD-10-CM

## 2023-03-16 DIAGNOSIS — H401130 Primary open-angle glaucoma, bilateral, stage unspecified: Secondary | ICD-10-CM

## 2023-03-16 DIAGNOSIS — I10 Essential (primary) hypertension: Secondary | ICD-10-CM

## 2023-03-16 DIAGNOSIS — H35033 Hypertensive retinopathy, bilateral: Secondary | ICD-10-CM

## 2023-03-16 DIAGNOSIS — H353132 Nonexudative age-related macular degeneration, bilateral, intermediate dry stage: Secondary | ICD-10-CM | POA: Diagnosis not present

## 2023-03-16 DIAGNOSIS — H04123 Dry eye syndrome of bilateral lacrimal glands: Secondary | ICD-10-CM

## 2023-03-18 ENCOUNTER — Encounter (INDEPENDENT_AMBULATORY_CARE_PROVIDER_SITE_OTHER): Payer: Self-pay | Admitting: Ophthalmology

## 2023-03-20 NOTE — H&P (Signed)
TOTAL HIP ADMISSION H&P  Patient is admitted for left total hip arthroplasty.  Therapy Plans: HEP Disposition: Home with Mariane Duval (friend - staying with her few days) Planned DVT Prophylaxis: aspirin 81mg  BID DME needed: none PCP: Ria Clock - clearance received Oncology -- benign lesion removed last year TXA: IV Allergies: sulfa - facial swelling Anesthesia Concerns: none BMI: 28.2 Last HgbA1c: Not diabetic   Other: - no hx of VTE or cancer - tramadol/oxycodone, robaxin, tylenol, celebrex - [facility]  Subjective:  Chief Complaint: left hip pain  HPI: Ruth Wolfe, 78 y.o. female, has a history of pain and functional disability in the left hip(s) due to arthritis and patient has failed non-surgical conservative treatments for greater than 12 weeks to include NSAID's and/or analgesics and activity modification.  Onset of symptoms was abrupt starting 1 years ago with rapidlly worsening course since that time.The patient noted no past surgery on the left hip(s).  Patient currently rates pain in the left hip at 10 out of 10 with activity. Patient has night pain, worsening of pain with activity and weight bearing, trendelenberg gait, and pain that interfers with activities of daily living. Patient has evidence of joint space narrowing by imaging studies. This condition presents safety issues increasing the risk of falls.   There is no current active infection.  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   Body mass index (BMI) 28.0-28.9, adult 07/02/2020   Elevated blood-pressure reading, without diagnosis of hypertension  12/21/2019   Rheumatoid arthritis (HCC) 12/21/2019   Chronic left shoulder pain 10/12/2019   Pain in joint of right hip 06/08/2019   Cervical radiculopathy 10/30/2015   Difficulty speaking 12/06/2011   Paralysis of vocal cords 12/06/2011   Past Medical History:  Diagnosis Date   Allergy 2013   Arthritis 1980   Asthma 2000   Breast cancer (HCC) 09/22/2021   DCIS   Bruit    Per patient   Cataracts, both eyes 2021   surgery 2022   Colon polyps    Environmental allergies 2013   Fibromyalgia    GERD (gastroesophageal reflux disease) 2013   Glaucoma 2016   POAG OU   History of hiatal hernia    Hyperlipidemia 2014   Hypertensive retinopathy 2021   OU   Macular degeneration 2014   Neuropathy 2013   legs and arms   Osteopenia 2000   Osteoporosis    Paralyzed vocal cords 2013   Per patient   Tongue atrophy 2013   Per patient r/t paralyzed vocal cords   Vitamin B12 deficiency 2021   Vitamin D deficiency 2021    Past Surgical History:  Procedure Laterality Date   APPENDECTOMY  1981   with hysterectomy   BREAST LUMPECTOMY WITH RADIOACTIVE SEED LOCALIZATION Left 10/28/2021   Procedure: LEFT BREAST SEED LUMPECTOMY;  Surgeon: Harriette Bouillon, MD;  Location: [facility] OR;  Service: General;  Laterality: Left;   CATARACT EXTRACTION Bilateral    CERVICAL SPINE SURGERY  01/30/2020   Per patient   TOTAL ABDOMINAL HYSTERECTOMY  1981   UPPER GASTROINTESTINAL ENDOSCOPY  12/10/2022    No current facility-administered medications for this encounter.  Current Outpatient Medications  Medication Sig Dispense Refill Last Dose/Taking   acetaminophen (TYLENOL) 500 MG tablet Take 500 mg by mouth every 8 (eight) hours as needed for moderate pain (pain score 4-6).   Taking As Needed   brimonidine (ALPHAGAN) 0.2 % ophthalmic solution Place 1 drop into both eyes 2 (two) times daily.   Taking   Cholecalciferol (VITAMIN D) 50 MCG (2000 UT) tablet Take 4,000 Units by mouth daily.   Taking   fluticasone  (FLONASE) 50 MCG/ACT nasal spray SPRAY 2 SPRAYS INTO EACH NOSTRIL EVERY DAY (Patient taking differently: Place 1 spray into both nostrils daily as needed for allergies.) 48 mL 2 Taking Differently   gabapentin (NEURONTIN) 300 MG capsule Take 300 mg by mouth 3 (three) times daily.   Taking   moxifloxacin (VIGAMOX) 0.5 % ophthalmic solution Place 1 drop into both eyes 3 (three) times daily.   Taking   naproxen sodium (ALEVE) 220 MG tablet Take 440 mg by mouth at bedtime.   Taking   RHOPRESSA 0.02 % SOLN Place 1 drop into both eyes at bedtime.   Taking   traMADol (ULTRAM) 50 MG tablet Take 50 mg by mouth at bedtime.   Taking   vitamin B-12 (CYANOCOBALAMIN) 1000 MCG tablet Take 1,000 mcg by mouth daily.   Taking   WIXELA INHUB 250-50 MCG/ACT AEPB INHALE 1 PUFF INTO THE LUNGS IN THE MORNING AND AT BEDTIME. (Patient taking differently: Inhale 1 puff into the lungs at bedtime.) 180 each 3 Taking Differently   famotidine (PEPCID) 40 MG tablet Take 40 mg by mouth daily.      MIEBO 1.338 GM/ML SOLN Apply 1 drop to eye 4 (four) times daily.      Allergies  Allergen Reactions   Misc. Sulfonamide Containing Compounds Other (See Comments)   Sulfa Antibiotics     Wheezing, swelling face and ears    Social History   Tobacco Use   Smoking status: Former    Types: Cigarettes   Smokeless tobacco: Never   Tobacco comments:    College years  Substance Use Topics   Alcohol use: Yes    Alcohol/week: 2.0 standard drinks of alcohol    Types: 1 Glasses of wine, 1 Cans of beer per week    Comment: 1 per day    Family History  Problem Relation Age of Onset   Hypertension Mother    Lung cancer Father        he smoked   Glaucoma Father    Gout Father    Alzheimer's disease Father    Hypertension Sister    Liver disease Sister    Brain cancer Cousin        glioblastoma, paternal first cousin   Colon cancer Neg Hx    Stomach cancer Neg Hx      Review of Systems  Constitutional:  Negative for chills  and fever.  Respiratory:  Negative for cough and shortness of breath.   Cardiovascular:  Negative for chest pain.  Gastrointestinal:  Negative for nausea and vomiting.  Musculoskeletal:  Positive for arthralgias.     Objective:  Physical Exam Well nourished and well developed. General: Alert and oriented x3, cooperative and pleasant, no acute distress. Head: normocephalic, atraumatic, neck supple. Eyes: EOMI.  Musculoskeletal: Left hip exam: Based on the amount of pain she has been experiencing I deferred examination. No change in exam findings.  Calves soft and nontender. Motor function intact in LE. Strength 5/5 LE bilaterally. Neuro: Distal pulses 2+.  Sensation to light touch intact in LE.  Vital signs in last 24 hours:    Labs:   Estimated body mass index is 28.53 kg/m as calculated from the following:   Height as of 03/11/23: 5\' 1"  (1.549 m).   Weight as of 03/11/23: 68.5 kg.   Imaging Review Plain radiographs demonstrate severe degenerative joint disease of the left hip(s). The bone quality appears to be adequate for age and reported activity level.      Assessment/Plan:  End stage arthritis, left hip(s)  The patient history, physical examination, clinical judgement of the provider and imaging studies are consistent with end stage degenerative joint disease of the left hip(s) and total hip arthroplasty is deemed medically necessary. The treatment options including medical management, injection therapy, arthroscopy and arthroplasty were discussed at length. The risks and benefits of total hip arthroplasty were presented and reviewed. The risks due to aseptic loosening, infection, stiffness, dislocation/subluxation,  thromboembolic complications and other imponderables were discussed.  The patient acknowledged the explanation, agreed to proceed with the plan and consent was signed. Patient is being admitted for inpatient treatment for surgery, pain control, PT, OT,  prophylactic antibiotics, VTE prophylaxis, progressive ambulation and ADL's and discharge planning.The patient is planning to be discharged  home.   Rosalene Billings, PA-C Orthopedic Surgery [facility] [place] [phone]

## 2023-03-20 NOTE — Anesthesia Preprocedure Evaluation (Signed)
Anesthesia Evaluation  Patient identified by MRN, date of birth, ID band Patient awake    Reviewed: Allergy & Precautions, NPO status , Patient's Chart, lab work & pertinent test results  Airway Mallampati: II  TM Distance: >3 FB Neck ROM: Full    Dental no notable dental hx. (+) Teeth Intact, Dental Advisory Given   Pulmonary asthma , former smoker   Pulmonary exam normal breath sounds clear to auscultation       Cardiovascular Normal cardiovascular exam Rhythm:Regular Rate:Normal     Neuro/Psych  negative psych ROS   GI/Hepatic hiatal hernia,GERD  ,,  Endo/Other  negative endocrine ROS    Renal/GU      Musculoskeletal  (+) Arthritis , Rheumatoid disorders,  Fibromyalgia -  Abdominal   Peds  Hematology Lab Results      Component                Value               Date                      WBC                      8.5                 03/08/2023                HGB                      12.7                03/08/2023                HCT                      39.0                03/08/2023                MCV                      103.2 (H)           03/08/2023                PLT                      315.0               03/08/2023              Anesthesia Other Findings L breast CA  All: Sulfa   Reproductive/Obstetrics                             Anesthesia Physical Anesthesia Plan  ASA: 3  Anesthesia Plan: Spinal   Post-op Pain Management: Regional block* and Minimal or no pain anticipated   Induction:   PONV Risk Score and Plan: Treatment may vary due to age or medical condition, Propofol infusion and Ondansetron  Airway Management Planned: Natural Airway and Simple Face Mask  Additional Equipment: None  Intra-op Plan:   Post-operative Plan:   Informed Consent: I have reviewed the patients History and Physical, chart, labs and discussed the procedure including the risks,  benefits and alternatives for the proposed anesthesia with the patient or  authorized representative who has indicated his/her understanding and acceptance.     Dental advisory given  Plan Discussed with: CRNA and Anesthesiologist  Anesthesia Plan Comments:         Anesthesia Quick Evaluation

## 2023-03-21 ENCOUNTER — Other Ambulatory Visit: Payer: Self-pay

## 2023-03-21 ENCOUNTER — Observation Stay (HOSPITAL_COMMUNITY): Payer: Medicare Other

## 2023-03-21 ENCOUNTER — Ambulatory Visit (HOSPITAL_COMMUNITY): Payer: Medicare Other

## 2023-03-21 ENCOUNTER — Ambulatory Visit (HOSPITAL_COMMUNITY): Payer: Self-pay | Admitting: Anesthesiology

## 2023-03-21 ENCOUNTER — Encounter (HOSPITAL_COMMUNITY): Payer: Self-pay | Admitting: Orthopedic Surgery

## 2023-03-21 ENCOUNTER — Ambulatory Visit (HOSPITAL_BASED_OUTPATIENT_CLINIC_OR_DEPARTMENT_OTHER): Payer: Medicare Other | Admitting: Anesthesiology

## 2023-03-21 ENCOUNTER — Observation Stay (HOSPITAL_COMMUNITY)
Admission: RE | Admit: 2023-03-21 | Discharge: 2023-03-22 | Disposition: A | Discharge status: Home / Self Care | Payer: Medicare Other | Source: Ambulatory Visit | Attending: Orthopedic Surgery | Admitting: Orthopedic Surgery

## 2023-03-21 ENCOUNTER — Encounter (HOSPITAL_COMMUNITY)
Admission: RE | Disposition: A | Discharge status: Home / Self Care | Payer: Self-pay | Source: Ambulatory Visit | Attending: Orthopedic Surgery

## 2023-03-21 DIAGNOSIS — Z87891 Personal history of nicotine dependence: Secondary | ICD-10-CM

## 2023-03-21 DIAGNOSIS — J45909 Unspecified asthma, uncomplicated: Secondary | ICD-10-CM

## 2023-03-21 DIAGNOSIS — Z79899 Other long term (current) drug therapy: Secondary | ICD-10-CM | POA: Insufficient documentation

## 2023-03-21 DIAGNOSIS — M1612 Unilateral primary osteoarthritis, left hip: Secondary | ICD-10-CM

## 2023-03-21 DIAGNOSIS — Z853 Personal history of malignant neoplasm of breast: Secondary | ICD-10-CM | POA: Insufficient documentation

## 2023-03-21 DIAGNOSIS — I10 Essential (primary) hypertension: Secondary | ICD-10-CM | POA: Insufficient documentation

## 2023-03-21 DIAGNOSIS — M1611 Unilateral primary osteoarthritis, right hip: Secondary | ICD-10-CM | POA: Diagnosis not present

## 2023-03-21 DIAGNOSIS — Z96642 Presence of left artificial hip joint: Secondary | ICD-10-CM | POA: Diagnosis not present

## 2023-03-21 HISTORY — PX: TOTAL HIP ARTHROPLASTY: SHX124

## 2023-03-21 LAB — ABO/RH: ABO/RH(D): O POS

## 2023-03-21 LAB — TYPE AND SCREEN
ABO/RH(D): O POS
Antibody Screen: NEGATIVE

## 2023-03-21 SURGERY — ARTHROPLASTY, HIP, TOTAL, ANTERIOR APPROACH
Anesthesia: Spinal | Site: Hip | Laterality: Left

## 2023-03-21 MED ORDER — TRAMADOL HCL 50 MG PO TABS
50.0000 mg | ORAL_TABLET | Freq: Four times a day (QID) | ORAL | Status: DC | PRN
  Administered 2023-03-21 – 2023-03-22 (×2): 50 mg via ORAL
  Filled 2023-03-21: qty 2
  Filled 2023-03-21 (×2): qty 1

## 2023-03-21 MED ORDER — FENTANYL CITRATE (PF) 100 MCG/2ML IJ SOLN
INTRAMUSCULAR | Status: DC | PRN
  Administered 2023-03-21: 50 ug via INTRAVENOUS

## 2023-03-21 MED ORDER — METHOCARBAMOL 1000 MG/10ML IJ SOLN: 500.0000 mg | Freq: Four times a day (QID) | INTRAMUSCULAR | Status: DC | PRN

## 2023-03-21 MED ORDER — ONDANSETRON HCL 4 MG/2ML IJ SOLN: 4.0000 mg | Freq: Once | INTRAMUSCULAR | Status: DC | PRN

## 2023-03-21 MED ORDER — TRANEXAMIC ACID-NACL 1000-0.7 MG/100ML-% IV SOLN
1000.0000 mg | Freq: Once | INTRAVENOUS | Status: AC
  Administered 2023-03-21: 1000 mg via INTRAVENOUS
  Filled 2023-03-21: qty 100

## 2023-03-21 MED ORDER — SODIUM CHLORIDE (PF) 0.9 % IJ SOLN
INTRAMUSCULAR | Status: DC | PRN
  Administered 2023-03-21: 61 mL

## 2023-03-21 MED ORDER — KETOROLAC TROMETHAMINE 30 MG/ML IJ SOLN
INTRAMUSCULAR | Status: AC
  Filled 2023-03-21: qty 1

## 2023-03-21 MED ORDER — BUPIVACAINE-EPINEPHRINE 0.25% -1:200000 IJ SOLN
INTRAMUSCULAR | Status: AC
  Filled 2023-03-21: qty 1

## 2023-03-21 MED ORDER — POVIDONE-IODINE 10 % EX SWAB: 2.0000 | Freq: Once | CUTANEOUS | Status: DC

## 2023-03-21 MED ORDER — BISACODYL 10 MG RE SUPP: 10.0000 mg | Freq: Every day | RECTAL | Status: DC | PRN

## 2023-03-21 MED ORDER — ORAL CARE MOUTH RINSE: 15.0000 mL | Freq: Once | OROMUCOSAL | Status: AC

## 2023-03-21 MED ORDER — METOCLOPRAMIDE HCL 5 MG/ML IJ SOLN: 5.0000 mg | Freq: Three times a day (TID) | INTRAMUSCULAR | Status: DC | PRN

## 2023-03-21 MED ORDER — ONDANSETRON HCL 4 MG/2ML IJ SOLN: 4.0000 mg | Freq: Four times a day (QID) | INTRAMUSCULAR | Status: DC | PRN

## 2023-03-21 MED ORDER — LACTATED RINGERS IV SOLN: INTRAVENOUS | Status: DC

## 2023-03-21 MED ORDER — ALUM & MAG HYDROXIDE-SIMETH 200-200-20 MG/5ML PO SUSP: 30.0000 mL | ORAL | Status: DC | PRN

## 2023-03-21 MED ORDER — FENTANYL CITRATE PF 50 MCG/ML IJ SOSY: 25.0000 ug | PREFILLED_SYRINGE | INTRAMUSCULAR | Status: DC | PRN

## 2023-03-21 MED ORDER — BUPIVACAINE IN DEXTROSE 0.75-8.25 % IT SOLN
INTRATHECAL | Status: DC | PRN
  Administered 2023-03-21: 12 mg via INTRATHECAL

## 2023-03-21 MED ORDER — POLYETHYLENE GLYCOL 3350 17 G PO PACK
17.0000 g | PACK | Freq: Two times a day (BID) | ORAL | Status: DC
  Administered 2023-03-21: 17 g via ORAL
  Filled 2023-03-21 (×3): qty 1

## 2023-03-21 MED ORDER — SENNA 8.6 MG PO TABS
2.0000 | ORAL_TABLET | Freq: Every day | ORAL | Status: DC
  Administered 2023-03-21: 17.2 mg via ORAL
  Filled 2023-03-21: qty 2

## 2023-03-21 MED ORDER — OXYCODONE HCL 5 MG PO TABS
5.0000 mg | ORAL_TABLET | ORAL | Status: DC | PRN
Start: 2023-03-21 — End: 2023-03-22

## 2023-03-21 MED ORDER — METOCLOPRAMIDE HCL 5 MG PO TABS: 5.0000 mg | ORAL_TABLET | Freq: Three times a day (TID) | ORAL | Status: DC | PRN

## 2023-03-21 MED ORDER — ONDANSETRON HCL 4 MG PO TABS: 4.0000 mg | ORAL_TABLET | Freq: Four times a day (QID) | ORAL | Status: DC | PRN

## 2023-03-21 MED ORDER — NETARSUDIL DIMESYLATE 0.02 % OP SOLN: 1.0000 [drp] | Freq: Every day | OPHTHALMIC | Status: DC

## 2023-03-21 MED ORDER — ASPIRIN 81 MG PO CHEW
81.0000 mg | CHEWABLE_TABLET | Freq: Two times a day (BID) | ORAL | Status: DC
  Administered 2023-03-21 – 2023-03-22 (×2): 81 mg via ORAL
  Filled 2023-03-21 (×2): qty 1

## 2023-03-21 MED ORDER — ONDANSETRON HCL 4 MG/2ML IJ SOLN
INTRAMUSCULAR | Status: AC
Start: 2023-03-21 — End: ?
  Filled 2023-03-21: qty 2

## 2023-03-21 MED ORDER — DEXAMETHASONE SODIUM PHOSPHATE 10 MG/ML IJ SOLN
10.0000 mg | Freq: Once | INTRAMUSCULAR | Status: DC
  Filled 2023-03-21: qty 1

## 2023-03-21 MED ORDER — EPHEDRINE SULFATE (PRESSORS) 50 MG/ML IJ SOLN
INTRAMUSCULAR | Status: DC | PRN
  Administered 2023-03-21 (×5): 5 mg via INTRAVENOUS

## 2023-03-21 MED ORDER — PHENOL 1.4 % MT LIQD: 1.0000 | OROMUCOSAL | Status: DC | PRN

## 2023-03-21 MED ORDER — PROPOFOL 500 MG/50ML IV EMUL
INTRAVENOUS | Status: AC
  Filled 2023-03-21: qty 50

## 2023-03-21 MED ORDER — CEFAZOLIN SODIUM-DEXTROSE 2-4 GM/100ML-% IV SOLN
2.0000 g | INTRAVENOUS | Status: AC
  Administered 2023-03-21: 2 g via INTRAVENOUS
  Filled 2023-03-21: qty 100

## 2023-03-21 MED ORDER — EPHEDRINE 5 MG/ML INJ
INTRAVENOUS | Status: AC
  Filled 2023-03-21: qty 5

## 2023-03-21 MED ORDER — SODIUM CHLORIDE 0.9% FLUSH
3.0000 mL | INTRAVENOUS | Status: DC | PRN
Start: 2023-03-21 — End: 2023-03-22

## 2023-03-21 MED ORDER — CHLORHEXIDINE GLUCONATE 0.12 % MT SOLN
15.0000 mL | Freq: Once | OROMUCOSAL | Status: AC
  Administered 2023-03-21: 15 mL via OROMUCOSAL

## 2023-03-21 MED ORDER — PROPOFOL 500 MG/50ML IV EMUL
INTRAVENOUS | Status: DC | PRN
  Administered 2023-03-21: 25 ug/kg/min via INTRAVENOUS

## 2023-03-21 MED ORDER — PHENYLEPHRINE 80 MCG/ML (10ML) SYRINGE FOR IV PUSH (FOR BLOOD PRESSURE SUPPORT)
PREFILLED_SYRINGE | INTRAVENOUS | Status: AC
  Filled 2023-03-21: qty 10

## 2023-03-21 MED ORDER — FLUTICASONE PROPIONATE 50 MCG/ACT NA SUSP: 1.0000 | Freq: Every day | NASAL | Status: DC | PRN

## 2023-03-21 MED ORDER — TRANEXAMIC ACID-NACL 1000-0.7 MG/100ML-% IV SOLN
1000.0000 mg | INTRAVENOUS | Status: AC
  Administered 2023-03-21: 1000 mg via INTRAVENOUS
  Filled 2023-03-21: qty 100

## 2023-03-21 MED ORDER — PHENYLEPHRINE HCL (PRESSORS) 10 MG/ML IV SOLN
INTRAVENOUS | Status: DC | PRN
  Administered 2023-03-21 (×7): 80 ug via INTRAVENOUS
  Administered 2023-03-21: 100 ug via INTRAVENOUS
  Administered 2023-03-21: 40 ug via INTRAVENOUS
  Administered 2023-03-21: 80 ug via INTRAVENOUS

## 2023-03-21 MED ORDER — GABAPENTIN 300 MG PO CAPS
300.0000 mg | ORAL_CAPSULE | Freq: Three times a day (TID) | ORAL | Status: DC
  Administered 2023-03-21 – 2023-03-22 (×3): 300 mg via ORAL
  Filled 2023-03-21 (×3): qty 1

## 2023-03-21 MED ORDER — 0.9 % SODIUM CHLORIDE (POUR BTL) OPTIME
TOPICAL | Status: DC | PRN
  Administered 2023-03-21: 1000 mL

## 2023-03-21 MED ORDER — CEFAZOLIN SODIUM-DEXTROSE 2-4 GM/100ML-% IV SOLN
2.0000 g | Freq: Four times a day (QID) | INTRAVENOUS | Status: AC
  Administered 2023-03-21 (×2): 2 g via INTRAVENOUS
  Filled 2023-03-21 (×2): qty 100

## 2023-03-21 MED ORDER — MIDAZOLAM HCL 2 MG/2ML IJ SOLN
INTRAMUSCULAR | Status: AC
  Filled 2023-03-21: qty 2

## 2023-03-21 MED ORDER — METHOCARBAMOL 500 MG PO TABS: 500.0000 mg | ORAL_TABLET | Freq: Four times a day (QID) | ORAL | Status: DC | PRN

## 2023-03-21 MED ORDER — DIPHENHYDRAMINE HCL 12.5 MG/5ML PO ELIX: 12.5000 mg | ORAL_SOLUTION | ORAL | Status: DC | PRN

## 2023-03-21 MED ORDER — DEXAMETHASONE SODIUM PHOSPHATE 10 MG/ML IJ SOLN
8.0000 mg | Freq: Once | INTRAMUSCULAR | Status: AC
  Administered 2023-03-21: 10 mg via INTRAVENOUS

## 2023-03-21 MED ORDER — MOMETASONE FURO-FORMOTEROL FUM 200-5 MCG/ACT IN AERO
2.0000 | INHALATION_SPRAY | Freq: Two times a day (BID) | RESPIRATORY_TRACT | Status: DC
  Filled 2023-03-21: qty 8.8

## 2023-03-21 MED ORDER — ACETAMINOPHEN 500 MG PO TABS
1000.0000 mg | ORAL_TABLET | Freq: Four times a day (QID) | ORAL | Status: DC
  Administered 2023-03-21 – 2023-03-22 (×5): 1000 mg via ORAL
  Filled 2023-03-21 (×5): qty 2

## 2023-03-21 MED ORDER — MENTHOL 3 MG MT LOZG
1.0000 | LOZENGE | OROMUCOSAL | Status: DC | PRN
Start: 2023-03-21 — End: 2023-03-22

## 2023-03-21 MED ORDER — MIDAZOLAM HCL 5 MG/5ML IJ SOLN
INTRAMUSCULAR | Status: DC | PRN
  Administered 2023-03-21: 1 mg via INTRAVENOUS

## 2023-03-21 MED ORDER — HYDROMORPHONE HCL 1 MG/ML IJ SOLN: 0.5000 mg | INTRAMUSCULAR | Status: DC | PRN

## 2023-03-21 MED ORDER — SODIUM CHLORIDE 0.9% FLUSH
3.0000 mL | Freq: Two times a day (BID) | INTRAVENOUS | Status: DC
  Administered 2023-03-22: 3 mL via INTRAVENOUS

## 2023-03-21 MED ORDER — DEXAMETHASONE SODIUM PHOSPHATE 10 MG/ML IJ SOLN
INTRAMUSCULAR | Status: AC
  Filled 2023-03-21: qty 1

## 2023-03-21 MED ORDER — ONDANSETRON HCL 4 MG/2ML IJ SOLN
INTRAMUSCULAR | Status: DC | PRN
  Administered 2023-03-21: 4 mg via INTRAVENOUS

## 2023-03-21 MED ORDER — ACETAMINOPHEN 10 MG/ML IV SOLN: 1000.0000 mg | Freq: Once | INTRAVENOUS | Status: DC | PRN

## 2023-03-21 MED ORDER — FENTANYL CITRATE (PF) 100 MCG/2ML IJ SOLN
INTRAMUSCULAR | Status: AC
  Filled 2023-03-21: qty 2

## 2023-03-21 MED ORDER — SODIUM CHLORIDE (PF) 0.9 % IJ SOLN
INTRAMUSCULAR | Status: AC
  Filled 2023-03-21: qty 30

## 2023-03-21 SURGICAL SUPPLY — 40 items
BAG COUNTER SPONGE SURGICOUNT (BAG) IMPLANT
BAG ZIPLOCK 12X15 (MISCELLANEOUS) IMPLANT
BLADE SAG 18X100X1.27 (BLADE) ×1 IMPLANT
COVER PERINEAL POST (MISCELLANEOUS) ×1 IMPLANT
COVER SURGICAL LIGHT HANDLE (MISCELLANEOUS) ×1 IMPLANT
CUP ACET PINNACLE SECTR 50MM (Hips) IMPLANT
DERMABOND ADVANCED .7 DNX12 (GAUZE/BANDAGES/DRESSINGS) ×1 IMPLANT
DRAPE FOOT SWITCH (DRAPES) ×1 IMPLANT
DRAPE STERI IOBAN 125X83 (DRAPES) ×1 IMPLANT
DRAPE U-SHAPE 47X51 STRL (DRAPES) ×2 IMPLANT
DRESSING AQUACEL AG SP 3.5X10 (GAUZE/BANDAGES/DRESSINGS) ×1 IMPLANT
DRSG AQUACEL AG SP 3.5X10 (GAUZE/BANDAGES/DRESSINGS) ×1
DURAPREP 26ML APPLICATOR (WOUND CARE) ×1 IMPLANT
ELECT REM PT RETURN 15FT ADLT (MISCELLANEOUS) ×1 IMPLANT
GLOVE BIO SURGEON STRL SZ 6 (GLOVE) ×1 IMPLANT
GLOVE BIOGEL PI IND STRL 6.5 (GLOVE) ×1 IMPLANT
GLOVE BIOGEL PI IND STRL 7.5 (GLOVE) ×1 IMPLANT
GLOVE ORTHO TXT STRL SZ7.5 (GLOVE) ×2 IMPLANT
GOWN STRL REUS W/ TWL LRG LVL3 (GOWN DISPOSABLE) ×2 IMPLANT
HEAD FEM STD 32X+5 STRL (Hips) IMPLANT
HOLDER FOLEY CATH W/STRAP (MISCELLANEOUS) ×1 IMPLANT
KIT TURNOVER KIT A (KITS) IMPLANT
LINER ACET PNNCL PLUS4 NEUTRAL (Hips) IMPLANT
MANIFOLD NEPTUNE II (INSTRUMENTS) ×1 IMPLANT
NDL SAFETY ECLIPSE 18X1.5 (NEEDLE) IMPLANT
PACK ANTERIOR HIP CUSTOM (KITS) ×1 IMPLANT
PINNACLE PLUS 4 NEUTRAL (Hips) ×1 IMPLANT
PINNACLE SECTOR CUP 50MM (Hips) ×1 IMPLANT
SCREW 6.5MMX40MM (Screw) IMPLANT
STEM FEMORAL SZ6 HIGH ACTIS (Stem) IMPLANT
SUT MNCRL AB 4-0 PS2 18 (SUTURE) ×1 IMPLANT
SUT STRATAFIX 0 PDS 27 VIOLET (SUTURE) ×1
SUT VIC AB 1 CT1 36 (SUTURE) ×3 IMPLANT
SUT VIC AB 2-0 CT1 TAPERPNT 27 (SUTURE) ×2 IMPLANT
SUTURE STRATFX 0 PDS 27 VIOLET (SUTURE) ×1 IMPLANT
SYR 3ML LL SCALE MARK (SYRINGE) IMPLANT
TOWEL GREEN STERILE FF (TOWEL DISPOSABLE) ×1 IMPLANT
TRAY FOLEY MTR SLVR 16FR STAT (SET/KITS/TRAYS/PACK) ×1 IMPLANT
TUBE SUCTION HIGH CAP CLEAR NV (SUCTIONS) ×1 IMPLANT
WATER STERILE IRR 1000ML POUR (IV SOLUTION) ×1 IMPLANT

## 2023-03-21 NOTE — Anesthesia Procedure Notes (Signed)
Spinal  Patient location during procedure: OR Start time: 03/21/2023 7:18 AM End time: 03/21/2023 7:24 AM Reason for block: surgical anesthesia Staffing Performed: anesthesiologist  Anesthesiologist: Trevor Iha, MD Performed by: Trevor Iha, MD Authorized by: Trevor Iha, MD   Preanesthetic Checklist Completed: patient identified, IV checked, risks and benefits discussed, surgical consent, monitors and equipment checked, pre-op evaluation and timeout performed Spinal Block Patient position: sitting Prep: DuraPrep and site prepped and draped Patient monitoring: heart rate, cardiac monitor, continuous pulse ox and blood pressure Approach: midline Location: L3-4 Injection technique: single-shot Needle Needle type: Pencan  Needle gauge: 24 G Needle length: 10 cm Needle insertion depth: 7 cm Assessment Sensory level: T4 Events: CSF return Additional Notes 2 Attempt (s). Pt tolerated procedure well.

## 2023-03-21 NOTE — Discharge Instructions (Signed)

## 2023-03-21 NOTE — Evaluation (Signed)
Physical Therapy Evaluation Patient Details Name: Ruth Wolfe MRN: [id number] DOB: 1945-10-08 Today's Date: 03/21/2023  History of Present Illness  78 y.o. female admitted 03/21/23 for L AA-THA. PMH: breast cancer s/p lumpectomy, peripheral neuropathy, RA, vocal cord paralysis, fibromyalgia.  Clinical Impression  Pt is s/p THA resulting in the deficits listed below (see PT Problem List). Pt ambulated 72' with RW, no loss of balance. Initiated THA HEP. Good progress expected, pt puts forth good effort.  Pt will benefit from acute skilled PT to increase their independence and safety with mobility to facilitate discharge.          If plan is discharge home, recommend the following: A little help with walking and/or transfers;A little help with bathing/dressing/bathroom;Assistance with cooking/housework;Assist for transportation;Help with stairs or ramp for entrance   Can travel by private vehicle        Equipment Recommendations None recommended by PT  Recommendations for Other Services       Functional Status Assessment Patient has had a recent decline in their functional status and demonstrates the ability to make significant improvements in function in a reasonable and predictable amount of time.     Precautions / Restrictions Precautions Precautions: Fall Restrictions Weight Bearing Restrictions Per Provider Order: No Other Position/Activity Restrictions: WBAT LLE      Mobility  Bed Mobility Overal bed mobility: Needs Assistance Bed Mobility: Supine to Sit     Supine to sit: Min assist, HOB elevated, Used rails     General bed mobility comments: assist to raise trunk and pivot hips to edge of bed    Transfers Overall transfer level: Needs assistance Equipment used: Rolling walker (2 wheels) Transfers: Sit to/from Stand Sit to Stand: Min assist           General transfer comment: VCs hand placement    Ambulation/Gait Ambulation/Gait assistance: Contact  guard assist Gait Distance (Feet): 75 Feet Assistive device: Rolling walker (2 wheels) Gait Pattern/deviations: Step-to pattern, Decreased step length - right, Decreased step length - left Gait velocity: decr     General Gait Details: VCs sequencing, no loss of balance  Stairs            Wheelchair Mobility     Tilt Bed    Modified Rankin (Stroke Patients Only)       Balance Overall balance assessment: Modified Independent                                           Pertinent Vitals/Pain Pain Assessment Pain Assessment: 0-10 Pain Score: 4  Pain Location: L hip Pain Descriptors / Indicators: Sore Pain Intervention(s): Limited activity within patient's tolerance, Monitored during session, Premedicated before session, Ice applied, Repositioned    Home Living Family/patient expects to be discharged to:: Private residence Living Arrangements: Alone Available Help at Discharge: Other (Comment) Type of Home: House Home Access: Stairs to enter   Entrance Stairs-Number of Steps: 2   Home Layout: One level Home Equipment: Agricultural consultant (2 wheels);Cane - single point Additional Comments: friend [place] with assist 24/7    Prior Function Prior Level of Function : Independent/Modified Independent             Mobility Comments: walked with cane ADLs Comments: independent     Extremity/Trunk Assessment   Upper Extremity Assessment Upper Extremity Assessment: Overall WFL for tasks assessed    Lower Extremity Assessment  Lower Extremity Assessment: LLE deficits/detail LLE Deficits / Details: knee ext -3/5, hip flexion AAROM ~30* limited by pain LLE Sensation: WNL LLE Coordination: WNL    Cervical / Trunk Assessment Cervical / Trunk Assessment: Normal  Communication   Communication Communication: No apparent difficulties  Cognition Arousal: Alert Behavior During Therapy: WFL for tasks assessed/performed Overall Cognitive Status: Within  Functional Limits for tasks assessed                                          General Comments      Exercises Total Joint Exercises Ankle Circles/Pumps: AROM, Both, 20 reps, Supine Heel Slides: AAROM, Left, 10 reps, Supine Hip ABduction/ADduction: AAROM, Left, Supine, 5 reps Long Arc Quad: AROM, Left, 5 reps, Seated   Assessment/Plan    PT Assessment Patient needs continued PT services  PT Problem List Decreased activity tolerance;Decreased range of motion;Decreased strength;Decreased mobility;Pain       PT Treatment Interventions DME instruction;Gait training;Therapeutic exercise;Therapeutic activities;Patient/family education    PT Goals (Current goals can be found in the Care Plan section)  Acute Rehab PT Goals Patient Stated Goal: return to exercise class, go out for ice cream PT Goal Formulation: With patient Time For Goal Achievement: 03/28/23 Potential to Achieve Goals: Good    Frequency 7X/week     Co-evaluation               AM-PAC PT "6 Clicks" Mobility  Outcome Measure Help needed turning from your back to your side while in a flat bed without using bedrails?: A Little Help needed moving from lying on your back to sitting on the side of a flat bed without using bedrails?: A Little Help needed moving to and from a bed to a chair (including a wheelchair)?: A Little Help needed standing up from a chair using your arms (e.g., wheelchair or bedside chair)?: A Little Help needed to walk in hospital room?: A Little Help needed climbing 3-5 steps with a railing? : A Lot 6 Click Score: 17    End of Session Equipment Utilized During Treatment: Gait belt Activity Tolerance: Patient tolerated treatment well Patient left: in chair;with call bell/phone within reach;with chair alarm set;with family/visitor present Nurse Communication: Mobility status PT Visit Diagnosis: Difficulty in walking, not elsewhere classified (R26.2);Pain Pain -  Right/Left: Left Pain - part of body: Hip    Time: 1151-1227 PT Time Calculation (min) (ACUTE ONLY): 36 min   Charges:   PT Evaluation $PT Eval Moderate Complexity: 1 Mod PT Treatments $Gait Training: 8-22 mins PT General Charges $$ ACUTE PT VISIT: 1 Visit         Tamala Ser PT 03/21/2023  Acute Rehabilitation Services  Office [phone]

## 2023-03-21 NOTE — Op Note (Signed)
NAME:  Ruth Wolfe NO.: [id number]      MEDICAL RECORD NO.: [id number]      FACILITY:  [facility]      PHYSICIAN:  Shelda Pal  DATE OF BIRTH:  [birth year]     DATE OF PROCEDURE:  03/21/2023                                 OPERATIVE REPORT         PREOPERATIVE DIAGNOSIS: Left  hip osteoarthritis.      POSTOPERATIVE DIAGNOSIS:  Left hip osteoarthritis.      PROCEDURE:  Left total hip replacement through an anterior approach   utilizing DePuy THR system, component size 50 mm pinnacle cup, a size 32+4 neutral   Altrex liner, a size 6 Hi Actis stem with a 32+5 Articuleze metal head ball.      SURGEON:  Madlyn Frankel. Charlann Boxer, M.D.      ASSISTANT:  Rosalene Billings, PA-C     ANESTHESIA:  Spinal.      SPECIMENS:  None.      COMPLICATIONS:  None.      BLOOD LOSS:  400 cc     DRAINS:  None.      INDICATION OF THE PROCEDURE:  Ruth Wolfe is a 78 y.o. female who had   presented to office for evaluation of left hip pain.  Radiographs revealed   progressive degenerative changes with bone-on-bone   articulation of the  hip joint, including subchondral cystic changes and osteophytes.  The patient had painful limited range of   motion significantly affecting their overall quality of life and function.  The patient was failing to    respond to conservative measures including medications and/or injections and activity modification and at this point was ready   to proceed with more definitive measures.  Consent was obtained for   benefit of pain relief.  Specific risks of infection, DVT, component   failure, dislocation, neurovascular injury, and need for revision surgery were reviewed in the office.     PROCEDURE IN DETAIL:  The patient was brought to operative theater.   Once adequate anesthesia, preoperative antibiotics, 2 gm of Ancef, 1 gm of Tranexamic Acid, and 10 mg of Decadron were administered, the patient was positioned supine on the [employer] table.  Once the patient was safely positioned with adequate padding of boney prominences we predraped out the hip, and used fluoroscopy to confirm orientation of the pelvis.      The left hip was then prepped and draped from proximal iliac crest to   mid thigh with a shower curtain technique.      Time-out was performed identifying the patient, planned procedure, and the appropriate extremity.     An incision was then made 2 cm lateral to the   anterior superior iliac spine extending over the orientation of the   tensor fascia lata muscle and sharp dissection was carried down to the   fascia of the muscle.      The fascia was then incised.  The muscle belly was identified and swept   laterally and retractor placed along the superior neck.  Following   cauterization of the circumflex vessels and removing some pericapsular   fat, a second cobra retractor was placed on the inferior neck.  A T-capsulotomy was made along the line of the   superior neck to the trochanteric fossa, then extended proximally and   distally.  Tag sutures were placed and the retractors were then placed   intracapsular.  We then identified the trochanteric fossa and   orientation of my neck cut and then made a neck osteotomy with the femur on traction.  The femoral   head was removed without difficulty or complication.  Traction was let   off and retractors were placed posterior and anterior around the   acetabulum.      The labrum and foveal tissue were debrided.  I began reaming with a 45 mm   reamer and reamed up to 49 mm reamer with good bony bed preparation and a 50 mm  cup was chosen.  The final 50 mm Pinnacle cup was then impacted under fluoroscopy to confirm the depth of penetration and orientation with respect to   Abduction and forward flexion.  A screw was placed into the ilium followed by the hole eliminator.  The final   32+4 neutral Altrex liner was impacted with good visualized rim fit.  The cup  was positioned anatomically within the acetabular portion of the pelvis.      At this point, the femur was rolled to 100 degrees.  Further capsule was   released off the inferior aspect of the femoral neck.  I then   released the superior capsule proximally.  With the leg in a neutral position the hook was placed laterally   along the femur under the vastus lateralis origin and elevated manually and then held in position using the hook attachment on the bed.  The leg was then extended and adducted with the leg rolled to 100   degrees of external rotation.  Retractors were placed along the medial calcar and posteriorly over the greater trochanter.  Once the proximal femur was fully   exposed, I used a box osteotome to set orientation.  I then began   broaching with the starting chili pepper broach and passed this by hand and then broached up to 6.  With the 6 broach in place I chose a high offset neck and did several trial reductions.  The offset was appropriate, leg lengths   appeared to be equal best matched with the +5 head ball trial confirmed radiographically.   Given these findings, I went ahead and dislocated the hip, repositioned all   retractors and positioned the right hip in the extended and abducted position.  The final 6 Hi Actis stem was   chosen and it was impacted down to the level of neck cut.  Based on this   and the trial reductions, a final 32+5 Articuleze metal head ball was chosen and   impacted onto a clean and dry trunnion, and the hip was reduced.  The   hip had been irrigated throughout the case again at this point.  I did   reapproximate the superior capsular leaflet to the anterior leaflet   using #1 Vicryl.  The fascia of the   tensor fascia lata muscle was then reapproximated using #1 Vicryl and #0 Stratafix sutures.  The   remaining wound was closed with 2-0 Vicryl and running 4-0 Monocryl.   The hip was cleaned, dried, and dressed sterilely using Dermabond and    Aquacel dressing.  The patient was then brought   to recovery room in stable condition tolerating the procedure well.    Rosalene Billings,  PA-C was present for the entirety of the case involved from   preoperative positioning, perioperative retractor management, general   facilitation of the case, as well as primary wound closure as assistant.            Madlyn Frankel Charlann Boxer, M.D.        03/21/2023 7:15 AM

## 2023-03-21 NOTE — Anesthesia Postprocedure Evaluation (Signed)
Anesthesia Post Note  Patient: Ruth Wolfe  Procedure(s) Performed: TOTAL HIP ARTHROPLASTY ANTERIOR APPROACH (Left: Hip)     Patient location during evaluation: Nursing Unit Anesthesia Type: Spinal Level of consciousness: oriented and awake and alert Pain management: pain level controlled Vital Signs Assessment: post-procedure vital signs reviewed and stable Respiratory status: spontaneous breathing and respiratory function stable Cardiovascular status: blood pressure returned to baseline and stable Postop Assessment: no headache, no backache, no apparent nausea or vomiting and patient able to bend at knees Anesthetic complications: no   No notable events documented.  Last Vitals:  Vitals:   03/21/23 1000 03/21/23 1025  BP: 121/67 122/61  Pulse: 91 90  Resp: 16 16  Temp: (!) 36.3 C (!) 36.4 C  SpO2: 100% 99%    Last Pain:  Vitals:   03/21/23 1042  TempSrc:   PainSc: 4                  Trevor Iha

## 2023-03-21 NOTE — Care Plan (Signed)
Ortho Bundle Case Management Note  Patient Details  Name: Ruth Wolfe MRN: [id number] Date of Birth: [birth year]  L THA on 03-21-23 DCP:  Home with friend and neighbor DME:  No needs PT:  HEP                   DME Arranged:  N/A DME Agency:  NA  HH Arranged:  NA HH Agency:  NA  Additional Comments: Please contact me with any questions of if this plan should need to change.  Despina Pole, CCM, [place], [phone] 03/21/2023, 5:50 PM

## 2023-03-21 NOTE — Plan of Care (Signed)
  Problem: Education: Goal: Knowledge of General Education information will improve Description: Including pain rating scale, medication(s)/side effects and non-pharmacologic comfort measures Outcome: Progressing   Problem: Health Behavior/Discharge Planning: Goal: Ability to manage health-related needs will improve Outcome: Progressing   Problem: Clinical Measurements: Goal: Ability to maintain clinical measurements within normal limits will improve Outcome: Progressing Goal: Will remain free from infection Outcome: Progressing Goal: Diagnostic test results will improve Outcome: Progressing Goal: Respiratory complications will improve Outcome: Progressing Goal: Cardiovascular complication will be avoided Outcome: Progressing   Problem: Activity: Goal: Risk for activity intolerance will decrease Outcome: Adequate for Discharge   Problem: Nutrition: Goal: Adequate nutrition will be maintained Outcome: Completed/Met   Problem: Coping: Goal: Level of anxiety will decrease Outcome: Progressing   Problem: Elimination: Goal: Will not experience complications related to bowel motility Outcome: Progressing Goal: Will not experience complications related to urinary retention Outcome: Progressing   Problem: Pain Managment: Goal: General experience of comfort will improve and/or be controlled Outcome: Adequate for Discharge   Problem: Safety: Goal: Ability to remain free from injury will improve Outcome: Progressing   Problem: Skin Integrity: Goal: Risk for impaired skin integrity will decrease Outcome: Adequate for Discharge   Problem: Education: Goal: Knowledge of the prescribed therapeutic regimen will improve Outcome: Progressing Goal: Understanding of discharge needs will improve Outcome: Progressing Goal: Individualized Educational Video(s) Outcome: Completed/Met   Problem: Activity: Goal: Ability to avoid complications of mobility impairment will  improve Outcome: Adequate for Discharge Goal: Ability to tolerate increased activity will improve Outcome: Adequate for Discharge   Problem: Clinical Measurements: Goal: Postoperative complications will be avoided or minimized Outcome: Progressing   Problem: Pain Management: Goal: Pain level will decrease with appropriate interventions Outcome: Adequate for Discharge   Problem: Skin Integrity: Goal: Will show signs of wound healing Outcome: Progressing

## 2023-03-21 NOTE — Transfer of Care (Signed)
Immediate Anesthesia Transfer of Care Note  Patient: Ruth Wolfe  Procedure(s) Performed: TOTAL HIP ARTHROPLASTY ANTERIOR APPROACH (Left: Hip)  Patient Location: PACU  Anesthesia Type:Spinal  Level of Consciousness: awake, alert , and oriented  Airway & Oxygen Therapy: Patient Spontanous Breathing and Patient connected to face mask oxygen  Post-op Assessment: Report given to RN and Post -op Vital signs reviewed and stable  Post vital signs: Reviewed and stable  Last Vitals:  Vitals Value Taken Time  BP 96/80 03/21/23 0900  Temp 36.4 C 03/21/23 0856  Pulse 90 03/21/23 0904  Resp 26 03/21/23 0904  SpO2 100 % 03/21/23 0904  Vitals shown include unfiled device data.  Last Pain:  Vitals:   03/21/23 0856  TempSrc:   PainSc: 0-No pain         Complications: No notable events documented.

## 2023-03-22 ENCOUNTER — Encounter (HOSPITAL_COMMUNITY): Payer: Self-pay | Admitting: Orthopedic Surgery

## 2023-03-22 ENCOUNTER — Other Ambulatory Visit (HOSPITAL_COMMUNITY): Payer: Self-pay

## 2023-03-22 DIAGNOSIS — Z87891 Personal history of nicotine dependence: Secondary | ICD-10-CM | POA: Diagnosis not present

## 2023-03-22 DIAGNOSIS — Z853 Personal history of malignant neoplasm of breast: Secondary | ICD-10-CM | POA: Diagnosis not present

## 2023-03-22 DIAGNOSIS — I10 Essential (primary) hypertension: Secondary | ICD-10-CM | POA: Diagnosis not present

## 2023-03-22 DIAGNOSIS — M1612 Unilateral primary osteoarthritis, left hip: Secondary | ICD-10-CM | POA: Diagnosis not present

## 2023-03-22 DIAGNOSIS — J45909 Unspecified asthma, uncomplicated: Secondary | ICD-10-CM | POA: Diagnosis not present

## 2023-03-22 DIAGNOSIS — Z79899 Other long term (current) drug therapy: Secondary | ICD-10-CM | POA: Diagnosis not present

## 2023-03-22 LAB — CBC
HCT: 30.3 % — ABNORMAL LOW (ref 36.0–46.0)
Hemoglobin: 10.1 g/dL — ABNORMAL LOW (ref 12.0–15.0)
MCH: 33.2 pg (ref 26.0–34.0)
MCHC: 33.3 g/dL (ref 30.0–36.0)
MCV: 99.7 fL (ref 80.0–100.0)
Platelets: 218 10*3/uL (ref 150–400)
RBC: 3.04 MIL/uL — ABNORMAL LOW (ref 3.87–5.11)
RDW: 12.4 % (ref 11.5–15.5)
WBC: 16.2 10*3/uL — ABNORMAL HIGH (ref 4.0–10.5)
nRBC: 0 % (ref 0.0–0.2)

## 2023-03-22 LAB — BASIC METABOLIC PANEL
Anion gap: 7 (ref 5–15)
BUN: 15 mg/dL (ref 8–23)
CO2: 23 mmol/L (ref 22–32)
Calcium: 8.8 mg/dL — ABNORMAL LOW (ref 8.9–10.3)
Chloride: 102 mmol/L (ref 98–111)
Creatinine, Ser: 0.73 mg/dL (ref 0.44–1.00)
GFR, Estimated: >60 mL/min (ref >60)
Glucose, Bld: 139 mg/dL — ABNORMAL HIGH (ref 70–99)
Potassium: 4.2 mmol/L (ref 3.5–5.1)
Sodium: 132 mmol/L — ABNORMAL LOW (ref 135–145)

## 2023-03-22 MED ORDER — SENNA 8.6 MG PO TABS: 2.0000 | ORAL_TABLET | Freq: Every day | ORAL | 0 refills | Status: DC

## 2023-03-22 MED ORDER — ASPIRIN 81 MG PO CHEW: 81.0000 mg | CHEWABLE_TABLET | Freq: Two times a day (BID) | ORAL | 0 refills | Status: DC

## 2023-03-22 MED ORDER — ASPIRIN 81 MG PO CHEW
81.0000 mg | CHEWABLE_TABLET | Freq: Two times a day (BID) | ORAL | 0 refills | Status: AC
  Filled 2023-03-22: qty 56, 28d supply, fill #0

## 2023-03-22 MED ORDER — METHOCARBAMOL 500 MG PO TABS: 500.0000 mg | ORAL_TABLET | Freq: Four times a day (QID) | ORAL | 2 refills | Status: DC | PRN

## 2023-03-22 MED ORDER — METHOCARBAMOL 500 MG PO TABS
500.0000 mg | ORAL_TABLET | Freq: Four times a day (QID) | ORAL | 2 refills | Status: DC | PRN
  Filled 2023-03-22: qty 40, 10d supply, fill #0

## 2023-03-22 MED ORDER — OXYCODONE HCL 5 MG PO TABS: 5.0000 mg | ORAL_TABLET | ORAL | 0 refills | Status: DC | PRN

## 2023-03-22 MED ORDER — SENNA 8.6 MG PO TABS
2.0000 | ORAL_TABLET | Freq: Every day | ORAL | 0 refills | Status: DC
  Filled 2023-03-22: qty 120, 60d supply, fill #0

## 2023-03-22 MED ORDER — OXYCODONE HCL 5 MG PO TABS
5.0000 mg | ORAL_TABLET | ORAL | 0 refills | Status: DC | PRN
  Filled 2023-03-22: qty 14, 4d supply, fill #0

## 2023-03-22 NOTE — Progress Notes (Signed)
TOC  discharge meds in a secure bag delivered to room by this RN- placed on bench seat in room - meds reviewed w/ pt

## 2023-03-22 NOTE — Progress Notes (Signed)
Physical Therapy Treatment Patient Details Name: Ruth Wolfe MRN: [id number] DOB: May 02, 1945 Today's Date: 03/22/2023   History of Present Illness 78 y.o. female admitted 03/21/23 for L AA-THA. PMH: breast cancer s/p lumpectomy, peripheral neuropathy, RA, vocal cord paralysis, fibromyalgia.    PT Comments  Pt is POD # 1 and is progressing well.  Pt motivated and with good pain control. She was able to ambulate 180' and performed stairs similar to home set up.  Pt has home support and DME.  She demonstrated good understanding of HEP and progression. Pt demonstrates safe gait & transfers in order to return home from PT perspective once discharged by MD.  While in hospital, will continue to benefit from PT for skilled therapy to advance mobility and exercises.       If plan is discharge home, recommend the following: A little help with walking and/or transfers;A little help with bathing/dressing/bathroom;Assistance with cooking/housework;Assist for transportation;Help with stairs or ramp for entrance   Can travel by private vehicle        Equipment Recommendations  None recommended by PT    Recommendations for Other Services       Precautions / Restrictions Precautions Precautions: Fall Restrictions Weight Bearing Restrictions Per Provider Order: No Other Position/Activity Restrictions: WBAT LLE     Mobility  Bed Mobility Overal bed mobility: Needs Assistance Bed Mobility: Supine to Sit     Supine to sit: Supervision          Transfers Overall transfer level: Needs assistance Equipment used: Rolling walker (2 wheels) Transfers: Sit to/from Stand Sit to Stand: Supervision                Ambulation/Gait Ambulation/Gait assistance: Contact guard assist, Supervision Gait Distance (Feet): 180 Feet Assistive device: Rolling walker (2 wheels) Gait Pattern/deviations: Step-through pattern, Decreased stride length Gait velocity: decreased     General Gait Details:  Cues for RW proximity and posture - pt did well with carryover and self correcting   Stairs Stairs: Yes Stairs assistance: Contact guard assist Stair Management: One rail Left, Step to pattern, Forwards, With cane Number of Stairs: 5 General stair comments: Pt stating correct sequencing and was able to perform without difficult with rail on L and cane R to simulate home.  Educated on having friend assist with RW   Wheelchair Mobility     Tilt Bed    Modified Rankin (Stroke Patients Only)       Balance Overall balance assessment: Needs assistance Sitting-balance support: No upper extremity supported Sitting balance-Leahy Scale: Good     Standing balance support: No upper extremity supported, Bilateral upper extremity supported Standing balance-Leahy Scale: Fair Standing balance comment: RW to ambulate -steady with RW; able to perform ADLs wtihout UE support                            Cognition Arousal: Alert Behavior During Therapy: WFL for tasks assessed/performed Overall Cognitive Status: Within Functional Limits for tasks assessed                                 General Comments: pleasant and motivated        Exercises Total Joint Exercises Ankle Circles/Pumps: AROM, Both, 20 reps, Supine Quad Sets: AROM, Both, 20 reps, Supine Heel Slides: Left, 10 reps, Supine, AROM Hip ABduction/ADduction: Left, Supine, 5 reps, AROM Long Arc Quad: AROM, Left, Seated,  10 reps Other Exercises Other Exercises: Standing AROM with support at counter L LE: marching (5 reps), hip abd, hip ext, hamstring curl 10 reps Other Exercises: Educated on AAROM technique with gait belt as needed    General Comments  Educated on safe ice use, no pivots, car transfers, TED hose during day. Also, encouraged walking every 1-2 hours during day. Educated on HEP with focus on mobility the first weeks. Discussed doing exercises within pain control and if pain increasing could  decreased ROM, reps, and stop exercises as needed. Encouraged to perform ankle pumps frequently for blood flow .       Pertinent Vitals/Pain Pain Assessment Pain Assessment: 0-10 Pain Score: 3  Pain Location: L hip Pain Descriptors / Indicators: Sore Pain Intervention(s): Limited activity within patient's tolerance, Monitored during session, Premedicated before session, Repositioned, Ice applied    Home Living                          Prior Function            PT Goals (current goals can now be found in the care plan section) Progress towards PT goals: Progressing toward goals    Frequency    7X/week      PT Plan      Co-evaluation              AM-PAC PT "6 Clicks" Mobility   Outcome Measure  Help needed turning from your back to your side while in a flat bed without using bedrails?: A Little Help needed moving from lying on your back to sitting on the side of a flat bed without using bedrails?: A Little Help needed moving to and from a bed to a chair (including a wheelchair)?: A Little Help needed standing up from a chair using your arms (e.g., wheelchair or bedside chair)?: A Little Help needed to walk in hospital room?: A Little Help needed climbing 3-5 steps with a railing? : A Little 6 Click Score: 18    End of Session Equipment Utilized During Treatment: Gait belt Activity Tolerance: Patient tolerated treatment well Patient left: in chair;with call bell/phone within reach;with chair alarm set Nurse Communication: Mobility status PT Visit Diagnosis: Difficulty in walking, not elsewhere classified (R26.2);Pain Pain - Right/Left: Left Pain - part of body: Hip     Time: 1610-9604 PT Time Calculation (min) (ACUTE ONLY): 38 min  Charges:    $Gait Training: 8-22 mins $Therapeutic Exercise: 8-22 mins $Therapeutic Activity: 8-22 mins PT General Charges $$ ACUTE PT VISIT: 1 Visit                     Anise Salvo, PT Acute Rehab [facility] [phone]    Rayetta Humphrey 03/22/2023, 11:32 AM

## 2023-03-22 NOTE — TOC Transition Note (Signed)
Transition of Care [facility]) - Discharge Note   Patient Details  Name: Ruth Wolfe MRN: [id number] Date of Birth: August 07, 1945  Transition of Care [facility]) CM/SW Contact:  Howell Rucks, RN Phone Number: 03/22/2023, 9:51 AM   Clinical Narrative: Met with pt at bedside to review dc therapy and DME needs, pt confirmed HEP,  has walker , no home DME needs. No TOC needs.       Final next level of care: Home/Self Care Barriers to Discharge: No Barriers Identified   Patient Goals and CMS Choice Patient states their goals for this hospitalization and ongoing recovery are:: return home          Discharge Placement                       Discharge Plan and Services Additional resources added to the After Visit Summary for                  DME Arranged: N/A DME Agency: NA       HH Arranged: NA HH Agency: NA        Social Drivers of Health (SDOH) Interventions SDOH Screenings   Food Insecurity: No Food Insecurity (03/21/2023)  Housing: Low Risk  (03/21/2023)  Transportation Needs: No Transportation Needs (03/21/2023)  Utilities: Not At Risk (03/21/2023)  Alcohol Screen: Low Risk  (03/01/2023)  Depression (PHQ2-9): Low Risk  (08/20/2022)  Financial Resource Strain: Low Risk  (03/01/2023)  Physical Activity: Unknown (03/01/2023)  Social Connections: Unknown (03/21/2023)  Stress: No Stress Concern Present (03/01/2023)  Tobacco Use: Medium Risk (03/21/2023)     Readmission Risk Interventions     No data to display

## 2023-03-22 NOTE — Progress Notes (Signed)
   Subjective: 1 Day Post-Op Procedure(s) (LRB): TOTAL HIP ARTHROPLASTY ANTERIOR APPROACH (Left) Patient reports pain as mild.   Patient seen in rounds for Dr. Charlann Boxer. Patient is resting in bed on exam this morning. No acute events overnight. Foley catheter removed. Patient ambulated 75 feet with PT yesterday. We will start therapy today.   Objective: Vital signs in last 24 hours: Temp:  [97.4 F (36.3 C)-98.3 F (36.8 C)] 98.3 F (36.8 C) (01/21 7829) Pulse Rate:  [86-107] 90 (01/21 0608) Resp:  [9-17] 16 (01/21 0608) BP: (105-131)/(57-71) 109/65 (01/21 0608) SpO2:  [96 %-100 %] 96 % (01/21 0608)  Intake/Output from previous day:  Intake/Output Summary (Last 24 hours) at 03/22/2023 0904 Last data filed at 03/22/2023 5621 Gross per 24 hour  Intake 2060 ml  Output 2500 ml  Net -440 ml     Intake/Output this shift: Total I/O In: 240 [P.O.:240] Out: -   Labs: Recent Labs    03/22/23 0329  HGB 10.1*   Recent Labs    03/22/23 0329  WBC 16.2*  RBC 3.04*  HCT 30.3*  PLT 218   Recent Labs    03/22/23 0329  NA 132*  K 4.2  CL 102  CO2 23  BUN 15  CREATININE 0.73  GLUCOSE 139*  CALCIUM 8.8*   No results for input(s): "LABPT", "INR" in the last 72 hours.  Exam: General - Patient is Alert and Oriented Extremity - Neurologically intact Sensation intact distally Dressing - dressing C/D/I Motor Function - intact, moving foot and toes well on exam.   Past Medical History:  Diagnosis Date   Allergy 2013   Arthritis 1980   Asthma 2000   Breast cancer (HCC) 09/22/2021   DCIS   Bruit    Per patient   Cataracts, both eyes 2021   surgery 2022   Colon polyps    Environmental allergies 2013   Fibromyalgia    GERD (gastroesophageal reflux disease) 2013   Glaucoma 2016   POAG OU   History of hiatal hernia    Hyperlipidemia 2014   Hypertensive retinopathy 2021   OU   Macular degeneration 2014   Neuropathy 2013   legs and arms   Osteopenia 2000    Osteoporosis    Paralyzed vocal cords 2013   Per patient   Tongue atrophy 2013   Per patient r/t paralyzed vocal cords   Vitamin B12 deficiency 2021   Vitamin D deficiency 2021    Assessment/Plan: 1 Day Post-Op Procedure(s) (LRB): TOTAL HIP ARTHROPLASTY ANTERIOR APPROACH (Left) Principal Problem:   S/P total left hip arthroplasty  Estimated body mass index is 28.33 kg/m as calculated from the following:   Height as of this encounter: 5\' 1"  (1.549 m).   Weight as of this encounter: 68 kg. Advance diet Up with therapy D/C IV fluids  DVT Prophylaxis - Aspirin Weight bearing as tolerated.  Hgb stable at 10.1 this AM.  Patient has tramadol at home, so I will send a few oxycodone in case she has severe pain.   We will have [facility] deliver her meds for ease.   Plan is to go Home after hospital stay. Plan for discharge today after meeting goals with therapy. Follow up in the office in 2 weeks.   Rosalene Billings, PA-C Orthopedic Surgery [phone] 03/22/2023, 9:04 AM

## 2023-03-22 NOTE — Care Management Obs Status (Signed)
MEDICARE OBSERVATION STATUS NOTIFICATION   Patient Details  Name: Ruth Wolfe MRN: [id number] Date of Birth: 1945-10-02   Medicare Observation Status Notification Given:       Howell Rucks, RN 03/22/2023, 9:47 AM

## 2023-04-05 NOTE — Discharge Summary (Signed)
Patient ID: Ruth Wolfe MRN: [id number] DOB/AGE: [birth year] 78 y.o.  Admit date: 03/21/2023 Discharge date: 03/22/2023  Admission Diagnoses:  Left hip osteoarthritis  Discharge Diagnoses:  Principal Problem:   S/P total left hip arthroplasty   Past Medical History:  Diagnosis Date   Allergy 2013   Arthritis 1980   Asthma 2000   Breast cancer (HCC) 09/22/2021   DCIS   Bruit    Per patient   Cataracts, both eyes 2021   surgery 2022   Colon polyps    Environmental allergies 2013   Fibromyalgia    GERD (gastroesophageal reflux disease) 2013   Glaucoma 2016   POAG OU   History of hiatal hernia    Hyperlipidemia 2014   Hypertensive retinopathy 2021   OU   Macular degeneration 2014   Neuropathy 2013   legs and arms   Osteopenia 2000   Osteoporosis    Paralyzed vocal cords 2013   Per patient   Tongue atrophy 2013   Per patient r/t paralyzed vocal cords   Vitamin B12 deficiency 2021   Vitamin D deficiency 2021    Surgeries: Procedure(s): TOTAL HIP ARTHROPLASTY ANTERIOR APPROACH on 03/21/2023   Consultants:   Discharged Condition: Improved  Hospital Course: Ruth Wolfe is an 78 y.o. female who was admitted 03/21/2023 for operative treatment ofS/P total left hip arthroplasty. Patient has severe unremitting pain that affects sleep, daily activities, and work/hobbies. After pre-op clearance the patient was taken to the operating room on 03/21/2023 and underwent  Procedure(s): TOTAL HIP ARTHROPLASTY ANTERIOR APPROACH.    Patient was given perioperative antibiotics:  Anti-infectives (From admission, onward)    Start     Dose/Rate Route Frequency Ordered Stop   03/21/23 1330  ceFAZolin (ANCEF) IVPB 2g/100 mL premix        2 g 200 mL/hr over 30 Minutes Intravenous Every 6 hours 03/21/23 1013 03/21/23 2100   03/21/23 0600  ceFAZolin (ANCEF) IVPB 2g/100 mL premix        2 g 200 mL/hr over 30 Minutes Intravenous On call to O.R. 03/21/23 2536 03/21/23 0754         Patient was given sequential compression devices, early ambulation, and chemoprophylaxis to prevent DVT. Patient worked with PT and was meeting their goals regarding safe ambulation and transfers.  Patient benefited maximally from hospital stay and there were no complications.    Recent vital signs: No data found.   Recent laboratory studies: No results for input(s): "WBC", "HGB", "HCT", "PLT", "NA", "K", "CL", "CO2", "BUN", "CREATININE", "GLUCOSE", "INR", "CALCIUM" in the last 72 hours.  Invalid input(s): "PT", "2"   Discharge Medications:   Allergies as of 03/22/2023       Reactions   Misc. Sulfonamide Containing Compounds Other (See Comments)   Sulfa Antibiotics    Wheezing, swelling face and ears        Medication List     STOP taking these medications    brimonidine 0.2 % ophthalmic solution Commonly known as: ALPHAGAN   cyanocobalamin 1000 MCG tablet Commonly known as: VITAMIN B12   famotidine 40 MG tablet Commonly known as: PEPCID   moxifloxacin 0.5 % ophthalmic solution Commonly known as: VIGAMOX   naproxen sodium 220 MG tablet Commonly known as: ALEVE   Vitamin D 50 MCG (2000 UT) tablet       TAKE these medications    acetaminophen 500 MG tablet Commonly known as: TYLENOL Take 500 mg by mouth every 8 (eight) hours as needed for moderate  pain (pain score 4-6).   Aspirin Low Dose 81 MG chewable tablet Generic drug: aspirin Chew 1 tablet (81 mg total) by mouth 2 (two) times daily for 28 days.   fluticasone 50 MCG/ACT nasal spray Commonly known as: FLONASE SPRAY 2 SPRAYS INTO EACH NOSTRIL EVERY DAY What changed: See the new instructions.   gabapentin 300 MG capsule Commonly known as: NEURONTIN Take 300 mg by mouth 3 (three) times daily.   methocarbamol 500 MG tablet Commonly known as: ROBAXIN Take 1 tablet (500 mg total) by mouth every 6 (six) hours as needed for muscle spasms (muscle/thigh pain).   Miebo 1.338 GM/ML Soln Generic drug:  Perfluorohexyloctane Apply 1 drop to eye 4 (four) times daily.   oxyCODONE 5 MG immediate release tablet Commonly known as: Oxy IR/ROXICODONE Take 1 tablet (5 mg total) by mouth every 4 (four) hours as needed for severe pain (pain score 7-10). Do not use with tramadol.   Rhopressa 0.02 % Soln Generic drug: Netarsudil Dimesylate Place 1 drop into both eyes at bedtime.   senna 8.6 MG Tabs tablet Commonly known as: SENOKOT Take 2 tablets (17.2 mg total) by mouth at bedtime.   traMADol 50 MG tablet Commonly known as: ULTRAM Take 50 mg by mouth at bedtime.   Wixela Inhub 250-50 MCG/ACT Aepb Generic drug: fluticasone-salmeterol INHALE 1 PUFF INTO THE LUNGS IN THE MORNING AND AT BEDTIME. What changed:  when to take this additional instructions               Discharge Care Instructions  (From admission, onward)           Start     Ordered   03/22/23 0000  Change dressing       Comments: Maintain surgical dressing until follow up in the clinic. If the edges start to pull up, may reinforce with tape. If the dressing is no longer working, may remove and cover with gauze and tape, but must keep the area dry and clean.  Call with any questions or concerns.   03/22/23 0906            Diagnostic Studies: DG HIP UNILAT WITH PELVIS 1V LEFT Addendum Date: 03/21/2023 ADDENDUM REPORT: 03/21/2023 12:27 ADDENDUM: The original report findings and impression were dictated in error. The corrected report is below. FLUOROSCOPY TIME:  Radiation Exposure Index (as provided by the fluoroscopic device): 1.13 mGy Kerma C-arm fluoroscopic images were obtained intraoperatively and submitted for post operative interpretation. FINDINGS: Multiple intraoperative fluoroscopic images demonstrate interval left total hip arthroplasty. On the final image, the femoral head component has not yet been placed. Components are well aligned. No acute osseous abnormality. IMPRESSION: 1. Intraoperative fluoroscopic  guidance for left total hip arthroplasty. Electronically Signed   By: Obie Dredge M.D.   On: 03/21/2023 12:27   Result Date: 03/21/2023 CLINICAL DATA:  Left hip replacement. EXAM: DG HIP (WITH OR WITHOUT PELVIS) 1V*L* COMPARISON:  CT abdomen pelvis dated April 02, 2022. FINDINGS: The left hip demonstrates a total arthroplasty without evidence of hardware failure or complication. There is no fracture or dislocation. The alignment is anatomic. Post-surgical changes noted in the surrounding soft tissues. Mild right hip osteoarthritis. IMPRESSION: 1. Left hip total arthroplasty without immediate postoperative complication. Electronically Signed: By: Obie Dredge M.D. On: 03/21/2023 12:19   DG Pelvis Portable Result Date: 03/21/2023 CLINICAL DATA:  Left hip replacement. EXAM: PORTABLE PELVIS 1-2 VIEWS COMPARISON:  CT abdomen and pelvis dated April 02, 2022. FINDINGS: The left hip demonstrates a  total arthroplasty without evidence of hardware failure or complication. There is no fracture or dislocation. The alignment is anatomic. Post-surgical changes noted in the surrounding soft tissues. Mild right hip osteoarthritis. IMPRESSION: 1. Left total hip arthroplasty without immediate postoperative complication. Electronically Signed   By: Obie Dredge M.D.   On: 03/21/2023 12:24   DG C-Arm 1-60 Min-No Report Result Date: 03/21/2023 Fluoroscopy was utilized by the requesting physician.  No radiographic interpretation.   OCT, Retina - OU - Both Eyes Result Date: 03/18/2023 Right Eye Quality was good. Central Foveal Thickness: 325. Progression has improved. Findings include no SRF, abnormal foveal contour, myopic contour, retinal drusen , intraretinal fluid, vitreous traction (VMT with mild interval improvement in cystic changes--VMT releasing). Left Eye Quality was good. Central Foveal Thickness: 230. Progression has been stable. Findings include normal foveal contour, no IRF, no SRF, retinal drusen ,  vitreous traction (stable release of central VMT and stable resolution of central cystic changes, partial PVD). Notes *Images captured and stored on drive Diagnosis / Impression: Non-exu ARMD OU VMT with central cystic changes OU OD: VMT with mild interval improvement in cystic changes--VMT releasing OS: stable release of central VMT and stable resolution of central cystic changes, partial PVD Clinical management: See below Abbreviations: NFP - Normal foveal profile. CME - cystoid macular edema. PED - pigment epithelial detachment. IRF - intraretinal fluid. SRF - subretinal fluid. EZ - ellipsoid zone. ERM - epiretinal membrane. ORA - outer retinal atrophy. ORT - outer retinal tubulation. SRHM - subretinal hyper-reflective material    Disposition: Discharge disposition: 01-Home or Self Care       Discharge Instructions     Call MD / Call 911   Complete by: As directed    If you experience chest pain or shortness of breath, CALL 911 and be transported to the hospital emergency room.  If you develope a fever above 101 F, pus (white drainage) or increased drainage or redness at the wound, or calf pain, call your surgeon's office.   Change dressing   Complete by: As directed    Maintain surgical dressing until follow up in the clinic. If the edges start to pull up, may reinforce with tape. If the dressing is no longer working, may remove and cover with gauze and tape, but must keep the area dry and clean.  Call with any questions or concerns.   Constipation Prevention   Complete by: As directed    Drink plenty of fluids.  Prune juice may be helpful.  You may use a stool softener, such as Colace (over the counter) 100 mg twice a day.  Use MiraLax (over the counter) for constipation as needed.   Diet - low sodium heart healthy   Complete by: As directed    Increase activity slowly as tolerated   Complete by: As directed    Weight bearing as tolerated with assist device (walker, cane, etc) as  directed, use it as long as suggested by your surgeon or therapist, typically at least 4-6 weeks.   Post-operative opioid taper instructions:   Complete by: As directed    POST-OPERATIVE OPIOID TAPER INSTRUCTIONS: It is important to wean off of your opioid medication as soon as possible. If you do not need pain medication after your surgery it is ok to stop day one. Opioids include: Codeine, Hydrocodone(Norco, Vicodin), Oxycodone(Percocet, oxycontin) and hydromorphone amongst others.  Long term and even short term use of opiods can cause: Increased pain response Dependence Constipation Depression Respiratory depression And  more.  Withdrawal symptoms can include Flu like symptoms Nausea, vomiting And more Techniques to manage these symptoms Hydrate well Eat regular healthy meals Stay active Use relaxation techniques(deep breathing, meditating, yoga) Do Not substitute Alcohol to help with tapering If you have been on opioids for less than two weeks and do not have pain than it is ok to stop all together.  Plan to wean off of opioids This plan should start within one week post op of your joint replacement. Maintain the same interval or time between taking each dose and first decrease the dose.  Cut the total daily intake of opioids by one tablet each day Next start to increase the time between doses. The last dose that should be eliminated is the evening dose.      TED hose   Complete by: As directed    Use stockings (TED hose) for 2 weeks on both leg(s).  You may remove them at night for sleeping.        Follow-up Information     Durene Romans, MD. Schedule an appointment as soon as possible for a visit in 2 week(s).   Specialty: Orthopedic Surgery Contact information: [address] [place] [address] [phone]                  Signed: Cassandria Anger 04/05/2023, 10:03 AM

## 2023-04-18 DIAGNOSIS — Z853 Personal history of malignant neoplasm of breast: Secondary | ICD-10-CM | POA: Diagnosis not present

## 2023-04-18 DIAGNOSIS — R922 Inconclusive mammogram: Secondary | ICD-10-CM | POA: Diagnosis not present

## 2023-04-18 DIAGNOSIS — R92331 Mammographic heterogeneous density, right breast: Secondary | ICD-10-CM | POA: Diagnosis not present

## 2023-04-18 LAB — HM MAMMOGRAPHY

## 2023-04-19 ENCOUNTER — Encounter: Payer: Self-pay | Admitting: Family

## 2023-04-21 DIAGNOSIS — H01116 Allergic dermatitis of left eye, unspecified eyelid: Secondary | ICD-10-CM | POA: Diagnosis not present

## 2023-04-21 DIAGNOSIS — H01113 Allergic dermatitis of right eye, unspecified eyelid: Secondary | ICD-10-CM | POA: Diagnosis not present

## 2023-04-29 DIAGNOSIS — H401111 Primary open-angle glaucoma, right eye, mild stage: Secondary | ICD-10-CM | POA: Diagnosis not present

## 2023-04-29 DIAGNOSIS — H01113 Allergic dermatitis of right eye, unspecified eyelid: Secondary | ICD-10-CM | POA: Diagnosis not present

## 2023-04-29 DIAGNOSIS — H01116 Allergic dermatitis of left eye, unspecified eyelid: Secondary | ICD-10-CM | POA: Diagnosis not present

## 2023-04-29 DIAGNOSIS — H401122 Primary open-angle glaucoma, left eye, moderate stage: Secondary | ICD-10-CM | POA: Diagnosis not present

## 2023-05-07 DIAGNOSIS — M25552 Pain in left hip: Secondary | ICD-10-CM | POA: Diagnosis not present

## 2023-05-10 DIAGNOSIS — H401111 Primary open-angle glaucoma, right eye, mild stage: Secondary | ICD-10-CM | POA: Diagnosis not present

## 2023-05-13 DIAGNOSIS — Z471 Aftercare following joint replacement surgery: Secondary | ICD-10-CM | POA: Diagnosis not present

## 2023-05-13 DIAGNOSIS — Z96642 Presence of left artificial hip joint: Secondary | ICD-10-CM | POA: Diagnosis not present

## 2023-05-19 DIAGNOSIS — M48062 Spinal stenosis, lumbar region with neurogenic claudication: Secondary | ICD-10-CM | POA: Diagnosis not present

## 2023-05-19 DIAGNOSIS — M51369 Other intervertebral disc degeneration, lumbar region without mention of lumbar back pain or lower extremity pain: Secondary | ICD-10-CM | POA: Diagnosis not present

## 2023-05-19 DIAGNOSIS — M545 Low back pain, unspecified: Secondary | ICD-10-CM | POA: Diagnosis not present

## 2023-05-19 DIAGNOSIS — M4316 Spondylolisthesis, lumbar region: Secondary | ICD-10-CM | POA: Diagnosis not present

## 2023-05-24 ENCOUNTER — Other Ambulatory Visit: Payer: Self-pay | Admitting: Family

## 2023-05-24 ENCOUNTER — Ambulatory Visit
Admission: EM | Admit: 2023-05-24 | Discharge: 2023-05-24 | Disposition: A | Discharge status: Home / Self Care | Attending: Family Medicine | Admitting: Family Medicine

## 2023-05-24 DIAGNOSIS — M25551 Pain in right hip: Secondary | ICD-10-CM

## 2023-05-24 MED ORDER — CYCLOBENZAPRINE HCL 5 MG PO TABS: 5.0000 mg | ORAL_TABLET | Freq: Every evening | ORAL | 0 refills | Status: DC | PRN

## 2023-05-24 NOTE — ED Provider Notes (Signed)
 [facility] - URGENT [facility]ARE C[facility]  Note:  This document was prepared using Conservation officer, historic buildings and may include unintentional dictation errors.  MRN: [id number] DOB: 1945/08/30  Subjective:   Ruth Wolfe is a 78 y.o. female presenting for 1 month history of persistent right hip pain.  Symptoms are severe, debilitating, keep her awake at night.  Has been working with her orthopedist consistently.  Did a trial of steroids for 3 weeks per patient.  Underwent a general steroid injection 5 days ago.  She is now going to be seen by a different specialist for a directed injection on 06/03/2023.  She has been using methocarbamol and would like to have this changed to a different muscle relaxant.  Has previously tried Flexeril.  No current facility-administered medications for this encounter.  Current Outpatient Medications:    acetaminophen (TYLENOL) 500 MG tablet, Take 500 mg by mouth every 8 (eight) hours as needed for moderate pain (pain score 4-6)., Disp: , Rfl:    fluticasone (FLONASE) 50 MCG/ACT nasal spray, SPRAY 2 SPRAYS INTO EACH NOSTRIL EVERY DAY (Patient taking differently: Place 1 spray into both nostrils daily as needed for allergies.), Disp: 48 mL, Rfl: 2   gabapentin (NEURONTIN) 300 MG capsule, Take 300 mg by mouth 3 (three) times daily., Disp: , Rfl:    methocarbamol (ROBAXIN) 500 MG tablet, Take 1 tablet (500 mg total) by mouth every 6 (six) hours as needed for muscle spasms (muscle/thigh pain)., Disp: 40 tablet, Rfl: 2   MIEBO 1.338 GM/ML SOLN, Apply 1 drop to eye 4 (four) times daily., Disp: , Rfl:    oxyCODONE (OXY IR/ROXICODONE) 5 MG immediate release tablet, Take 1 tablet (5 mg total) by mouth every 4 (four) hours as needed for severe pain (pain score 7-10). Do not use with tramadol., Disp: 14 tablet, Rfl: 0   RHOPRESSA 0.02 % SOLN, Place 1 drop into both eyes at bedtime., Disp: , Rfl:    senna (SENOKOT) 8.6 MG TABS tablet, Take 2 tablets (17.2 mg total) by mouth  at bedtime., Disp: 120 tablet, Rfl: 0   traMADol (ULTRAM) 50 MG tablet, Take 50 mg by mouth at bedtime., Disp: , Rfl:    WIXELA INHUB 250-50 MCG/ACT AEPB, INHALE 1 PUFF INTO THE LUNGS IN THE MORNING AND AT BEDTIME. (Patient taking differently: Inhale 1 puff into the lungs at bedtime.), Disp: 180 each, Rfl: 3   Allergies  Allergen Reactions   Misc. Sulfonamide Containing Compounds Other (See Comments)   Sulfa Antibiotics     Wheezing, swelling face and ears    Past Medical History:  Diagnosis Date   Allergy 2013   Arthritis 1980   Asthma 2000   Breast cancer (HCC) 09/22/2021   DCIS   Bruit    Per patient   Cataracts, both eyes 2021   surgery 2022   Colon polyps    Environmental allergies 2013   Fibromyalgia    GERD (gastroesophageal reflux disease) 2013   Glaucoma 2016   POAG OU   History of hiatal hernia    Hyperlipidemia 2014   Hypertensive retinopathy 2021   OU   Macular degeneration 2014   Neuropathy 2013   legs and arms   Osteopenia 2000   Osteoporosis    Paralyzed vocal cords 2013   Per patient   Tongue atrophy 2013   Per patient r/t paralyzed vocal cords   Vitamin B12 deficiency 2021   Vitamin D deficiency 2021     Past Surgical History:  Procedure Laterality Date   APPENDECTOMY  1981   with hysterectomy   BREAST LUMPECTOMY WITH RADIOACTIVE SEED LOCALIZATION Left 10/28/2021   Procedure: LEFT BREAST SEED LUMPECTOMY;  Surgeon: Harriette Bouillon, MD;  Location: [facility] OR;  Service: General;  Laterality: Left;   CATARACT EXTRACTION Bilateral    CERVICAL SPINE SURGERY  01/30/2020   Per patient   TOTAL ABDOMINAL HYSTERECTOMY  1981   TOTAL HIP ARTHROPLASTY Left 03/21/2023   Procedure: TOTAL HIP ARTHROPLASTY ANTERIOR APPROACH;  Surgeon: Durene Romans, MD;  Location: [facility];  Service: Orthopedics;  Laterality: Left;   UPPER GASTROINTESTINAL ENDOSCOPY  12/10/2022    Family History  Problem Relation Age of Onset   Hypertension Mother    Lung cancer Father        he  smoked   Glaucoma Father    Gout Father    Alzheimer's disease Father    Hypertension Sister    Liver disease Sister    Brain cancer Cousin        glioblastoma, paternal first cousin   Colon cancer Neg Hx    Stomach cancer Neg Hx     Social History   Tobacco Use   Smoking status: Former    Types: Cigarettes   Smokeless tobacco: Never   Tobacco comments:    College years  Vaping Use   Vaping status: Never Used  Substance Use Topics   Alcohol use: Yes    Alcohol/week: 2.0 standard drinks of alcohol    Types: 1 Glasses of wine, 1 Cans of beer per week    Comment: 1 per day   Drug use: No    ROS   Objective:   Vitals: BP (!) 149/90 (BP Location: Left Arm)   Pulse (!) 104   Temp 99 F (37.2 C) (Oral)   Resp 20   SpO2 97%   Physical Exam Constitutional:      General: She is not in acute distress.    Appearance: Normal appearance. She is well-developed. She is not ill-appearing, toxic-appearing or diaphoretic.  HENT:     Head: Normocephalic and atraumatic.     Nose: Nose normal.     Mouth/Throat:     Mouth: Mucous membranes are moist.  Eyes:     General: No scleral icterus.       Right eye: No discharge.        Left eye: No discharge.     Extraocular Movements: Extraocular movements intact.  Cardiovascular:     Rate and Rhythm: Normal rate.  Pulmonary:     Effort: Pulmonary effort is normal.  Musculoskeletal:     Lumbar back: Spasms and tenderness (over area outlined) present. No swelling, edema, deformity, signs of trauma, lacerations or bony tenderness. Decreased range of motion. Negative right straight leg raise test and negative left straight leg raise test. No scoliosis.       Back:  Skin:    General: Skin is warm and dry.  Neurological:     General: No focal deficit present.     Mental Status: She is alert and oriented to person, place, and time.  Psychiatric:        Mood and Affect: Mood normal.        Behavior: Behavior normal.      Assessment and Plan :   PDMP not reviewed this encounter.  1. Pain in joint involving right pelvic region and thigh    Recommended continued follow up with her orthopedist. Will avoid further steroid use as  she is being managed specifically for this with her orthopedist. Will use a trial of Flexeril at bedtime only. Counseled patient on potential for adverse effects with medications prescribed/recommended today, ER and return-to-clinic precautions discussed, patient verbalized understanding.    Wallis Bamberg, PA-C 05/24/23 1610

## 2023-05-24 NOTE — ED Triage Notes (Signed)
 Pt c/o lower back pain x 3 weeks-denies injury-states she has been seen at [facility] with xrays/she completed prednisone x 2 rounds-states she was last seen there last week-states she is scheduled for "spinal injection 4/4"-slow gait with own cane

## 2023-05-25 ENCOUNTER — Other Ambulatory Visit: Payer: Self-pay | Admitting: Family

## 2023-05-25 NOTE — Telephone Encounter (Signed)
Duplicate request, medication was refilled today.

## 2023-05-25 NOTE — Telephone Encounter (Signed)
 Copied from CRM [id number]. Topic: Clinical - Medication Refill >> May 25, 2023  9:08 AM Elizebeth Brooking wrote: Most Recent Primary Care Visit:  Provider: Ria Clock [facility]  Department: [facility]  Visit Type: OFFICE VISIT  Date: 03/08/2023  Medication: Monte Fantasia INHUB 250-50 MCG/ACT AEPB  Has the patient contacted their pharmacy? Yes (Agent: If no, request that the patient contact the pharmacy for the refill. If patient does not wish to contact the pharmacy document the reason why and proceed with request.) (Agent: If yes, when and what did the pharmacy advise?)  Is this the correct pharmacy for this prescription? Yes If no, delete pharmacy and type the correct one.  This is the patient's preferred pharmacy:  [id number] [place], [place] - [address] [place] [address] Phone: [phone] Fax: [phone]  [facility] [address] [place] [address] Phone: [phone] Fax: [phone]   Has the prescription been filled recently? No  Is the patient out of the medication? Yes  Has the patient been seen for an appointment in the last year OR does the patient have an upcoming appointment? Yes  Can we respond through MyChart? Yes  Agent: Please be advised that Rx refills may take up to 3 business days. We ask that you follow-up with your pharmacy.

## 2023-05-31 DIAGNOSIS — M5416 Radiculopathy, lumbar region: Secondary | ICD-10-CM | POA: Diagnosis not present

## 2023-06-02 DIAGNOSIS — M5451 Vertebrogenic low back pain: Secondary | ICD-10-CM | POA: Diagnosis not present

## 2023-06-06 DIAGNOSIS — M5451 Vertebrogenic low back pain: Secondary | ICD-10-CM | POA: Diagnosis not present

## 2023-06-07 DIAGNOSIS — M5416 Radiculopathy, lumbar region: Secondary | ICD-10-CM | POA: Diagnosis not present

## 2023-06-07 DIAGNOSIS — M5459 Other low back pain: Secondary | ICD-10-CM | POA: Diagnosis not present

## 2023-06-09 DIAGNOSIS — M5451 Vertebrogenic low back pain: Secondary | ICD-10-CM | POA: Diagnosis not present

## 2023-06-13 DIAGNOSIS — M5451 Vertebrogenic low back pain: Secondary | ICD-10-CM | POA: Diagnosis not present

## 2023-06-14 DIAGNOSIS — M25512 Pain in left shoulder: Secondary | ICD-10-CM | POA: Diagnosis not present

## 2023-06-14 DIAGNOSIS — M7542 Impingement syndrome of left shoulder: Secondary | ICD-10-CM | POA: Diagnosis not present

## 2023-06-15 DIAGNOSIS — M5416 Radiculopathy, lumbar region: Secondary | ICD-10-CM | POA: Diagnosis not present

## 2023-06-16 DIAGNOSIS — M5451 Vertebrogenic low back pain: Secondary | ICD-10-CM | POA: Diagnosis not present

## 2023-06-20 DIAGNOSIS — M5451 Vertebrogenic low back pain: Secondary | ICD-10-CM | POA: Diagnosis not present

## 2023-06-23 DIAGNOSIS — M5451 Vertebrogenic low back pain: Secondary | ICD-10-CM | POA: Diagnosis not present

## 2023-06-27 DIAGNOSIS — M5451 Vertebrogenic low back pain: Secondary | ICD-10-CM | POA: Diagnosis not present

## 2023-07-01 DIAGNOSIS — M5451 Vertebrogenic low back pain: Secondary | ICD-10-CM | POA: Diagnosis not present

## 2023-07-03 DIAGNOSIS — M5459 Other low back pain: Secondary | ICD-10-CM | POA: Diagnosis not present

## 2023-07-06 DIAGNOSIS — M539 Dorsopathy, unspecified: Secondary | ICD-10-CM | POA: Diagnosis not present

## 2023-07-06 DIAGNOSIS — M8448XD Pathological fracture, other site, subsequent encounter for fracture with routine healing: Secondary | ICD-10-CM | POA: Diagnosis not present

## 2023-07-06 DIAGNOSIS — S32040A Wedge compression fracture of fourth lumbar vertebra, initial encounter for closed fracture: Secondary | ICD-10-CM | POA: Diagnosis not present

## 2023-07-06 DIAGNOSIS — M8448XA Pathological fracture, other site, initial encounter for fracture: Secondary | ICD-10-CM | POA: Diagnosis not present

## 2023-07-07 DIAGNOSIS — H6123 Impacted cerumen, bilateral: Secondary | ICD-10-CM | POA: Diagnosis not present

## 2023-07-07 DIAGNOSIS — H66001 Acute suppurative otitis media without spontaneous rupture of ear drum, right ear: Secondary | ICD-10-CM | POA: Diagnosis not present

## 2023-07-07 DIAGNOSIS — Z6827 Body mass index (BMI) 27.0-27.9, adult: Secondary | ICD-10-CM | POA: Diagnosis not present

## 2023-07-08 DIAGNOSIS — M5451 Vertebrogenic low back pain: Secondary | ICD-10-CM | POA: Diagnosis not present

## 2023-07-19 ENCOUNTER — Other Ambulatory Visit (HOSPITAL_COMMUNITY): Payer: Self-pay | Admitting: Interventional Radiology

## 2023-07-19 ENCOUNTER — Telehealth (HOSPITAL_COMMUNITY): Payer: Self-pay

## 2023-07-19 DIAGNOSIS — M8448XD Pathological fracture, other site, subsequent encounter for fracture with routine healing: Secondary | ICD-10-CM

## 2023-07-19 NOTE — Telephone Encounter (Signed)
 Ok per Dr. Alvira Josephs for Sacroplasty. AB

## 2023-07-19 NOTE — Telephone Encounter (Signed)
 Called to let pt know that we received referral and are waiting for Dr. Alvira Josephs to review. I will call back once approved. AB

## 2023-07-20 ENCOUNTER — Telehealth (HOSPITAL_COMMUNITY): Payer: Self-pay

## 2023-07-20 NOTE — Telephone Encounter (Signed)
 Called to schedule sacroplasty, no answer, left vm. AB

## 2023-07-22 ENCOUNTER — Other Ambulatory Visit (HOSPITAL_COMMUNITY): Payer: Self-pay | Admitting: Student

## 2023-07-22 DIAGNOSIS — M25551 Pain in right hip: Secondary | ICD-10-CM

## 2023-07-26 ENCOUNTER — Ambulatory Visit (HOSPITAL_COMMUNITY)
Admission: RE | Admit: 2023-07-26 | Discharge: 2023-07-26 | Disposition: A | Discharge status: Home / Self Care | Source: Ambulatory Visit | Attending: Interventional Radiology | Admitting: Interventional Radiology

## 2023-07-26 ENCOUNTER — Other Ambulatory Visit: Payer: Self-pay

## 2023-07-26 DIAGNOSIS — M8448XD Pathological fracture, other site, subsequent encounter for fracture with routine healing: Secondary | ICD-10-CM | POA: Diagnosis present

## 2023-07-26 DIAGNOSIS — S3210XA Unspecified fracture of sacrum, initial encounter for closed fracture: Secondary | ICD-10-CM | POA: Diagnosis not present

## 2023-07-26 DIAGNOSIS — M8008XA Age-related osteoporosis with current pathological fracture, vertebra(e), initial encounter for fracture: Secondary | ICD-10-CM | POA: Insufficient documentation

## 2023-07-26 DIAGNOSIS — Z87891 Personal history of nicotine dependence: Secondary | ICD-10-CM | POA: Insufficient documentation

## 2023-07-26 DIAGNOSIS — M25551 Pain in right hip: Secondary | ICD-10-CM

## 2023-07-26 HISTORY — PX: IR SACROPLASTY BILATERAL: IMG5561

## 2023-07-26 LAB — CBC WITH DIFFERENTIAL/PLATELET
Abs Immature Granulocytes: 0.02 10*3/uL (ref 0.00–0.07)
Basophils Absolute: 0 10*3/uL (ref 0.0–0.1)
Basophils Relative: 1 %
Eosinophils Absolute: 0.3 10*3/uL (ref 0.0–0.5)
Eosinophils Relative: 4 %
HCT: 41.2 % (ref 36.0–46.0)
Hemoglobin: 13.7 g/dL (ref 12.0–15.0)
Immature Granulocytes: 0 %
Lymphocytes Relative: 21 %
Lymphs Abs: 1.5 10*3/uL (ref 0.7–4.0)
MCH: 32.8 pg (ref 26.0–34.0)
MCHC: 33.3 g/dL (ref 30.0–36.0)
MCV: 98.6 fL (ref 80.0–100.0)
Monocytes Absolute: 0.8 10*3/uL (ref 0.1–1.0)
Monocytes Relative: 11 %
Neutro Abs: 4.5 10*3/uL (ref 1.7–7.7)
Neutrophils Relative %: 63 %
Platelets: 266 10*3/uL (ref 150–400)
RBC: 4.18 MIL/uL (ref 3.87–5.11)
RDW: 13.7 % (ref 11.5–15.5)
WBC: 7.1 10*3/uL (ref 4.0–10.5)
nRBC: 0 % (ref 0.0–0.2)

## 2023-07-26 LAB — BASIC METABOLIC PANEL WITH GFR
Anion gap: 8 (ref 5–15)
BUN: 7 mg/dL — ABNORMAL LOW (ref 8–23)
CO2: 26 mmol/L (ref 22–32)
Calcium: 9.5 mg/dL (ref 8.9–10.3)
Chloride: 105 mmol/L (ref 98–111)
Creatinine, Ser: 0.76 mg/dL (ref 0.44–1.00)
GFR, Estimated: >60 mL/min (ref >60)
Glucose, Bld: 84 mg/dL (ref 70–99)
Potassium: 3.9 mmol/L (ref 3.5–5.1)
Sodium: 139 mmol/L (ref 135–145)

## 2023-07-26 LAB — PROTIME-INR
INR: 1 (ref 0.8–1.2)
Prothrombin Time: 13 s (ref 11.4–15.2)

## 2023-07-26 MED ORDER — MIDAZOLAM HCL 2 MG/2ML IJ SOLN
INTRAMUSCULAR | Status: AC
  Filled 2023-07-26: qty 2

## 2023-07-26 MED ORDER — BUPIVACAINE HCL (PF) 0.5 % IJ SOLN
INTRAMUSCULAR | Status: AC
Start: 2023-07-26 — End: ?
  Filled 2023-07-26: qty 30

## 2023-07-26 MED ORDER — TOBRAMYCIN SULFATE 1.2 G IJ SOLR
INTRAMUSCULAR | Status: AC
  Filled 2023-07-26: qty 1.2

## 2023-07-26 MED ORDER — CEFAZOLIN SODIUM-DEXTROSE 2-4 GM/100ML-% IV SOLN
INTRAVENOUS | Status: AC | PRN
  Administered 2023-07-26: 2 g via INTRAVENOUS

## 2023-07-26 MED ORDER — IOHEXOL 300 MG/ML  SOLN
50.0000 mL | Freq: Once | INTRAMUSCULAR | Status: AC | PRN
  Administered 2023-07-26: 3 mL via INTRAVENOUS

## 2023-07-26 MED ORDER — BUPIVACAINE HCL (PF) 0.5 % IJ SOLN
30.0000 mL | Freq: Once | INTRAMUSCULAR | Status: AC
  Administered 2023-07-26: 20 mL

## 2023-07-26 MED ORDER — FENTANYL CITRATE (PF) 100 MCG/2ML IJ SOLN
INTRAMUSCULAR | Status: AC
  Filled 2023-07-26: qty 2

## 2023-07-26 MED ORDER — MIDAZOLAM HCL 2 MG/2ML IJ SOLN
INTRAMUSCULAR | Status: AC
Start: 2023-07-26 — End: ?
  Filled 2023-07-26: qty 2

## 2023-07-26 MED ORDER — FENTANYL CITRATE (PF) 100 MCG/2ML IJ SOLN
INTRAMUSCULAR | Status: AC | PRN
  Administered 2023-07-26 (×2): 25 ug via INTRAVENOUS

## 2023-07-26 MED ORDER — GELATIN ABSORBABLE 12-7 MM EX MISC
CUTANEOUS | Status: AC
  Filled 2023-07-26: qty 1

## 2023-07-26 MED ORDER — CEFAZOLIN SODIUM-DEXTROSE 2-4 GM/100ML-% IV SOLN: 2.0000 g | INTRAVENOUS | Status: DC

## 2023-07-26 MED ORDER — GELATIN ABSORBABLE 12-7 MM EX MISC
1.0000 | Freq: Once | CUTANEOUS | Status: AC
  Administered 2023-07-26: 1 via TOPICAL
  Filled 2023-07-26: qty 1

## 2023-07-26 MED ORDER — CEFAZOLIN SODIUM-DEXTROSE 2-4 GM/100ML-% IV SOLN
INTRAVENOUS | Status: AC
Start: 2023-07-26 — End: ?
  Filled 2023-07-26: qty 100

## 2023-07-26 MED ORDER — HYDROMORPHONE HCL 1 MG/ML IJ SOLN
INTRAMUSCULAR | Status: AC | PRN
  Administered 2023-07-26 (×2): .5 mg via INTRAVENOUS

## 2023-07-26 MED ORDER — HYDROMORPHONE HCL 1 MG/ML IJ SOLN
INTRAMUSCULAR | Status: AC
  Filled 2023-07-26: qty 1

## 2023-07-26 MED ORDER — MIDAZOLAM HCL 2 MG/2ML IJ SOLN
INTRAMUSCULAR | Status: AC | PRN
  Administered 2023-07-26: 1 mg via INTRAVENOUS
  Administered 2023-07-26 (×3): .5 mg via INTRAVENOUS

## 2023-07-26 NOTE — Discharge Instructions (Addendum)
 INR.  1.  No stooping, bending or lifting weights above 10 pounds for 2 weeks.  2.  Use a walker to ambulate for 2 weeks.  3.  No driving for 2 weeks.  4.  Follow-up with referring MD in 2 weeks as needed.  Activity Rest as told by your health care provider. Return to your normal activities as told by your health care provider. Ask your health care provider what activities are safe for you. May shower and remove bandage 24 hours after discharge. If you were given a sedative during the procedure, it can affect you for several hours. Do not drive or operate machinery until your health care provider says that it is safe. General instructions      Check your site every day for signs of infection. Check for: Redness, swelling, or pain. Fluid or blood. Warmth. Pus or a bad smell. Take over-the-counter and prescription medicines only as told by your health care provider. Contact a health care provider if: You have redness, swelling, warmth, pus, or pain around the  site.

## 2023-07-26 NOTE — H&P (Signed)
 Chief Complaint: Patient was seen in consultation today for bilateral sacral insufficiency fracture  at the request of Adelaide Adjutant, MD  Referring Physician(s): Adelaide Adjutant, MD  Supervising Physician: Luellen Sages  Patient Status: [facility] - Out-pt  Full Code  History of Present Illness: Ruth Wolfe is a 78 y.o. female with history of GERD, arthritis, asthma, breast cancer, glaucoma, osteoporosis, neuropathy, vitamin B12 and D deficiency. Patient presents today with bilateral sacral insuffiencey fractures. Patient reports being seen for this condition by Adelaide Adjutant, MD with [facility]. Patient had a lumbar MRI performed 07/03/23, with evidence of bilateral sacral insufficiency fractures. Patient has utilized tylenol  for her symptoms.   Patient denies current: back pain, chest pain, shortness of breath, nausea, vomiting, abdominal pain, diarrhea, fever, and/or cough. Patient has not had food or drink since midnight. She has a driver today. Patient does not take anticoagulants.   Past Medical History:  Diagnosis Date   Allergy  2013   Arthritis 1980   Asthma 2000   Breast cancer (HCC) 09/22/2021   DCIS   Bruit    Per patient   Cataracts, both eyes 2021   surgery 2022   Colon polyps    Environmental allergies 2013   Fibromyalgia    GERD (gastroesophageal reflux disease) 2013   Glaucoma 2016   POAG OU   History of hiatal hernia    Hyperlipidemia 2014   Hypertensive retinopathy 2021   OU   Macular degeneration 2014   Neuropathy 2013   legs and arms   Osteopenia 2000   Osteoporosis    Paralyzed vocal cords 2013   Per patient   Tongue atrophy 2013   Per patient r/t paralyzed vocal cords   Vitamin B12 deficiency 2021   Vitamin D  deficiency 2021    Past Surgical History:  Procedure Laterality Date   APPENDECTOMY  1981   with hysterectomy   BREAST LUMPECTOMY WITH RADIOACTIVE SEED LOCALIZATION Left 10/28/2021   Procedure: LEFT BREAST SEED LUMPECTOMY;   Surgeon: Sim Dryer, MD;  Location: [facility] OR;  Service: General;  Laterality: Left;   CATARACT EXTRACTION Bilateral    CERVICAL SPINE SURGERY  01/30/2020   Per patient   TOTAL ABDOMINAL HYSTERECTOMY  1981   TOTAL HIP ARTHROPLASTY Left 03/21/2023   Procedure: TOTAL HIP ARTHROPLASTY ANTERIOR APPROACH;  Surgeon: Claiborne Crew, MD;  Location: [facility]S;  Service: Orthopedics;  Laterality: Left;   UPPER GASTROINTESTINAL ENDOSCOPY  12/10/2022    Allergies: Misc. sulfonamide containing compounds and Sulfa antibiotics  Medications: Prior to Admission medications   Medication Sig Start Date End Date Taking? Authorizing Provider  acetaminophen  (TYLENOL ) 500 MG tablet Take 500 mg by mouth every 8 (eight) hours as needed for moderate pain (pain score 4-6).   Yes [provider]  fluticasone -salmeterol (WIXELA INHUB) 250-50 MCG/ACT AEPB Inhale 1 puff into the lungs 2 (two) times daily. 05/25/23  Yes Adra Alanis, FNP  gabapentin  (NEURONTIN ) 300 MG capsule Take 300 mg by mouth 3 (three) times daily.   Yes [provider]  RHOPRESSA 0.02 % SOLN Place 1 drop into both eyes at bedtime. 08/27/22  Yes [provider]  cyclobenzaprine  (FLEXERIL ) 5 MG tablet Take 1 tablet (5 mg total) by mouth at bedtime as needed for muscle spasms. 05/24/23   Adolph Hoop, PA-C  fluticasone  (FLONASE ) 50 MCG/ACT nasal spray SPRAY 2 SPRAYS INTO EACH NOSTRIL EVERY DAY Patient taking differently: Place 1 spray into both nostrils daily as needed for allergies. 01/18/22   Glade Lambert  Gordy Lauber, FNP  methocarbamol  (ROBAXIN ) 500 MG tablet Take 1 tablet (500 mg total) by mouth every 6 (six) hours as needed for muscle spasms (muscle/thigh pain). 03/22/23   Earnie Gola, PA-C  MIEBO 1.338 GM/ML SOLN Apply 1 drop to eye 4 (four) times daily. 12/06/22   [provider]  oxyCODONE  (OXY IR/ROXICODONE ) 5 MG immediate release tablet Take 1 tablet (5 mg total) by mouth every 4 (four) hours as needed  for severe pain (pain score 7-10). Do not use with tramadol . 03/22/23   Earnie Gola, PA-C  senna (SENOKOT) 8.6 MG TABS tablet Take 2 tablets (17.2 mg total) by mouth at bedtime. 03/22/23   Earnie Gola, PA-C  traMADol  (ULTRAM ) 50 MG tablet Take 50 mg by mouth at bedtime.    [provider]     Family History  Problem Relation Age of Onset   Hypertension Mother    Lung cancer Father        he smoked   Glaucoma Father    Gout Father    Alzheimer's disease Father    Hypertension Sister    Liver disease Sister    Brain cancer Cousin        glioblastoma, paternal first cousin   Colon cancer Neg Hx    Stomach cancer Neg Hx     Social History   Socioeconomic History   Marital status: Divorced    Spouse name: Not on file   Number of children: 0   Years of education: Not on file   Highest education level: Bachelor's degree (e.g., BA, AB, BS)  Occupational History   Occupation: retired  Tobacco Use   Smoking status: Former    Types: Cigarettes   Smokeless tobacco: Never   Tobacco comments:    College years  Vaping Use   Vaping status: Never Used  Substance and Sexual Activity   Alcohol use: Yes    Alcohol/week: 2.0 standard drinks of alcohol    Types: 1 Glasses of wine, 1 Cans of beer per week    Comment: 1 per day   Drug use: No   Sexual activity: Not on file  Other Topics Concern   Not on file  Social History Narrative   Not on file   Social Drivers of Health   Financial Resource Strain: Low Risk  (03/01/2023)   Overall Financial Resource Strain (CARDIA)    Difficulty of Paying Living Expenses: Not hard at all  Food Insecurity: No Food Insecurity (03/21/2023)   Hunger Vital Sign    Worried About Running Out of Food in the Last Year: Never true    Ran Out of Food in the Last Year: Never true  Transportation Needs: No Transportation Needs (03/21/2023)   PRAPARE - Administrator, Civil Service (Medical): No    Lack of Transportation  (Non-Medical): No  Physical Activity: Unknown (03/01/2023)   Exercise Vital Sign    Days of Exercise per Week: 0 days    Minutes of Exercise per Session: Not on file  Stress: No Stress Concern Present (03/01/2023)   [employer] of Occupational Health - Occupational Stress Questionnaire    Feeling of Stress : Only a little  Social Connections: Unknown (03/21/2023)   Social Connection and Isolation Panel [NHANES]    Frequency of Communication with Friends and Family: More than three times a week    Frequency of Social Gatherings with Friends and Family: More than three times a week    Attends Religious  Services: Patient declined    Active Member of Clubs or Organizations: No    Attends Engineer, structural: More than 4 times per year    Marital Status: Divorced      Review of Systems: A 12 point ROS discussed and pertinent positives are indicated in the HPI above.  All other systems are negative.  Review of Systems  Constitutional:  Negative for fever.  Respiratory:  Negative for cough and shortness of breath.   Cardiovascular:  Negative for chest pain.  Gastrointestinal:  Negative for abdominal pain, diarrhea, nausea and vomiting.  Musculoskeletal:  Negative for back pain.    Vital Signs: BP 135/85 (BP Location: Left Arm)   Pulse 76   Temp 98.3 F (36.8 C) (Oral)   Resp 16   Ht 5\' 2"  (1.575 m)   Wt 150 lb (68 kg)   SpO2 100%   BMI 27.44 kg/m   Advance Care Plan: The advanced care plan/surrogate decision maker was discussed at the time of visit and documented in the medical record.    Physical Exam HENT:     Mouth/Throat:     Mouth: Mucous membranes are moist.     Pharynx: Oropharynx is clear.  Cardiovascular:     Rate and Rhythm: Normal rate.  Pulmonary:     Effort: Pulmonary effort is normal.     Breath sounds: Normal breath sounds.  Abdominal:     General: Abdomen is flat.  Skin:    General: Skin is warm.  Neurological:     General: No focal  deficit present.     Mental Status: She is alert and oriented to person, place, and time.  Psychiatric:        Thought Content: Thought content normal.     Imaging: No results found.  Labs:  CBC: Recent Labs    08/20/22 1151 03/08/23 1342 03/22/23 0329 07/26/23 0857  WBC 7.0 8.5 16.2* 7.1  HGB 13.2 12.7 10.1* 13.7  HCT 40.1 39.0 30.3* 41.2  PLT 263.0 315.0 218 266    COAGS: Recent Labs    07/26/23 0857  INR 1.0    BMP: Recent Labs    08/20/22 1151 03/08/23 1342 03/22/23 0329 07/26/23 0857  NA 139 137 132* 139  K 4.2 4.4 4.2 3.9  CL 103 100 102 105  CO2 28 29 23 26   GLUCOSE 88 81 139* 84  BUN 11 13 15  7*  CALCIUM 9.2 9.6 8.8* 9.5  CREATININE 0.83 0.73 0.73 0.76  GFRNONAA  --   --  >60 >60    LIVER FUNCTION TESTS: Recent Labs    08/20/22 1151 03/08/23 1342  BILITOT 1.2 0.6  AST 17 15  ALT 16 11  ALKPHOS 66 68  PROT 6.7 6.8  ALBUMIN 4.0 4.2    TUMOR MARKERS: No results for input(s): "AFPTM", "CEA", "CA199", "CHROMGRNA" in the last 8760 hours.  Assessment and Plan: Bilateral sacral insufficiency fractures   Patient is a 78 y/o female with with history of GERD, arthritis, asthma, breast cancer, glaucoma, osteoporosis, neuropathy, vitamin B12 and D deficiency. Patient presents today for a bilateral sacroplasty due to bilateral sacral insufficiency fractures. Patient denies current: back pain, chest pain, shortness of breath, nausea, vomiting, abdominal pain, diarrhea, fever, and/or cough. She is NPO and has a driver.  Risks and benefits of bilateral sacroplasty were discussed with the patient including, but not limited to education regarding the natural healing process of compression fractures without intervention, bleeding, infection, cement migration which  may cause spinal cord damage, paralysis, pulmonary embolism or even death.  This interventional procedure involves the use of X-rays and because of the nature of the planned procedure, it is  possible that we will have prolonged use of X-ray fluoroscopy.  Potential radiation risks to you include (but are not limited to) the following: - A slightly elevated risk for cancer  several years later in life. This risk is typically less than 0.5% percent. This risk is low in comparison to the normal incidence of human cancer, which is 33% for women and 50% for men according to the American Cancer Society. - Radiation induced injury can include skin redness, resembling a rash, tissue breakdown / ulcers and hair loss (which can be temporary or permanent).   The likelihood of either of these occurring depends on the difficulty of the procedure and whether you are sensitive to radiation due to previous procedures, disease, or genetic conditions.   IF your procedure requires a prolonged use of radiation, you will be notified and given written instructions for further action.  It is your responsibility to monitor the irradiated area for the 2 weeks following the procedure and to notify your physician if you are concerned that you have suffered a radiation induced injury.    All of the patient's questions were answered, patient is agreeable to proceed.  Consent signed and in chart.   Thank you for this interesting consult.  I greatly enjoyed meeting DERRY ARBOGAST and look forward to participating in their care.  A copy of this report was sent to the requesting provider on this date.   Electronically Signed: Lawrance Presume, PA 07/26/2023, 10:35 AM  I spent a total of  30 Minutes   in face to face in clinical consultation, greater than 50% of which was counseling/coordinating care for Bilateral sacral insuffiencey fractures

## 2023-07-26 NOTE — Procedures (Signed)
 INR.  Status post fluoroscopic guided sacroplasty for sacral insufficiency fractures.   Bilateral approach.  Patient tolerated the procedure well.   No acute complications.    Jory Ng MD.

## 2023-08-02 DIAGNOSIS — H401122 Primary open-angle glaucoma, left eye, moderate stage: Secondary | ICD-10-CM | POA: Diagnosis not present

## 2023-08-09 ENCOUNTER — Ambulatory Visit (INDEPENDENT_AMBULATORY_CARE_PROVIDER_SITE_OTHER)

## 2023-08-09 VITALS — Ht 61.0 in | Wt 150.0 lb

## 2023-08-09 DIAGNOSIS — Z0000 Encounter for general adult medical examination without abnormal findings: Secondary | ICD-10-CM

## 2023-08-09 NOTE — Patient Instructions (Signed)
 Ruth Wolfe , Thank you for taking time out of your busy schedule to complete your Annual Wellness Visit with me. I enjoyed our conversation and look forward to speaking with you again next year. I, as well as your care team,  appreciate your ongoing commitment to your health goals. Please review the following plan we discussed and let me know if I can assist you in the future. Your Game plan/ To Do List    Referrals: If you haven't heard from the office you've been referred to, please reach out to them at the phone provided.   Follow up Visits: Next Medicare AWV with our clinical staff: 08/14/24 @ 1:50p   Have you seen your provider in the last 6 months (3 months if uncontrolled diabetes)?  Next Office Visit with your provider: 08/23/23 @ 10:20a  Clinician Recommendations:  Aim for 30 minutes of exercise or brisk walking, 6-8 glasses of water, and 5 servings of fruits and vegetables each day.       This is a list of the screening recommended for you and due dates:  Health Maintenance  Topic Date Due   COVID-19 Vaccine (6 - 2024-25 season) 10/31/2022   Flu Shot  09/30/2023   Medicare Annual Wellness Visit  08/08/2024   DTaP/Tdap/Td vaccine (3 - Td or Tdap) 03/07/2031   Pneumonia Vaccine  Completed   DEXA scan (bone density measurement)  Completed   Hepatitis C Screening  Completed   Zoster (Shingles) Vaccine  Completed   HPV Vaccine  Aged Out   Meningitis B Vaccine  Aged Out   Colon Cancer Screening  Discontinued    Advanced directives: (In Chart) A copy of your advanced directives are scanned into your chart should your provider ever need it. Advance Care Planning is important because it:  [x]  Makes sure you receive the medical care that is consistent with your values, goals, and preferences  [x]  It provides guidance to your family and loved ones and reduces their decisional burden about whether or not they are making the right decisions based on your wishes.  Follow the link  provided in your after visit summary or read over the paperwork we have mailed to you to help you started getting your Advance Directives in place. If you need assistance in completing these, please reach out to us  so that we can help you!  See attachments for Preventive Care and Fall Prevention Tips.

## 2023-08-09 NOTE — Progress Notes (Signed)
 Subjective:   Ruth Wolfe is a 78 y.o. who presents for a Medicare Wellness preventive visit.  As a reminder, Annual Wellness Visits don't include a physical exam, and some assessments may be limited, especially if this visit is performed virtually. We may recommend an in-person follow-up visit with your provider if needed.  Visit Complete: Virtual I connected with  Ruth Wolfe on 08/09/23 by a audio enabled telemedicine application and verified that I am speaking with the correct person using two identifiers.  Patient Location: Home  Provider Location: Home Office  I discussed the limitations of evaluation and management by telemedicine. The patient expressed understanding and agreed to proceed.  Vital Signs: Because this visit was a virtual/telehealth visit, some criteria may be missing or patient reported. Any vitals not documented were not able to be obtained and vitals that have been documented are patient reported.    Persons Participating in Visit: Patient.  AWV Questionnaire: Yes: Patient Medicare AWV questionnaire was completed by the patient on 08/05/23; I have confirmed that all information answered by patient is correct and no changes since this date.  Cardiac Risk Factors include: advanced age (>26men, >42 women)     Objective:     Today's Vitals   08/09/23 1356 08/09/23 1357  Weight: 150 lb (68 kg)   Height: 5\' 1"  (1.549 m)   PainSc:  0-No pain   Body mass index is 28.34 kg/m.     08/09/2023    2:08 PM 07/26/2023    9:11 AM 03/21/2023   10:18 AM 03/21/2023    6:04 AM 03/11/2023    9:52 AM 10/18/2022   10:45 AM 08/04/2022   10:20 AM  Advanced Directives  Does Patient Have a Medical Advance Directive? Yes Yes Yes Yes Yes No Yes  Type of Estate agent of [place];Living will Healthcare Power of [place];Living will Healthcare Power of [employer] of [place];Living will Healthcare Power of [employer];Living will  Living  will;Healthcare Power of Attorney  Does patient want to make changes to medical advance directive? No - Patient declined  No - Patient declined No - Patient declined No - Patient declined    Copy of Healthcare Power of Attorney in Chart? Yes - validated most recent copy scanned in chart (See row information) No - copy requested No - copy requested No - copy requested   No - copy requested    Current Medications (verified) Outpatient Encounter Medications as of 08/09/2023  Medication Sig   omeprazole  (PRILOSEC) 40 MG capsule Take 40 mg by mouth daily.   acetaminophen  (TYLENOL ) 500 MG tablet Take 500 mg by mouth every 8 (eight) hours as needed for moderate pain (pain score 4-6).   cyclobenzaprine  (FLEXERIL ) 5 MG tablet Take 1 tablet (5 mg total) by mouth at bedtime as needed for muscle spasms. (Patient not taking: Reported on 08/09/2023)   fluticasone  (FLONASE ) 50 MCG/ACT nasal spray SPRAY 2 SPRAYS INTO EACH NOSTRIL EVERY DAY (Patient taking differently: Place 1 spray into both nostrils daily as needed for allergies.)   fluticasone -salmeterol (WIXELA INHUB) 250-50 MCG/ACT AEPB Inhale 1 puff into the lungs 2 (two) times daily.   gabapentin  (NEURONTIN ) 300 MG capsule Take 300 mg by mouth 3 (three) times daily.   methocarbamol  (ROBAXIN ) 500 MG tablet Take 1 tablet (500 mg total) by mouth every 6 (six) hours as needed for muscle spasms (muscle/thigh pain). (Patient not taking: Reported on 08/09/2023)   MIEBO 1.338 GM/ML SOLN Apply 1 drop to eye  4 (four) times daily.   oxyCODONE  (OXY IR/ROXICODONE ) 5 MG immediate release tablet Take 1 tablet (5 mg total) by mouth every 4 (four) hours as needed for severe pain (pain score 7-10). Do not use with tramadol . (Patient not taking: Reported on 08/09/2023)   RHOPRESSA 0.02 % SOLN Place 1 drop into both eyes at bedtime. (Patient not taking: Reported on 08/09/2023)   senna (SENOKOT) 8.6 MG TABS tablet Take 2 tablets (17.2 mg total) by mouth at bedtime. (Patient not  taking: Reported on 08/09/2023)   traMADol  (ULTRAM ) 50 MG tablet Take 50 mg by mouth at bedtime. (Patient not taking: Reported on 08/09/2023)   No facility-administered encounter medications on file as of 08/09/2023.    Allergies (verified) Misc. sulfonamide containing compounds and Sulfa antibiotics   History: Past Medical History:  Diagnosis Date   Allergy  2013   Arthritis 1980   Asthma 2000   Breast cancer (HCC) 09/22/2021   DCIS   Bruit    Per patient   Cataracts, both eyes 2021   surgery 2022   Colon polyps    Environmental allergies 2013   Fibromyalgia    GERD (gastroesophageal reflux disease) 2013   Glaucoma 2016   POAG OU   History of hiatal hernia    Hyperlipidemia 2014   Hypertensive retinopathy 2021   OU   Macular degeneration 2014   Neuropathy 2013   legs and arms   Osteopenia 2000   Osteoporosis    Paralyzed vocal cords 2013   Per patient   Tongue atrophy 2013   Per patient r/t paralyzed vocal cords   Vitamin B12 deficiency 2021   Vitamin D  deficiency 2021   Past Surgical History:  Procedure Laterality Date   APPENDECTOMY  1981   with hysterectomy   BREAST LUMPECTOMY WITH RADIOACTIVE SEED LOCALIZATION Left 10/28/2021   Procedure: LEFT BREAST SEED LUMPECTOMY;  Surgeon: Sim Dryer, MD;  Location: [facility];  Service: General;  Laterality: Left;   CATARACT EXTRACTION Bilateral    CERVICAL SPINE SURGERY  01/30/2020   Per patient   IR SACROPLASTY BILATERAL  07/26/2023   TOTAL ABDOMINAL HYSTERECTOMY  1981   TOTAL HIP ARTHROPLASTY Left 03/21/2023   Procedure: TOTAL HIP ARTHROPLASTY ANTERIOR APPROACH;  Surgeon: Claiborne Crew, MD;  Location: [facility];  Service: Orthopedics;  Laterality: Left;   UPPER GASTROINTESTINAL ENDOSCOPY  12/10/2022   Family History  Problem Relation Age of Onset   Hypertension Mother    Lung cancer Father        he smoked   Glaucoma Father    Gout Father    Alzheimer's disease Father    Hypertension Sister    Liver disease  Sister    Brain cancer Cousin        glioblastoma, paternal first cousin   Colon cancer Neg Hx    Stomach cancer Neg Hx    Social History   Socioeconomic History   Marital status: Divorced    Spouse name: Not on file   Number of children: 0   Years of education: Not on file   Highest education level: Bachelor's degree (e.g., BA, AB, BS)  Occupational History   Occupation: retired  Tobacco Use   Smoking status: Former    Types: Cigarettes   Smokeless tobacco: Never   Tobacco comments:    College years  Vaping Use   Vaping status: Never Used  Substance and Sexual Activity   Alcohol use: Yes    Alcohol/week: 2.0 standard drinks of alcohol  Types: 1 Glasses of wine, 1 Cans of beer per week    Comment: 1 per day   Drug use: No   Sexual activity: Not on file  Other Topics Concern   Not on file  Social History Narrative   Not on file   Social Drivers of Health   Financial Resource Strain: Low Risk  (08/09/2023)   Overall Financial Resource Strain (CARDIA)    Difficulty of Paying Living Expenses: Not hard at all  Food Insecurity: No Food Insecurity (08/09/2023)   Hunger Vital Sign    Worried About Running Out of Food in the Last Year: Never true    Ran Out of Food in the Last Year: Never true  Transportation Needs: No Transportation Needs (08/09/2023)   PRAPARE - Administrator, Civil Service (Medical): No    Lack of Transportation (Non-Medical): No  Physical Activity: Inactive (08/09/2023)   Exercise Vital Sign    Days of Exercise per Week: 0 days    Minutes of Exercise per Session: 0 min  Stress: No Stress Concern Present (08/09/2023)   [employer] of Occupational Health - Occupational Stress Questionnaire    Feeling of Stress : Not at all  Social Connections: Moderately Integrated (08/09/2023)   Social Connection and Isolation Panel [NHANES]    Frequency of Communication with Friends and Family: More than three times a week    Frequency of Social  Gatherings with Friends and Family: More than three times a week    Attends Religious Services: More than 4 times per year    Active Member of [employer] or Organizations: Yes    Attends Banker Meetings: More than 4 times per year    Marital Status: Widowed    Tobacco Counseling Counseling given: Not Answered Tobacco comments: College years    Clinical Intake:  Pre-visit preparation completed: Yes  Pain : No/denies pain Pain Score: 0-No pain     BMI - recorded: 28.34 Nutritional Status: BMI > 30  Obese Nutritional Risks: None Diabetes: No  Lab Results  Component Value Date   HGBA1C 5.6 08/20/2022     How often do you need to have someone help you when you read instructions, pamphlets, or other written materials from your doctor or pharmacy?: 1 - Never  Interpreter Needed?: No  Information entered by :: Farris Hong LPN   Activities of Daily Living     08/09/2023    2:07 PM 08/05/2023    7:57 AM  In your present state of health, do you have any difficulty performing the following activities:  Hearing? 0 0  Vision? 0 0  Difficulty concentrating or making decisions? 0 0  Walking or climbing stairs? 0 0  Dressing or bathing? 0 0  Doing errands, shopping? 0 0  Preparing Food and eating ? N N  Using the Toilet? N N  In the past six months, have you accidently leaked urine? N N  Do you have problems with loss of bowel control? N N  Managing your Medications? N N  Managing your Finances? N N  Housekeeping or managing your Housekeeping? N N    Patient Care Team: Adra Alanis, FNP as PCP - General (Internal Medicine) Sim Dryer, MD as Consulting Physician (General Surgery) Cameron Cea, MD as Consulting Physician (Hematology and Oncology) Retta Caster, MD as Consulting Physician (Radiation Oncology)  I have updated your Care Teams any recent Medical Services you may have received from other providers in the past year.  Assessment:     This is a routine wellness examination for [facility].  Hearing/Vision screen Hearing Screening - Comments:: Denies hearing difficulties   Vision Screening - Comments:: Wears rx glasses - up to date with routine eye exams with  Dr Carloyn Chi   Goals Addressed             This Visit's Progress    Increase physical activity       Remain active.       Depression Screen     08/09/2023    2:06 PM 08/20/2022   11:07 AM 08/04/2022   10:26 AM 11/20/2021    3:54 PM 07/07/2021    9:33 AM 07/02/2021   11:50 AM 06/16/2021    1:43 PM  PHQ 2/9 Scores  PHQ - 2 Score 0 0 0 0 0 0 0    Fall Risk     08/09/2023    2:08 PM 08/05/2023    7:57 AM 08/20/2022   11:07 AM 08/04/2022   10:22 AM 11/20/2021    3:54 PM  Fall Risk   Falls in the past year? 0 0 0 0 0  Number falls in past yr: 0 0 0 0 0  Injury with Fall? 0 0 0 0 0  Risk for fall due to : No Fall Risks  No Fall Risks No Fall Risks No Fall Risks  Follow up Falls evaluation completed  Falls evaluation completed Falls evaluation completed Falls evaluation completed    MEDICARE RISK AT HOME:  Medicare Risk at Home Any stairs in or around the home?: Yes If so, are there any without handrails?: No Home free of loose throw rugs in walkways, pet beds, electrical cords, etc?: Yes Adequate lighting in your home to reduce risk of falls?: Yes Life alert?: No Use of a cane, walker or w/c?: No Grab bars in the bathroom?: No Shower chair or bench in shower?: No Elevated toilet seat or a handicapped toilet?: No  TIMED UP AND GO:  Was the test performed?  No  Cognitive Function: 6CIT completed        08/09/2023    2:08 PM 08/04/2022   10:39 AM 07/02/2021   11:52 AM  6CIT Screen  What Year? 0 points 0 points 0 points  What month? 0 points 0 points 0 points  What time? 0 points 0 points 0 points  Count back from 20 0 points 0 points 0 points  Months in reverse 0 points 0 points 0 points  Repeat phrase 0 points 0 points 2 points  Total Score 0 points  0 points 2 points    Immunizations Immunization History  Administered Date(s) Administered   Influenza, High Dose Seasonal PF 11/08/2016, 12/10/2019   Influenza-Unspecified 12/14/2015, 11/29/2017, 12/23/2020   PFIZER(Purple Top)SARS-COV-2 Vaccination 03/15/2019, 04/05/2019, 11/25/2019, 06/19/2020   PNEUMOCOCCAL CONJUGATE-20 03/03/2021   Pfizer Covid-19 Vaccine Bivalent Booster 58yrs & up 11/19/2020   Pneumococcal Polysaccharide-23 08/18/2010, 10/13/2012   Tdap 10/18/2010, 03/06/2021   Zoster Recombinant(Shingrix) 10/05/2018, 12/18/2018    Screening Tests Health Maintenance  Topic Date Due   COVID-19 Vaccine (6 - 2024-25 season) 10/31/2022   INFLUENZA VACCINE  09/30/2023   Medicare Annual Wellness (AWV)  08/08/2024   DTaP/Tdap/Td (3 - Td or Tdap) 03/07/2031   Pneumonia Vaccine 66+ Years old  Completed   DEXA SCAN  Completed   Hepatitis C Screening  Completed   Zoster Vaccines- Shingrix  Completed   HPV VACCINES  Aged Out   Meningococcal B Vaccine  Aged  Out   Colonoscopy  Discontinued    Health Maintenance  Health Maintenance Due  Topic Date Due   COVID-19 Vaccine (6 - 2024-25 season) 10/31/2022   Health Maintenance Items Addressed:   Additional Screening:  Vision Screening: Recommended annual ophthalmology exams for early detection of glaucoma and other disorders of the eye. Would you like a referral to an eye doctor? No    Dental Screening: Recommended annual dental exams for proper oral hygiene  Community Resource Referral / Chronic Care Management: CRR required this visit?  No   CCM required this visit?  No   Plan:    I have personally reviewed and noted the following in the patient's chart:   Medical and social history Use of alcohol, tobacco or illicit drugs  Current medications and supplements including opioid prescriptions. Patient is not currently taking opioid prescriptions. Functional ability and status Nutritional status Physical  activity Advanced directives List of other physicians Hospitalizations, surgeries, and ER visits in previous 12 months Vitals Screenings to include cognitive, depression, and falls Referrals and appointments  In addition, I have reviewed and discussed with patient certain preventive protocols, quality metrics, and best practice recommendations. A written personalized care plan for preventive services as well as general preventive health recommendations were provided to patient.   Dewayne Ford, LPN   1/61/0960   After Visit Summary: (MyChart) Due to this being a telephonic visit, the after visit summary with patients personalized plan was offered to patient via MyChart   Notes: Nothing significant to report at this time.

## 2023-08-20 DIAGNOSIS — M8448XG Pathological fracture, other site, subsequent encounter for fracture with delayed healing: Secondary | ICD-10-CM | POA: Diagnosis not present

## 2023-08-20 DIAGNOSIS — M5410 Radiculopathy, site unspecified: Secondary | ICD-10-CM | POA: Diagnosis not present

## 2023-08-20 DIAGNOSIS — M5416 Radiculopathy, lumbar region: Secondary | ICD-10-CM | POA: Diagnosis not present

## 2023-08-23 ENCOUNTER — Other Ambulatory Visit: Payer: Self-pay | Admitting: Family

## 2023-08-23 ENCOUNTER — Encounter: Payer: Self-pay | Admitting: Family

## 2023-08-23 ENCOUNTER — Ambulatory Visit (INDEPENDENT_AMBULATORY_CARE_PROVIDER_SITE_OTHER): Payer: Medicare Other | Admitting: Family

## 2023-08-23 ENCOUNTER — Other Ambulatory Visit (HOSPITAL_COMMUNITY): Payer: Self-pay | Admitting: Interventional Radiology

## 2023-08-23 VITALS — BP 138/86 | HR 86 | Ht 61.0 in | Wt 146.4 lb

## 2023-08-23 DIAGNOSIS — R799 Abnormal finding of blood chemistry, unspecified: Secondary | ICD-10-CM | POA: Diagnosis not present

## 2023-08-23 DIAGNOSIS — R5383 Other fatigue: Secondary | ICD-10-CM | POA: Diagnosis not present

## 2023-08-23 DIAGNOSIS — Z13220 Encounter for screening for lipoid disorders: Secondary | ICD-10-CM | POA: Diagnosis not present

## 2023-08-23 DIAGNOSIS — E559 Vitamin D deficiency, unspecified: Secondary | ICD-10-CM

## 2023-08-23 DIAGNOSIS — R7309 Other abnormal glucose: Secondary | ICD-10-CM | POA: Diagnosis not present

## 2023-08-23 DIAGNOSIS — L819 Disorder of pigmentation, unspecified: Secondary | ICD-10-CM

## 2023-08-23 DIAGNOSIS — M79606 Pain in leg, unspecified: Secondary | ICD-10-CM

## 2023-08-23 DIAGNOSIS — M810 Age-related osteoporosis without current pathological fracture: Secondary | ICD-10-CM

## 2023-08-23 DIAGNOSIS — Z87311 Personal history of (healed) other pathological fracture: Secondary | ICD-10-CM

## 2023-08-23 DIAGNOSIS — E538 Deficiency of other specified B group vitamins: Secondary | ICD-10-CM | POA: Diagnosis not present

## 2023-08-23 LAB — CBC WITH DIFFERENTIAL/PLATELET
Basophils Absolute: 0 10*3/uL (ref 0.0–0.1)
Basophils Relative: 0.4 % (ref 0.0–3.0)
Eosinophils Absolute: 0.5 10*3/uL (ref 0.0–0.7)
Eosinophils Relative: 6.3 % — ABNORMAL HIGH (ref 0.0–5.0)
HCT: 39.1 % (ref 36.0–46.0)
Hemoglobin: 12.8 g/dL (ref 12.0–15.0)
Lymphocytes Relative: 18.7 % (ref 12.0–46.0)
Lymphs Abs: 1.5 10*3/uL (ref 0.7–4.0)
MCHC: 32.9 g/dL (ref 30.0–36.0)
MCV: 99.2 fl (ref 78.0–100.0)
Monocytes Absolute: 0.9 10*3/uL (ref 0.1–1.0)
Monocytes Relative: 10.6 % (ref 3.0–12.0)
Neutro Abs: 5.3 10*3/uL (ref 1.4–7.7)
Neutrophils Relative %: 64 % (ref 43.0–77.0)
Platelets: 271 10*3/uL (ref 150.0–400.0)
RBC: 3.94 Mil/uL (ref 3.87–5.11)
RDW: 14.8 % (ref 11.5–15.5)
WBC: 8.3 10*3/uL (ref 4.0–10.5)

## 2023-08-23 LAB — COMPREHENSIVE METABOLIC PANEL WITH GFR
ALT: 12 U/L (ref 0–35)
AST: 17 U/L (ref 0–37)
Albumin: 4.4 g/dL (ref 3.5–5.2)
Alkaline Phosphatase: 95 U/L (ref 39–117)
BUN: 13 mg/dL (ref 6–23)
CO2: 30 meq/L (ref 19–32)
Calcium: 9.7 mg/dL (ref 8.4–10.5)
Chloride: 100 meq/L (ref 96–112)
Creatinine, Ser: 0.71 mg/dL (ref 0.40–1.20)
GFR: 80.76 mL/min (ref >60.00)
Glucose, Bld: 88 mg/dL (ref 70–99)
Potassium: 4.5 meq/L (ref 3.5–5.1)
Sodium: 138 meq/L (ref 135–145)
Total Bilirubin: 1.1 mg/dL (ref 0.2–1.2)
Total Protein: 7 g/dL (ref 6.0–8.3)

## 2023-08-23 LAB — LIPID PANEL
Cholesterol: 263 mg/dL — ABNORMAL HIGH (ref 0–200)
HDL: 99.2 mg/dL (ref >39.00)
LDL Cholesterol: 143 mg/dL — ABNORMAL HIGH (ref 0–99)
NonHDL: 163.53
Total CHOL/HDL Ratio: 3
Triglycerides: 104 mg/dL (ref 0.0–149.0)
VLDL: 20.8 mg/dL (ref 0.0–40.0)

## 2023-08-23 LAB — HEMOGLOBIN A1C: Hgb A1c MFr Bld: 5.7 % (ref 4.6–6.5)

## 2023-08-23 LAB — VITAMIN D 25 HYDROXY (VIT D DEFICIENCY, FRACTURES): VITD: 61.03 ng/mL (ref 30.00–100.00)

## 2023-08-23 LAB — VITAMIN B12: Vitamin B-12: 612 pg/mL (ref 211–911)

## 2023-08-23 NOTE — Progress Notes (Signed)
 Ruth Wolfe is a 78 y.o. 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     fluticasone  (FLONASE ) 50 MCG/ACT nasal spray SPRAY 2 SPRAYS INTO EACH NOSTRIL EVERY DAY (Patient taking differently: Place 1 spray into both nostrils daily as needed for allergies.) 48 mL 2   fluticasone -salmeterol (WIXELA INHUB) 250-50 MCG/ACT AEPB Inhale 1 puff into the lungs in the morning and at bedtime. 180 each 1   gabapentin  (NEURONTIN ) 300 MG capsule Take 300 mg by mouth 3 (three) times daily.     omeprazole  (PRILOSEC) 40 MG capsule Take 40 mg by mouth daily.     No current facility-administered medications for this visit.    Allergies: Misc. sulfonamide containing compounds and Sulfa antibiotics  Past Medical History:  Diagnosis Date   Allergy  2013   Arthritis 1980   Asthma 2000   Breast cancer (HCC) 09/22/2021   DCIS   Bruit    Per patient   Cataracts, both eyes 2021   surgery 2022   Colon polyps     Environmental allergies 2013   Fibromyalgia    GERD (gastroesophageal reflux disease) 2013   Glaucoma 2016   POAG OU   History of hiatal hernia    Hyperlipidemia 2014   Hypertensive retinopathy 2021   OU   Macular degeneration 2014   Neuropathy 2013   legs and arms   Osteopenia 2000   Osteoporosis    Paralyzed vocal cords 2013   Per patient   Tongue atrophy 2013   Per patient r/t paralyzed vocal cords   Vitamin B12 deficiency 2021   Vitamin D  deficiency 2021    Past Surgical History:  Procedure Laterality Date   APPENDECTOMY  1981   with hysterectomy   BREAST LUMPECTOMY WITH RADIOACTIVE SEED LOCALIZATION Left 10/28/2021   Procedure: LEFT BREAST SEED LUMPECTOMY;  Surgeon: Vanderbilt Ned, MD;  Location: [facility] OR;  Service: General;  Laterality: Left;   CATARACT EXTRACTION Bilateral    CERVICAL SPINE SURGERY  01/30/2020   Per patient   IR SACROPLASTY BILATERAL  07/26/2023   TOTAL ABDOMINAL HYSTERECTOMY  1981   TOTAL HIP ARTHROPLASTY Left 03/21/2023   Procedure: TOTAL HIP ARTHROPLASTY ANTERIOR APPROACH;  Surgeon: Ernie Cough, MD;  Location: [facility];  Service: Orthopedics;  Laterality: Left;   UPPER GASTROINTESTINAL ENDOSCOPY  12/10/2022    Family History  Problem Relation Age of Onset   Hypertension Mother    Lung cancer Father        he smoked   Glaucoma Father  Gout Father    Alzheimer's disease Father    Hypertension Sister    Liver disease Sister    Brain cancer Cousin        glioblastoma, paternal first cousin   Colon cancer Neg Hx    Stomach cancer Neg Hx     Social History   Tobacco Use   Smoking status: Former    Types: Cigarettes   Smokeless tobacco: Never   Tobacco comments:    College years  Substance Use Topics   Alcohol use: Yes    Alcohol/week: 2.0 standard drinks of alcohol    Types: 1 Glasses of wine, 1 Cans of beer per week    Comment: 1 per day    Subjective:   Presents for yearly follow up on chronic care needs; since last OV here, has  had hip replacement; also working with orthopedics to help with chronic low back pain; has recently been treated by IR for bilateral sacral fracture- frustrated about progression;   Osteoporosis was diagnosed and discussed with her hematologist in 2024- patient deferred medication at that time; she was told by her orthopedist that she does need to be on some type of medication;   Objective:  Vitals:   08/23/23 1009  BP: 138/86  Pulse: 86  SpO2: 97%  Weight: 146 lb 6.4 oz (66.4 kg)  Height: 5' 1 (1.549 m)    General: Well developed, well nourished, in no acute distress  Skin : Warm and dry.  Head: Normocephalic and atraumatic  Eyes: Sclera and conjunctiva clear; pupils round and reactive to light; extraocular movements intact  Ears: External normal; canals clear; tympanic membranes normal  Oropharynx: Pink, supple. No suspicious lesions  Neck: Supple without thyromegaly, adenopathy  Lungs: Respirations unlabored; clear to auscultation bilaterally without wheeze, rales, rhonchi  CVS exam: normal rate and regular rhythm.  Musculoskeletal: No deformities; no active joint inflammation; walks with favored gait Extremities: No edema, cyanosis, clubbing ;  Vessels: Symmetric bilaterally  Neurologic: Alert and oriented; speech intact; face symmetrical; moves all extremities well; CNII-XII intact without focal deficit   Assessment:  1. Discoloration of skin of multiple sites of lower extremity   2. Vitamin D  deficiency   3. Osteoporosis, unspecified osteoporosis type, unspecified pathological fracture presence   4. Lipid screening   5. B12 deficiency   6. Elevated glucose   7. Other fatigue   8. Abnormal finding of blood chemistry, unspecified     Plan:  Refer to dermatology; Discussed DEXA results from 01/2022- patient had deferred medication in the past but does agree to treatment; will refer to osteoporosis clinic to discuss Prolia;  4.   Check lipid panel; 5.   Check B12  level; 6.   Check hgba1c; 7.   Check CBC today;   Time spent 35+ minutes  No follow-ups on file.  Orders Placed This Encounter  Procedures   CBC with Differential/Platelet   Comp Met (CMET)   Lipid panel   Hemoglobin A1c   B12   Vitamin D  (25 hydroxy)   Ambulatory referral to Dermatology    Referral Priority:   Routine    Referral Type:   Consultation    Referral Reason:   Specialty Services Required    Requested Specialty:   Dermatology    Number of Visits Requested:   1    Requested Prescriptions    No prescriptions requested or ordered in this encounter

## 2023-08-23 NOTE — Patient Instructions (Signed)
 Please follow up with Dr. Monna about your back; let me know if you need a 2nd opinion with a different pain specialist.  We are going to get you on Prolia ( if insurance approves) to treat the osteoporsis;

## 2023-08-24 ENCOUNTER — Ambulatory Visit: Payer: Self-pay | Admitting: Family

## 2023-08-27 ENCOUNTER — Encounter (HOSPITAL_COMMUNITY): Payer: Self-pay | Admitting: Interventional Radiology

## 2023-08-29 ENCOUNTER — Ambulatory Visit (HOSPITAL_COMMUNITY)

## 2023-08-31 ENCOUNTER — Telehealth: Payer: Self-pay

## 2023-08-31 ENCOUNTER — Telehealth (HOSPITAL_COMMUNITY): Payer: Self-pay

## 2023-08-31 NOTE — Telephone Encounter (Signed)
 Informed pt that Dr. Dolphus will not be able to see her regarding her current symptoms. [facility] is booked out on MRI scheduling. I did reach out to the clinic to see if they can get her in but they would need an mri first. I called her PCP to find out if they will order. They will give pt a call if they are able to schedule. AB

## 2023-08-31 NOTE — Telephone Encounter (Signed)
-----   Message from [facility] GORMAN LAMP sent at 08/31/2023  2:26 PM EDT ----- Regarding: RE: Back pain sx Dev says he can't do anything further for her. She needs to see her PCP or Orthopedic. He cannot fix her symptoms. ----- Message ----- From: [name] Sent: 08/30/2023   3:47 PM EDT To: [name], PA-C Subject: FW: Back pain sx                                ----- Message ----- From: Ruth Wolfe Sent: 08/30/2023  12:55 PM EDT To: [name], PA-C Subject: Back pain sx                                   Hi,   Pt is s/p Sacroplasty w/Dr. Dolphus. She is still having lots of back pain and Dr. Bonner office has referred her back to us . I have her scheduled for a consult to see him next Thursday (that was the earliest). I tried to get Dr. Bonner office to schedule her another MRI but they are booking out for months. Would Dev be ok if we order her an MRI prior to her consult or does he want to wait and see her first?  She said that she can't stand straight, feels like pins and needles in her right foot, pain shooting down her right leg and pain that wraps around her waist.   Thanks,  [name]

## 2023-08-31 NOTE — Telephone Encounter (Signed)
 Copied from CRM [id number]. Topic: Clinical - Request for Lab/Test Order >> Aug 31, 2023  4:20 PM [place] E wrote: Reason for CRM: Rosina from [facility] called asking if patient's PCP would be able to have an MRI ordered for the patient. Patient is currently seeing Dr. Dolphus, but he will be leaving beginning of August, so instead of scheduling patient with him (as by the time results come back he will be gone), Rosina stated patient needs MRI through PCP to see if there is another fracture. Patient prefers this order go to [facility]. Okay to call the patient back to confirm, but callback number for Rosina is [phone].

## 2023-09-05 ENCOUNTER — Other Ambulatory Visit: Payer: Self-pay | Admitting: Family

## 2023-09-05 DIAGNOSIS — S32000S Wedge compression fracture of unspecified lumbar vertebra, sequela: Secondary | ICD-10-CM

## 2023-09-06 ENCOUNTER — Other Ambulatory Visit: Payer: Self-pay | Admitting: Family

## 2023-09-06 DIAGNOSIS — E785 Hyperlipidemia, unspecified: Secondary | ICD-10-CM

## 2023-09-06 NOTE — Telephone Encounter (Signed)
 Called Rosina back to inform her the MRI will be ordered for pt. Advised ashley to give the office a call back for any further questions or concerns.

## 2023-09-07 ENCOUNTER — Ambulatory Visit (HOSPITAL_COMMUNITY)

## 2023-09-07 ENCOUNTER — Encounter (HOSPITAL_COMMUNITY): Payer: Self-pay

## 2023-09-09 ENCOUNTER — Ambulatory Visit (HOSPITAL_COMMUNITY)
Admission: RE | Admit: 2023-09-09 | Discharge: 2023-09-09 | Disposition: A | Discharge status: Home / Self Care | Source: Ambulatory Visit | Attending: Family | Admitting: Family

## 2023-09-09 ENCOUNTER — Ambulatory Visit (HOSPITAL_COMMUNITY)
Admission: RE | Admit: 2023-09-09 | Discharge: 2023-09-09 | Disposition: A | Discharge status: Home / Self Care | Payer: Self-pay | Source: Ambulatory Visit | Attending: Family | Admitting: Family

## 2023-09-09 DIAGNOSIS — E785 Hyperlipidemia, unspecified: Secondary | ICD-10-CM | POA: Insufficient documentation

## 2023-09-09 DIAGNOSIS — H401122 Primary open-angle glaucoma, left eye, moderate stage: Secondary | ICD-10-CM | POA: Diagnosis not present

## 2023-09-09 DIAGNOSIS — S32000S Wedge compression fracture of unspecified lumbar vertebra, sequela: Secondary | ICD-10-CM | POA: Insufficient documentation

## 2023-09-09 DIAGNOSIS — H401111 Primary open-angle glaucoma, right eye, mild stage: Secondary | ICD-10-CM | POA: Diagnosis not present

## 2023-09-09 NOTE — Progress Notes (Signed)
 [facility] - Clinic Note  09/13/2023     CHIEF COMPLAINT Patient presents for Retina Follow Up    HISTORY OF PRESENT ILLNESS: Ruth Wolfe is a 78 y.o. female who presents to the clinic today for:   HPI     Retina Follow Up   Patient presents with  Other.  In both eyes.  This started 6 months ago.  I, the attending physician,  performed the HPI with the patient and updated documentation appropriately.        Comments   Patient here for 6 months retina follow up for VMT OU. Patient states vision about the same. No eye pain. Using Brimonidine BID OS. Had glaucoma laser OU done.      Last edited by Yulissa Needham, MD on 09/16/2023  1:23 AM.    Patient states when she was reading the eye chart, she felt like her right eye did well, but her left eye was blurry  Referring physician: Jason Leita Repine, FNP [address] [place],  [place] [address]  HISTORICAL INFORMATION:  Selected notes from the MEDICAL RECORD NUMBER Referred by Dr. Medford Ferrier for concern of VMT OU LEE: 03.18.21 MICHAELL Ferrier) [BCVA: OD: 20/30-- OS: 20/50] Ocular Hx-POAG OU, VMT OU, DES OU, cataracts OU, HTN ret OU PMH-HLD   CURRENT MEDICATIONS: No current outpatient medications on file. (Ophthalmic Drugs)   No current facility-administered medications for this visit. (Ophthalmic Drugs)   Current Outpatient Medications (Other)  Medication Sig   acetaminophen  (TYLENOL ) 500 MG tablet Take 500 mg by mouth every 8 (eight) hours as needed for moderate pain (pain score 4-6).   fluticasone  (FLONASE ) 50 MCG/ACT nasal spray SPRAY 2 SPRAYS INTO EACH NOSTRIL EVERY DAY   fluticasone -salmeterol (WIXELA INHUB) 250-50 MCG/ACT AEPB Inhale 1 puff into the lungs in the morning and at bedtime.   gabapentin  (NEURONTIN ) 300 MG capsule Take 300 mg by mouth 3 (three) times daily.   omeprazole  (PRILOSEC) 40 MG capsule Take 40 mg by mouth daily.   No current facility-administered  medications for this visit. (Other)   REVIEW OF SYSTEMS: ROS   Positive for: Gastrointestinal, Musculoskeletal, Eyes, Respiratory Negative for: Constitutional, Neurological, Skin, Genitourinary, HENT, Endocrine, Cardiovascular, Psychiatric, Allergic/Imm, Heme/Lymph Last edited by Orval Asberry RAMAN, COA on 09/13/2023  2:12 PM.        ALLERGIES Allergies  Allergen Reactions   Misc. Sulfonamide Containing Compounds Other (See Comments)   Sulfa Antibiotics     Wheezing, swelling face and ears   PAST MEDICAL HISTORY Past Medical History:  Diagnosis Date   Allergy  2013   Arthritis 1980   Asthma 2000   Breast cancer (HCC) 09/22/2021   DCIS   Bruit    Per patient   Cataracts, both eyes 2021   surgery 2022   Colon polyps    Environmental allergies 2013   Fibromyalgia    GERD (gastroesophageal reflux disease) 2013   Glaucoma 2016   POAG OU   History of hiatal hernia    Hyperlipidemia 2014   Hypertensive retinopathy 2021   OU   Macular degeneration 2014   Neuropathy 2013   legs and arms   Osteopenia 2000   Osteoporosis    Paralyzed vocal cords 2013   Per patient   Tongue atrophy 2013   Per patient r/t paralyzed vocal cords   Vitamin B12 deficiency 2021   Vitamin D  deficiency 2021   Past Surgical History:  Procedure Laterality Date  APPENDECTOMY  1981   with hysterectomy   BREAST LUMPECTOMY WITH RADIOACTIVE SEED LOCALIZATION Left 10/28/2021   Procedure: LEFT BREAST SEED LUMPECTOMY;  Surgeon: Vanderbilt Ned, MD;  Location: [facility];  Service: General;  Laterality: Left;   CATARACT EXTRACTION Bilateral    CERVICAL SPINE SURGERY  01/30/2020   Per patient   IR SACROPLASTY BILATERAL  07/26/2023   TOTAL ABDOMINAL HYSTERECTOMY  1981   TOTAL HIP ARTHROPLASTY Left 03/21/2023   Procedure: TOTAL HIP ARTHROPLASTY ANTERIOR APPROACH;  Surgeon: Ernie Cough, MD;  Location: [facility];  Service: Orthopedics;  Laterality: Left;   UPPER GASTROINTESTINAL ENDOSCOPY  12/10/2022   FAMILY  HISTORY Family History  Problem Relation Age of Onset   Hypertension Mother    Lung cancer Father        he smoked   Glaucoma Father    Gout Father    Alzheimer's disease Father    Hypertension Sister    Liver disease Sister    Brain cancer Cousin        glioblastoma, paternal first cousin   Colon cancer Neg Hx    Stomach cancer Neg Hx    SOCIAL HISTORY Social History   Tobacco Use   Smoking status: Former    Types: Cigarettes   Smokeless tobacco: Never   Tobacco comments:    College years  Vaping Use   Vaping status: Never Used  Substance Use Topics   Alcohol use: Yes    Alcohol/week: 2.0 standard drinks of alcohol    Types: 1 Glasses of wine, 1 Cans of beer per week    Comment: 1 per day   Drug use: No       OPHTHALMIC EXAM:  Base Eye Exam     Visual Acuity (Snellen - Linear)       Right Left   Dist cc 20/25 20/40   Dist ph cc  NI    Correction: Glasses         Tonometry (Tonopen, 2:10 PM)       Right Left   Pressure 14 14         Pupils       Dark Light Shape React APD   Right 3 2 Round Brisk None   Left 3 2 Round Brisk None         Visual Fields (Counting fingers)       Left Right    Full Full         Extraocular Movement       Right Left    Full, Ortho Full, Ortho         Neuro/Psych     Oriented x3: Yes   Mood/Affect: Normal         Dilation     Both eyes: 1.0% Mydriacyl, 2.5% Phenylephrine  @ 2:10 PM           Slit Lamp and Fundus Exam     Slit Lamp Exam       Right Left   Lids/Lashes Mild Dermatochalasis - upper lid, mild MGD Mild Dermatochalasis - upper lid   Conjunctiva/Sclera White and quiet,1+ injection White and quiet   Cornea Arcus, 2+ Punctate epithelial erosions, well healed temporal cataract wounds, faint iron line inferiorly Arcus, 1-2+ Punctate epithelial erosions, fine endo pigment, well healed cataract wounds, faint iron line inferiorly, tear film debris   Anterior Chamber deep and clear,  narrow temporal angle, no cell or flare deep and clear, narrow temporal angle   Iris Round and  dilated Round and dilated   Lens PC IOL in good position PC IOL in good position   Anterior Vitreous Posterior vitreous detachment, vitreous syneresis Vitreous syneresis         Fundus Exam       Right Left   Disc Tilted disc, +cupping, temporal PPA, Sharp rim, thin inferior rim Tilted disc, +cupping, temporal PPA, mild Pallor, Sharp rim   C/D Ratio 0.75 0.6   Macula Flat, Blunted foveal reflex, central Cystic changes -- improved, Drusen, RPE mottling and clumping, no heme Flat, Blunted foveal reflex, central macular cyst -- stably improved, Drusen, RPE mottling and clumping, no heme, central vitelliform like lesion -- improved to focal pigment clump   Vessels attenuated, mild tortuosity attenuated, Tortuous   Periphery Attached, peripheral drusen, reticular degeneration, No heme Attached, peripheral drusen, reticular degeneration, No heme           Refraction     Wearing Rx       Sphere Cylinder Axis Add   Right -1.00 +1.25 022 +2.50   Left -1.50 +2.50 158 +2.50    Type: PAL           IMAGING AND PROCEDURES  Imaging and Procedures for @TODAY @  OCT, Retina - OU - Both Eyes       Right Eye Quality was borderline. Central Foveal Thickness: 303. Progression has improved. Findings include no SRF, abnormal foveal contour, myopic contour, retinal drusen , intraretinal fluid, vitreous traction (VMT with mild interval improvement in cystic changes -- VMT releasing).   Left Eye Quality was good. Central Foveal Thickness: 232. Progression has been stable. Findings include normal foveal contour, no IRF, no SRF, retinal drusen , vitreous traction (stable release of central VMT and stable resolution of central cystic changes, partial PVD).   Notes *Images captured and stored on drive  Diagnosis / Impression:  Non-exu ARMD OU VMT with central cystic changes OU OD: VMT with mild  interval improvement in cystic changes--VMT releasing OS: stable release of central VMT and stable resolution of central cystic changes, partial PVD  Clinical management:  See below  Abbreviations: NFP - Normal foveal profile. CME - cystoid macular edema. PED - pigment epithelial detachment. IRF - intraretinal fluid. SRF - subretinal fluid. EZ - ellipsoid zone. ERM - epiretinal membrane. ORA - outer retinal atrophy. ORT - outer retinal tubulation. SRHM - subretinal hyper-reflective material             ASSESSMENT/PLAN:    ICD-10-CM   1. Vitreomacular adhesion of both eyes  H43.823     2. Intermediate stage nonexudative age-related macular degeneration of both eyes  H35.3132 OCT, Retina - OU - Both Eyes    3. Essential hypertension  I10     4. Hypertensive retinopathy of both eyes  H35.033     5. Pseudophakia, both eyes  Z96.1     6. Primary open angle glaucoma of both eyes, unspecified glaucoma stage  H40.1130     7. Dry eyes  H04.123       1. VMT with central cystic changes OU  - BCVA OD 20/25 from 20/40; OS 40 from 20/60  - asymptomatic, no metamorphopsia - OCT shows OD: VMT with mild interval improvement in cystic changes--VMT releasing; OS: stable release of central VMT and stable resolution of central cystic changes, partial PVD - discussed possibility of spontaneous release of VMT vs progression to macular hole  - no indication for surgery at this time and pt wishes to monitor for  now -- reasonable  - f/u 6 months, sooner prn - repeat DFE/OCT  2. Age related macular degeneration, non-exudative, both eyes  - stable intermediate stage disease - The incidence, anatomy, and pathology of dry AMD, risk of progression, and the AREDS and AREDS 2 study including smoking risks discussed with patient. Patient was instructed to continue taking AREDS despite her not liking to take pills.    - recommend Amsler grid monitoring  3,4. Hypertensive retinopathy OU  - discussed  importance of tight BP control  - monitor  5. Pseudophakia OU  - s/p CE/IOL (OS: 09.01.21, OD: 09.15.21, Dr. Lelon)  - IOLs in good position, doing well  - monitor  6. POAG OU  - under the expert management of Dr. Fleeta  - IOP today: 14 OU - on Rhopressa every day OU, brimonidine BID OS managed by Dr. Fleeta.   - ?SLT w/ Dr. Fleeta  7. Dry eyes OU  - recommend artificial tears and lubricating ointment as needed  - using Meibo qid OU per Dr. Fleeta  Ophthalmic Meds Ordered this visit:  No orders of the defined types were placed in this encounter.    Return in about 6 months (around 03/15/2024) for f/u VMT OU, DFE, OCT.  This document serves as a record of services personally performed by Redell JUDITHANN Hans, MD, PhD. It was created on their behalf by Auston Muzzy, COMT. The creation of this record is the provider's dictation and/or activities during the visit.  Electronically signed by: Auston Muzzy, COMT 09/16/23 1:23 AM  This document serves as a record of services personally performed by Redell JUDITHANN Hans, MD, PhD. It was created on their behalf by Alan PARAS. Delores, OA an ophthalmic technician. The creation of this record is the provider's dictation and/or activities during the visit.    Electronically signed by: Alan PARAS. Delores, OA 09/16/23 1:23 AM  Redell JUDITHANN Hans, M.D., Ph.D. Diseases & Surgery of the Retina and Vitreous [facility]  I have reviewed the above documentation for accuracy and completeness, and I agree with the above. Redell JUDITHANN Hans, M.D., Ph.D. 09/16/23 1:25 AM   Abbreviations: M myopia (nearsighted); A astigmatism; H hyperopia (farsighted); P presbyopia; Mrx spectacle prescription;  CTL contact lenses; OD right eye; OS left eye; OU both eyes  XT exotropia; ET esotropia; PEK punctate epithelial keratitis; PEE punctate epithelial erosions; DES dry eye syndrome; MGD meibomian gland dysfunction; ATs artificial tears; PFAT's preservative free artificial  tears; NSC nuclear sclerotic cataract; PSC posterior subcapsular cataract; ERM epi-retinal membrane; PVD posterior vitreous detachment; RD retinal detachment; DM diabetes mellitus; DR diabetic retinopathy; NPDR non-proliferative diabetic retinopathy; PDR proliferative diabetic retinopathy; CSME clinically significant macular edema; DME diabetic macular edema; dbh dot blot hemorrhages; CWS cotton wool spot; POAG primary open angle glaucoma; C/D cup-to-disc ratio; HVF humphrey visual field; GVF goldmann visual field; OCT optical coherence tomography; IOP intraocular pressure; BRVO Branch retinal vein occlusion; CRVO central retinal vein occlusion; CRAO central retinal artery occlusion; BRAO branch retinal artery occlusion; RT retinal tear; SB scleral buckle; PPV pars plana vitrectomy; VH Vitreous hemorrhage; PRP panretinal laser photocoagulation; IVK intravitreal kenalog ; VMT vitreomacular traction; MH Macular hole;  NVD neovascularization of the disc; NVE neovascularization elsewhere; AREDS age related eye disease study; ARMD age related macular degeneration; POAG primary open angle glaucoma; EBMD epithelial/anterior basement membrane dystrophy; ACIOL anterior chamber intraocular lens; IOL intraocular lens; PCIOL posterior chamber intraocular lens; Phaco/IOL phacoemulsification with intraocular lens placement; PRK photorefractive keratectomy; LASIK laser assisted  in situ keratomileusis; HTN hypertension; DM diabetes mellitus; COPD chronic obstructive pulmonary disease

## 2023-09-12 ENCOUNTER — Ambulatory Visit: Payer: Self-pay | Admitting: Family

## 2023-09-13 ENCOUNTER — Ambulatory Visit (INDEPENDENT_AMBULATORY_CARE_PROVIDER_SITE_OTHER): Payer: Medicare Other | Admitting: Ophthalmology

## 2023-09-13 ENCOUNTER — Encounter (INDEPENDENT_AMBULATORY_CARE_PROVIDER_SITE_OTHER): Payer: Self-pay | Admitting: Ophthalmology

## 2023-09-13 DIAGNOSIS — H43823 Vitreomacular adhesion, bilateral: Secondary | ICD-10-CM

## 2023-09-13 DIAGNOSIS — Z961 Presence of intraocular lens: Secondary | ICD-10-CM

## 2023-09-13 DIAGNOSIS — H401130 Primary open-angle glaucoma, bilateral, stage unspecified: Secondary | ICD-10-CM | POA: Diagnosis not present

## 2023-09-13 DIAGNOSIS — H04123 Dry eye syndrome of bilateral lacrimal glands: Secondary | ICD-10-CM

## 2023-09-13 DIAGNOSIS — H35033 Hypertensive retinopathy, bilateral: Secondary | ICD-10-CM | POA: Diagnosis not present

## 2023-09-13 DIAGNOSIS — I10 Essential (primary) hypertension: Secondary | ICD-10-CM | POA: Diagnosis not present

## 2023-09-13 DIAGNOSIS — H353132 Nonexudative age-related macular degeneration, bilateral, intermediate dry stage: Secondary | ICD-10-CM

## 2023-09-16 ENCOUNTER — Encounter (INDEPENDENT_AMBULATORY_CARE_PROVIDER_SITE_OTHER): Payer: Self-pay | Admitting: Ophthalmology

## 2023-09-16 ENCOUNTER — Encounter: Payer: Self-pay | Admitting: Family

## 2023-09-16 ENCOUNTER — Other Ambulatory Visit: Payer: Self-pay | Admitting: Family

## 2023-09-16 DIAGNOSIS — M65342 Trigger finger, left ring finger: Secondary | ICD-10-CM | POA: Diagnosis not present

## 2023-09-16 MED ORDER — ROSUVASTATIN CALCIUM 5 MG PO TABS: 5.0000 mg | ORAL_TABLET | Freq: Every day | ORAL | 3 refills | Status: AC

## 2023-10-06 DIAGNOSIS — R928 Other abnormal and inconclusive findings on diagnostic imaging of breast: Secondary | ICD-10-CM | POA: Diagnosis not present

## 2023-10-06 DIAGNOSIS — R92333 Mammographic heterogeneous density, bilateral breasts: Secondary | ICD-10-CM | POA: Diagnosis not present

## 2023-10-18 ENCOUNTER — Inpatient Hospital Stay: Payer: Medicare Other | Attending: Adult Health | Admitting: Adult Health

## 2023-10-18 VITALS — BP 122/79 | HR 99 | Temp 97.6°F | Resp 18 | Ht 61.0 in | Wt 150.0 lb

## 2023-10-18 DIAGNOSIS — Z8349 Family history of other endocrine, nutritional and metabolic diseases: Secondary | ICD-10-CM | POA: Diagnosis not present

## 2023-10-18 DIAGNOSIS — Z818 Family history of other mental and behavioral disorders: Secondary | ICD-10-CM | POA: Insufficient documentation

## 2023-10-18 DIAGNOSIS — Z90710 Acquired absence of both cervix and uterus: Secondary | ICD-10-CM | POA: Insufficient documentation

## 2023-10-18 DIAGNOSIS — D0512 Intraductal carcinoma in situ of left breast: Secondary | ICD-10-CM | POA: Insufficient documentation

## 2023-10-18 DIAGNOSIS — Z8379 Family history of other diseases of the digestive system: Secondary | ICD-10-CM | POA: Insufficient documentation

## 2023-10-18 DIAGNOSIS — Z9049 Acquired absence of other specified parts of digestive tract: Secondary | ICD-10-CM | POA: Insufficient documentation

## 2023-10-18 DIAGNOSIS — Z808 Family history of malignant neoplasm of other organs or systems: Secondary | ICD-10-CM | POA: Insufficient documentation

## 2023-10-18 DIAGNOSIS — Z83511 Family history of glaucoma: Secondary | ICD-10-CM | POA: Diagnosis not present

## 2023-10-18 DIAGNOSIS — Z79899 Other long term (current) drug therapy: Secondary | ICD-10-CM | POA: Insufficient documentation

## 2023-10-18 DIAGNOSIS — Z860100 Personal history of colon polyps, unspecified: Secondary | ICD-10-CM | POA: Diagnosis not present

## 2023-10-18 DIAGNOSIS — M549 Dorsalgia, unspecified: Secondary | ICD-10-CM | POA: Diagnosis not present

## 2023-10-18 DIAGNOSIS — Z8249 Family history of ischemic heart disease and other diseases of the circulatory system: Secondary | ICD-10-CM | POA: Insufficient documentation

## 2023-10-18 DIAGNOSIS — E7800 Pure hypercholesterolemia, unspecified: Secondary | ICD-10-CM | POA: Insufficient documentation

## 2023-10-18 DIAGNOSIS — Z801 Family history of malignant neoplasm of trachea, bronchus and lung: Secondary | ICD-10-CM | POA: Diagnosis not present

## 2023-10-18 DIAGNOSIS — Z87891 Personal history of nicotine dependence: Secondary | ICD-10-CM | POA: Insufficient documentation

## 2023-10-18 NOTE — Progress Notes (Unsigned)
 [facility] Cancer Follow up:    Ruth, Viswanathan, FNP [address] [place] [address]   DIAGNOSIS: Cancer Staging  Ductal carcinoma in situ (DCIS) of left breast Staging form: Breast, AJCC 8th Edition - Clinical: Stage 0 (cTis (DCIS), cN0, cM0, G2, ER+, PR+, HER2: Not Assessed) - Signed by Odean Potts, MD on 09/30/2021 Stage prefix: Initial diagnosis Histologic grading system: 3 grade system    SUMMARY OF ONCOLOGIC HISTORY: Oncology History  Ductal carcinoma in situ (DCIS) of left breast  09/22/2021 Initial Diagnosis   Screening mammogram detected left breast calcifications UOQ 1 cm, stereotactic biopsy revealed intermediate grade DCIS, ER/PR positive   09/30/2021 Cancer Staging   Staging form: Breast, AJCC 8th Edition - Clinical: Stage 0 (cTis (DCIS), cN0, cM0, G2, ER+, PR+, HER2: Not Assessed) - Signed by Odean Potts, MD on 09/30/2021 Stage prefix: Initial diagnosis Histologic grading system: 3 grade system   10/28/2021 Surgery   Left lumpectomy: Area of fibrosis, fat necrosis and hemorrhage consistent with the biopsy changes, benign, no residual DCIS identified   10/2021 -  Anti-estrogen oral therapy   Tamoxifen  5 years--opted to forego Tamoxifen , declines other antiestrogen therapy at this time     CURRENT THERAPY:  INTERVAL HISTORY:  Discussed the use of AI scribe software for clinical note transcription with the patient, who gave verbal consent to proceed.  Ruth Wolfe 78 y.o. 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 sulfonamide containing compounds and sulfa antibiotics.  MEDICAL HISTORY: Past Medical History:  Diagnosis Date   Allergy  2013   Arthritis 1980   Asthma 2000   Breast cancer (HCC) 09/22/2021   DCIS   Bruit    Per patient   Cataracts, both eyes 2021   surgery 2022   Colon polyps    Environmental allergies 2013   Fibromyalgia    GERD (gastroesophageal reflux disease) 2013   Glaucoma 2016   POAG OU   History of hiatal hernia    Hyperlipidemia 2014   Hypertensive retinopathy 2021   OU   Macular degeneration 2014   Neuropathy 2013   legs and arms   Osteopenia 2000   Osteoporosis    Paralyzed vocal cords 2013   Per patient   Tongue atrophy 2013   Per patient r/t paralyzed vocal cords   Vitamin B12 deficiency 2021   Vitamin D  deficiency 2021    SURGICAL HISTORY: Past Surgical History:  Procedure Laterality Date   APPENDECTOMY  1981   with hysterectomy   BREAST LUMPECTOMY WITH RADIOACTIVE SEED LOCALIZATION Left 10/28/2021   Procedure: LEFT BREAST SEED LUMPECTOMY;  Surgeon: Vanderbilt Ned, MD;  Location: [facility] OR;  Service: General;  Laterality: Left;   CATARACT EXTRACTION Bilateral    CERVICAL SPINE SURGERY  01/30/2020   Per patient   IR SACROPLASTY BILATERAL  07/26/2023   TOTAL ABDOMINAL HYSTERECTOMY  1981   TOTAL HIP ARTHROPLASTY Left 03/21/2023   Procedure: TOTAL HIP ARTHROPLASTY ANTERIOR APPROACH;  Surgeon: Ernie Cough, MD;  Location: [facility];  Service: Orthopedics;  Laterality: Left;   UPPER GASTROINTESTINAL ENDOSCOPY  12/10/2022    SOCIAL HISTORY: Social History    Socioeconomic History   Marital status: Divorced    Spouse name: Not on file   Number of children: 0   Years of education: Not on file   Highest education level: Bachelor's degree (e.g., BA, AB, BS)  Occupational History   Occupation: retired  Tobacco Use   Smoking status: Former    Types: Cigarettes   Smokeless tobacco: Never   Tobacco comments:    College years  Vaping Use   Vaping status: Never Used  Substance and Sexual Activity   Alcohol use: Yes    Alcohol/week: 2.0 standard drinks of alcohol    Types: 1 Glasses of wine, 1 Cans of beer per week    Comment: 1 per day   Drug use: No   Sexual activity: Not on file  Other Topics Concern   Not on file  Social History Narrative   Not on file   Social Drivers of Health   Financial Resource Strain: Low Risk  (08/16/2023)   Overall Financial Resource Strain (CARDIA)    Difficulty of Paying Living Expenses: Not hard at all  Food Insecurity: No Food Insecurity (08/16/2023)   Hunger Vital Sign    Worried About Running Out of Food in the Last Year: Never true    Ran Out of Food in the Last Year: Never true  Transportation Needs: No Transportation Needs (08/16/2023)   PRAPARE - Administrator, Civil Service (Medical): No    Lack of Transportation (Non-Medical): No  Physical Activity: Inactive (08/16/2023)   Exercise Vital Sign    Days of Exercise per Week: 0 days    Minutes of Exercise per Session: Not on file  Stress: No Stress Concern Present (08/16/2023)   [employer] of Occupational Health - Occupational Stress Questionnaire    Feeling of Stress: Not at all  Social Connections: Moderately Integrated (08/16/2023)   Social Connection and Isolation Panel    Frequency of Communication with Friends and Family: More than three times a week    Frequency of Social Gatherings with Friends and Family: More than three times a week    Attends Religious Services: More than 4 times per year    Active Member of [employer]  or Organizations: Yes    Attends Banker Meetings: Never    Marital Status: Divorced  Catering manager Violence: Not At Risk (08/09/2023)   Humiliation, Afraid, Rape, and Kick questionnaire    Fear of Current or Ex-Partner: No    Emotionally Abused: No    Physically Abused: No    Sexually Abused: No    FAMILY HISTORY: Family History  Problem Relation Age of Onset   Hypertension Mother    Lung cancer Father        he smoked   Glaucoma Father    Gout Father    Alzheimer's disease Father    Hypertension Sister    Liver disease Sister    Brain cancer Cousin        glioblastoma, paternal first cousin   Colon cancer Neg  Hx    Stomach cancer Neg Hx     Review of Systems - Oncology    PHYSICAL EXAMINATION   Onc Performance Status - 10/18/23 1053       ECOG Perf Status   ECOG Perf Status Restricted in physically strenuous activity but ambulatory and able to carry out work of a light or sedentary nature, e.g., light house work, office work      KPS SCALE   KPS % SCORE Able to carry on normal activity, minor s/s of disease          Vitals:   10/18/23 1044  BP: 122/79  Pulse: 99  Resp: 18  Temp: 97.6 F (36.4 C)  SpO2: 99%    Physical Exam  LABORATORY DATA:  CBC    Component Value Date/Time   WBC 8.3 08/23/2023 1057   RBC 3.94 08/23/2023 1057   HGB 12.8 08/23/2023 1057   HGB 13.6 09/30/2021 1209   HCT 39.1 08/23/2023 1057   PLT 271.0 08/23/2023 1057   PLT 250 09/30/2021 1209   MCV 99.2 08/23/2023 1057   MCV 95.6 04/04/2015 1023   MCH 32.8 07/26/2023 0857   MCHC 32.9 08/23/2023 1057   RDW 14.8 08/23/2023 1057   LYMPHSABS 1.5 08/23/2023 1057   MONOABS 0.9 08/23/2023 1057   EOSABS 0.5 08/23/2023 1057   BASOSABS 0.0 08/23/2023 1057    CMP     Component Value Date/Time   NA 138 08/23/2023 1057   K 4.5 08/23/2023 1057   CL 100 08/23/2023 1057   CO2 30 08/23/2023 1057   GLUCOSE 88 08/23/2023 1057   BUN 13 08/23/2023 1057   CREATININE  0.71 08/23/2023 1057   CREATININE 0.81 09/30/2021 1230   CALCIUM  9.7 08/23/2023 1057   PROT 7.0 08/23/2023 1057   ALBUMIN 4.4 08/23/2023 1057   AST 17 08/23/2023 1057   AST 17 09/30/2021 1230   ALT 12 08/23/2023 1057   ALT 15 09/30/2021 1230   ALKPHOS 95 08/23/2023 1057   BILITOT 1.1 08/23/2023 1057   BILITOT 1.1 09/30/2021 1230   GFRNONAA >60 07/26/2023 0857   GFRNONAA >60 09/30/2021 1230     ASSESSMENT and THERAPY PLAN:   No problem-specific Assessment & Plan notes found for this encounter.     All questions were answered. The patient knows to call the clinic with any problems, questions or concerns. We can certainly see the patient much sooner if necessary.  Total encounter time:*** minutes*in face-to-face visit time, chart review, lab review, care coordination, order entry, and documentation of the encounter time.    Ruth Kendall, NP 10/18/23 11:06 AM Medical Oncology and Hematology [facility] [address] [place], [place] [address] Tel. [phone]    Fax. [phone]  *Total Encounter Time as defined by the Centers for Medicare and Medicaid Services includes, in addition to the face-to-face time of a patient visit (documented in the note above) non-face-to-face time: obtaining and reviewing outside history, ordering and reviewing medications, tests or procedures, care coordination (communications with other health care professionals or caregivers) and documentation in the medical record.

## 2023-10-21 DIAGNOSIS — H401111 Primary open-angle glaucoma, right eye, mild stage: Secondary | ICD-10-CM | POA: Diagnosis not present

## 2023-10-21 DIAGNOSIS — H401122 Primary open-angle glaucoma, left eye, moderate stage: Secondary | ICD-10-CM | POA: Diagnosis not present

## 2023-11-15 ENCOUNTER — Other Ambulatory Visit (HOSPITAL_COMMUNITY): Payer: Self-pay

## 2023-11-23 DIAGNOSIS — M47812 Spondylosis without myelopathy or radiculopathy, cervical region: Secondary | ICD-10-CM | POA: Diagnosis not present

## 2023-11-23 DIAGNOSIS — M48062 Spinal stenosis, lumbar region with neurogenic claudication: Secondary | ICD-10-CM | POA: Diagnosis not present

## 2023-11-23 DIAGNOSIS — Z6827 Body mass index (BMI) 27.0-27.9, adult: Secondary | ICD-10-CM | POA: Diagnosis not present

## 2023-11-28 DIAGNOSIS — M47812 Spondylosis without myelopathy or radiculopathy, cervical region: Secondary | ICD-10-CM | POA: Diagnosis not present

## 2023-12-06 DIAGNOSIS — M5116 Intervertebral disc disorders with radiculopathy, lumbar region: Secondary | ICD-10-CM | POA: Diagnosis not present

## 2023-12-06 DIAGNOSIS — M5416 Radiculopathy, lumbar region: Secondary | ICD-10-CM | POA: Diagnosis not present

## 2023-12-20 DIAGNOSIS — M47812 Spondylosis without myelopathy or radiculopathy, cervical region: Secondary | ICD-10-CM | POA: Diagnosis not present

## 2024-01-10 DIAGNOSIS — Z23 Encounter for immunization: Secondary | ICD-10-CM | POA: Diagnosis not present

## 2024-02-15 DIAGNOSIS — M47812 Spondylosis without myelopathy or radiculopathy, cervical region: Secondary | ICD-10-CM | POA: Diagnosis not present

## 2024-03-02 NOTE — Progress Notes (Signed)
 " [facility] - Clinic Note  03/13/2024     CHIEF COMPLAINT Patient presents for Retina Follow Up    HISTORY OF PRESENT ILLNESS: Ruth Wolfe is a 79 y.o. female who presents to the clinic today for:   HPI     Retina Follow Up   Patient presents with  Other.  In both eyes.  Severity is moderate.  Duration of 6 months.  Since onset it is gradually worsening.  I, the attending physician,  performed the HPI with the patient and updated documentation appropriately.        Comments   6 month Retina eval. Patient states she has trouble reading recipes. Has to use a magnifier      Last edited by Valdemar Rogue, MD on 03/22/2024 10:08 PM.     Patient states vision seems unchanged  Referring physician: Robinson Idol, MD [address], [address] [place],  [place] [address]  HISTORICAL INFORMATION:  Selected notes from the MEDICAL RECORD NUMBER Referred by Dr. Medford Ferrier for concern of VMT OU LEE: 03.18.21 MICHAELL Ferrier) [BCVA: OD: 20/30-- OS: 20/50] Ocular Hx-POAG OU, VMT OU, DES OU, cataracts OU, HTN ret OU PMH-HLD   CURRENT MEDICATIONS: Current Outpatient Medications (Ophthalmic Drugs)  Medication Sig   VYZULTA 0.024 % SOLN Place 1 drop into the left eye daily.   No current facility-administered medications for this visit. (Ophthalmic Drugs)   Current Outpatient Medications (Other)  Medication Sig   acetaminophen  (TYLENOL ) 500 MG tablet Take 500 mg by mouth every 8 (eight) hours as needed for moderate pain (pain score 4-6).   Cholecalciferol (D3 2000 PO) Take 2 capsules by mouth daily.   cyanocobalamin  (VITAMIN B12) 1000 MCG tablet Take 1,000 mcg by mouth daily.   famotidine  (PEPCID ) 40 MG tablet Take 40 mg by mouth daily.   fluticasone  (FLONASE ) 50 MCG/ACT nasal spray SPRAY 2 SPRAYS INTO EACH NOSTRIL EVERY DAY   fluticasone -salmeterol (WIXELA INHUB) 250-50 MCG/ACT AEPB Inhale 1 puff into the lungs in the morning and at bedtime.   gabapentin  (NEURONTIN ) 300 MG  capsule Take 300 mg by mouth 3 (three) times daily.   Multiple Vitamins-Minerals (PRESERVISION AREDS 2 PO) Take 1 capsule by mouth in the morning and at bedtime.   omeprazole  (PRILOSEC) 40 MG capsule Take 40 mg by mouth daily.   rosuvastatin  (CRESTOR ) 5 MG tablet Take 1 tablet (5 mg total) by mouth daily.   No current facility-administered medications for this visit. (Other)   REVIEW OF SYSTEMS: ROS   Positive for: Gastrointestinal, Musculoskeletal, Eyes, Respiratory Negative for: Constitutional, Neurological, Skin, Genitourinary, HENT, Endocrine, Cardiovascular, Psychiatric, Allergic/Imm, Heme/Lymph Last edited by German Olam BRAVO, COT on 03/13/2024  1:49 PM.     ALLERGIES Allergies  Allergen Reactions   Misc. Sulfonamide Containing Compounds Other (See Comments)   Sulfa Antibiotics     Wheezing, swelling face and ears   PAST MEDICAL HISTORY Past Medical History:  Diagnosis Date   Allergy  2013   Arthritis 1980   Asthma 2000   Breast cancer (HCC) 09/22/2021   DCIS   Bruit    Per patient   Cataracts, both eyes 2021   surgery 2022   Colon polyps    Environmental allergies 2013   Fibromyalgia    GERD (gastroesophageal reflux disease) 2013   Glaucoma 2016   POAG OU   History of hiatal hernia    Hyperlipidemia 2014   Hypertensive retinopathy 2021   OU   Macular degeneration 2014  Neuropathy 2013   legs and arms   Osteopenia 2000   Osteoporosis    Paralyzed vocal cords 2013   Per patient   Tongue atrophy 2013   Per patient r/t paralyzed vocal cords   Vitamin B12 deficiency 2021   Vitamin D  deficiency 2021   Past Surgical History:  Procedure Laterality Date   APPENDECTOMY  1981   with hysterectomy   BREAST LUMPECTOMY WITH RADIOACTIVE SEED LOCALIZATION Left 10/28/2021   Procedure: LEFT BREAST SEED LUMPECTOMY;  Surgeon: Vanderbilt Ned, MD;  Location: [facility] OR;  Service: General;  Laterality: Left;   CATARACT EXTRACTION Bilateral    CERVICAL SPINE SURGERY  01/30/2020    Per patient   IR SACROPLASTY BILATERAL  07/26/2023   TOTAL ABDOMINAL HYSTERECTOMY  1981   TOTAL HIP ARTHROPLASTY Left 03/21/2023   Procedure: TOTAL HIP ARTHROPLASTY ANTERIOR APPROACH;  Surgeon: Ernie Cough, MD;  Location: [facility];  Service: Orthopedics;  Laterality: Left;   UPPER GASTROINTESTINAL ENDOSCOPY  12/10/2022   FAMILY HISTORY Family History  Problem Relation Age of Onset   Hypertension Mother    Lung cancer Father        he smoked   Glaucoma Father    Gout Father    Alzheimer's disease Father    Hypertension Sister    Liver disease Sister    Brain cancer Cousin        glioblastoma, paternal first cousin   Colon cancer Neg Hx    Stomach cancer Neg Hx    SOCIAL HISTORY Social History   Tobacco Use   Smoking status: Former    Types: Cigarettes   Smokeless tobacco: Never   Tobacco comments:    College years  Vaping Use   Vaping status: Never Used  Substance Use Topics   Alcohol use: Yes    Alcohol/week: 2.0 standard drinks of alcohol    Types: 1 Glasses of wine, 1 Cans of beer per week    Comment: 1 per day   Drug use: No       OPHTHALMIC EXAM:  Base Eye Exam     Visual Acuity (Snellen - Linear)       Right Left   Dist cc 20/25 -2 20/40 -1   Dist ph cc 20/NI 20/NI    Correction: Glasses         Tonometry (Tonopen, 1:59 PM)       Right Left   Pressure 12 8         Pupils       Dark Light Shape React APD   Right 3 2 Round Brisk None   Left 3 2 Round Brisk None         Visual Fields (Counting fingers)       Left Right    Full Full         Extraocular Movement       Right Left    Full, Ortho Full, Ortho         Neuro/Psych     Oriented x3: Yes   Mood/Affect: Normal         Dilation     Both eyes: 1.0% Mydriacyl, 2.5% Phenylephrine  @ 2:00 PM           Slit Lamp and Fundus Exam     Slit Lamp Exam       Right Left   Lids/Lashes Mild Dermatochalasis - upper lid, mild MGD Mild Dermatochalasis - upper lid    Conjunctiva/Sclera White and quiet,1+ injection White  and quiet   Cornea Arcus, 3+ Punctate epithelial erosions, well healed temporal cataract wounds Arcus, 2+ Punctate epithelial erosions, fine endo pigment, well healed cataract wounds, tear film debris   Anterior Chamber deep and clear, narrow temporal angle, no cell or flare deep and clear, narrow temporal angle   Iris Round and dilated Round and dilated   Lens PC IOL in good position PC IOL in good position   Anterior Vitreous Posterior vitreous detachment, vitreous syneresis mild syneresis         Fundus Exam       Right Left   Disc Tilted disc, +cupping, temporal PPA, Sharp rim, thin inferior rim Tilted disc, +cupping, temporal PPA, mild Pallor, Sharp rim   C/D Ratio 0.8 0.7   Macula Flat, Blunted foveal reflex, central Cystic changes -- improved, Drusen, RPE mottling and clumping, no heme Flat, Blunted foveal reflex, central macular cyst -- stably improved, Drusen, RPE mottling and clumping, no heme, central vitelliform like lesion -- improved to focal pigment clump   Vessels attenuated, mild tortuosity attenuated, Tortuous   Periphery Attached, peripheral drusen, reticular degeneration, No heme Attached, peripheral drusen, reticular degeneration, No heme           Refraction     Wearing Rx       Sphere Cylinder Axis Add   Right -1.00 +1.25 022 +2.50   Left -1.50 +2.50 158 +2.50    Type: PAL           IMAGING AND PROCEDURES  Imaging and Procedures for @TODAY @  OCT, Retina - OU - Both Eyes       Right Eye Quality was borderline. Central Foveal Thickness: 268. Progression has improved. Findings include no SRF, abnormal foveal contour, myopic contour, retinal drusen , intraretinal fluid, vitreous traction (VMT with mild interval improvement in cystic changes -- VMT releasing).   Left Eye Quality was good. Central Foveal Thickness: 230. Progression has been stable. Findings include normal foveal contour, no IRF, no  SRF, retinal drusen (stable release of central VMT and stable resolution of central cystic changes, partial PVD).   Notes *Images captured and stored on drive  Diagnosis / Impression:  Non-exu ARMD OU VMT with central cystic changes OU OD: VMT with mild interval improvement in cystic changes--VMT releasing OS: stable release of central VMT and stable resolution of central cystic changes, partial PVD  Clinical management:  See below  Abbreviations: NFP - Normal foveal profile. CME - cystoid macular edema. PED - pigment epithelial detachment. IRF - intraretinal fluid. SRF - subretinal fluid. EZ - ellipsoid zone. ERM - epiretinal membrane. ORA - outer retinal atrophy. ORT - outer retinal tubulation. SRHM - subretinal hyper-reflective material            ASSESSMENT/PLAN:    ICD-10-CM   1. Vitreomacular adhesion of both eyes  H43.823 OCT, Retina - OU - Both Eyes    2. Intermediate stage nonexudative age-related macular degeneration of both eyes  H35.3132     3. Essential hypertension  I10     4. Hypertensive retinopathy of both eyes  H35.033     5. Pseudophakia, both eyes  Z96.1     6. Primary open angle glaucoma of both eyes, unspecified glaucoma stage  H40.1130     7. Dry eyes  H04.123      1. VMT with central cystic changes OU  - BCVA OD 20/25-stable; OS 20/40 -stable  - asymptomatic, no metamorphopsia - OCT shows OD: VMT with mild interval improvement  in cystic changes--VMT releasing; OS: stable release of central VMT and stable resolution of central cystic changes, partial PVD - discussed possibility of spontaneous release of VMT vs progression to macular hole  - no indication for surgery at this time and pt wishes to monitor for now -- reasonable  - f/u 6 months, sooner prn - repeat DFE/OCT  2. Age related macular degeneration, non-exudative, both eyes  - stable intermediate stage disease - The incidence, anatomy, and pathology of dry AMD, risk of progression, and  the AREDS and AREDS 2 study including smoking risks discussed with patient. Patient was instructed to continue taking AREDS despite her not liking to take pills.    - recommend Amsler grid monitoring  3,4. Hypertensive retinopathy OU  - discussed importance of tight BP control  - monitor  5. Pseudophakia OU  - s/p CE/IOL (OS: 09.01.21, OD: 09.15.21, Dr. Lelon)  - IOLs in good position, doing well  - monitor  6. POAG OU  - under the expert management of Dr. Fleeta  - IOP today: 12,8 - on Rhopressa every day OU, brimonidine BID OS managed by Dr. Fleeta.   - ?SLT w/ Dr. Fleeta  7. Dry eyes OU  - recommend artificial tears and lubricating ointment as needed  - using Meibo qid OU per Dr. Fleeta  Ophthalmic Meds Ordered this visit:  No orders of the defined types were placed in this encounter.    Return in about 6 months (around 09/10/2024) for VMT OU, DFE, OCT.  This document serves as a record of services personally performed by Redell JUDITHANN Hans, MD, PhD. It was created on their behalf by Wanda GEANNIE Keens, COT an ophthalmic technician. The creation of this record is the provider's dictation and/or activities during the visit.    Electronically signed by:  Wanda GEANNIE Keens, COT  03/22/24 10:28 PM  This document serves as a record of services personally performed by Redell JUDITHANN Hans, MD, PhD. It was created on their behalf by Paulina Jamse Gay an ophthalmic technician. The creation of this record is the provider's dictation and/or activities during the visit.   Electronically signed by: Alana D Fowler  03/22/24  10:28 PM   This document serves as a record of services personally performed by Redell JUDITHANN Hans, MD, PhD. It was created on their behalf by Almetta Pesa, an ophthalmic technician. The creation of this record is the provider's dictation and/or activities during the visit.    Electronically signed by: Almetta Pesa, OA, 03/22/24  10:28 PM  Redell JUDITHANN Hans, M.D.,  Ph.D. Diseases & Surgery of the Retina and Vitreous [facility]  I have reviewed the above documentation for accuracy and completeness, and I agree with the above. Redell JUDITHANN Hans, M.D., Ph.D. 03/22/24 10:35 PM   Abbreviations: M myopia (nearsighted); A astigmatism; H hyperopia (farsighted); P presbyopia; Mrx spectacle prescription;  CTL contact lenses; OD right eye; OS left eye; OU both eyes  XT exotropia; ET esotropia; PEK punctate epithelial keratitis; PEE punctate epithelial erosions; DES dry eye syndrome; MGD meibomian gland dysfunction; ATs artificial tears; PFAT's preservative free artificial tears; NSC nuclear sclerotic cataract; PSC posterior subcapsular cataract; ERM epi-retinal membrane; PVD posterior vitreous detachment; RD retinal detachment; DM diabetes mellitus; DR diabetic retinopathy; NPDR non-proliferative diabetic retinopathy; PDR proliferative diabetic retinopathy; CSME clinically significant macular edema; DME diabetic macular edema; dbh dot blot hemorrhages; CWS cotton wool spot; POAG primary open angle glaucoma; C/D cup-to-disc ratio; HVF humphrey visual field; GVF goldmann  visual field; OCT optical coherence tomography; IOP intraocular pressure; BRVO Branch retinal vein occlusion; CRVO central retinal vein occlusion; CRAO central retinal artery occlusion; BRAO branch retinal artery occlusion; RT retinal tear; SB scleral buckle; PPV pars plana vitrectomy; VH Vitreous hemorrhage; PRP panretinal laser photocoagulation; IVK intravitreal kenalog ; VMT vitreomacular traction; MH Macular hole;  NVD neovascularization of the disc; NVE neovascularization elsewhere; AREDS age related eye disease study; ARMD age related macular degeneration; POAG primary open angle glaucoma; EBMD epithelial/anterior basement membrane dystrophy; ACIOL anterior chamber intraocular lens; IOL intraocular lens; PCIOL posterior chamber intraocular lens; Phaco/IOL phacoemulsification with  intraocular lens placement; PRK photorefractive keratectomy; LASIK laser assisted in situ keratomileusis; HTN hypertension; DM diabetes mellitus; COPD chronic obstructive pulmonary disease "

## 2024-03-13 ENCOUNTER — Ambulatory Visit (INDEPENDENT_AMBULATORY_CARE_PROVIDER_SITE_OTHER): Admitting: Ophthalmology

## 2024-03-13 ENCOUNTER — Encounter (INDEPENDENT_AMBULATORY_CARE_PROVIDER_SITE_OTHER): Payer: Self-pay | Admitting: Ophthalmology

## 2024-03-13 DIAGNOSIS — I10 Essential (primary) hypertension: Secondary | ICD-10-CM

## 2024-03-13 DIAGNOSIS — H35033 Hypertensive retinopathy, bilateral: Secondary | ICD-10-CM | POA: Diagnosis not present

## 2024-03-13 DIAGNOSIS — H04123 Dry eye syndrome of bilateral lacrimal glands: Secondary | ICD-10-CM | POA: Diagnosis not present

## 2024-03-13 DIAGNOSIS — Z961 Presence of intraocular lens: Secondary | ICD-10-CM | POA: Diagnosis not present

## 2024-03-13 DIAGNOSIS — H353132 Nonexudative age-related macular degeneration, bilateral, intermediate dry stage: Secondary | ICD-10-CM | POA: Diagnosis not present

## 2024-03-13 DIAGNOSIS — H401130 Primary open-angle glaucoma, bilateral, stage unspecified: Secondary | ICD-10-CM | POA: Diagnosis not present

## 2024-03-13 DIAGNOSIS — H43823 Vitreomacular adhesion, bilateral: Secondary | ICD-10-CM

## 2024-03-22 ENCOUNTER — Encounter (INDEPENDENT_AMBULATORY_CARE_PROVIDER_SITE_OTHER): Payer: Self-pay | Admitting: Ophthalmology

## 2024-03-30 ENCOUNTER — Other Ambulatory Visit: Payer: Self-pay | Admitting: Neurological Surgery

## 2024-03-30 DIAGNOSIS — M542 Cervicalgia: Secondary | ICD-10-CM

## 2024-04-03 ENCOUNTER — Ambulatory Visit: Admitting: Physician Assistant

## 2024-04-12 ENCOUNTER — Ambulatory Visit: Admitting: Physician Assistant

## 2024-04-18 ENCOUNTER — Other Ambulatory Visit

## 2024-08-14 ENCOUNTER — Ambulatory Visit

## 2024-09-11 ENCOUNTER — Encounter (INDEPENDENT_AMBULATORY_CARE_PROVIDER_SITE_OTHER): Admitting: Ophthalmology

## 2024-10-18 ENCOUNTER — Inpatient Hospital Stay: Admitting: Adult Health
